# Patient Record
Sex: Female | Born: 1950
Health system: Southern US, Community
[De-identification: ages and names within clinical notes are randomized; demographics above are authoritative.]

## PROBLEM LIST (undated history)

## (undated) DIAGNOSIS — E559 Vitamin D deficiency, unspecified: Secondary | ICD-10-CM

## (undated) DIAGNOSIS — I10 Essential (primary) hypertension: Secondary | ICD-10-CM

## (undated) DIAGNOSIS — R7303 Prediabetes: Secondary | ICD-10-CM

## (undated) DIAGNOSIS — E669 Obesity, unspecified: Secondary | ICD-10-CM

## (undated) HISTORY — PX: TUBAL LIGATION: SHX77

## (undated) HISTORY — PX: CARPAL TUNNEL RELEASE: SHX101

## (undated) HISTORY — DX: Obesity, unspecified: E66.9

## (undated) HISTORY — DX: Prediabetes: R73.03

## (undated) HISTORY — PX: KNEE ARTHROSCOPY: SUR90

## (undated) HISTORY — DX: Vitamin D deficiency, unspecified: E55.9

## (undated) HISTORY — DX: Essential (primary) hypertension: I10

---

## 1997-03-25 ENCOUNTER — Ambulatory Visit (HOSPITAL_COMMUNITY): Admission: RE | Admit: 1997-03-25 | Discharge: 1997-03-25 | Payer: Self-pay | Admitting: Internal Medicine

## 1997-08-08 ENCOUNTER — Ambulatory Visit (HOSPITAL_COMMUNITY): Admission: RE | Admit: 1997-08-08 | Discharge: 1997-08-08 | Payer: Self-pay | Admitting: Internal Medicine

## 1997-09-20 ENCOUNTER — Emergency Department (HOSPITAL_COMMUNITY): Admission: EM | Admit: 1997-09-20 | Discharge: 1997-09-20 | Payer: Self-pay | Admitting: Emergency Medicine

## 1997-09-20 ENCOUNTER — Encounter: Payer: Self-pay | Admitting: Emergency Medicine

## 1998-02-20 ENCOUNTER — Emergency Department (HOSPITAL_COMMUNITY): Admission: EM | Admit: 1998-02-20 | Discharge: 1998-02-20 | Payer: Self-pay | Admitting: Emergency Medicine

## 1999-11-09 ENCOUNTER — Ambulatory Visit (HOSPITAL_COMMUNITY): Admission: RE | Admit: 1999-11-09 | Discharge: 1999-11-09 | Payer: Self-pay | Admitting: Internal Medicine

## 1999-11-09 ENCOUNTER — Encounter: Payer: Self-pay | Admitting: Internal Medicine

## 2000-04-15 ENCOUNTER — Emergency Department (HOSPITAL_COMMUNITY): Admission: EM | Admit: 2000-04-15 | Discharge: 2000-04-16 | Payer: Self-pay | Admitting: Emergency Medicine

## 2000-06-19 ENCOUNTER — Ambulatory Visit (HOSPITAL_BASED_OUTPATIENT_CLINIC_OR_DEPARTMENT_OTHER): Admission: RE | Admit: 2000-06-19 | Discharge: 2000-06-19 | Payer: Self-pay | Admitting: Orthopedic Surgery

## 2000-07-22 ENCOUNTER — Ambulatory Visit (HOSPITAL_COMMUNITY): Admission: RE | Admit: 2000-07-22 | Discharge: 2000-07-22 | Payer: Self-pay | Admitting: Gastroenterology

## 2000-10-01 ENCOUNTER — Encounter: Admission: RE | Admit: 2000-10-01 | Discharge: 2000-10-01 | Payer: Self-pay | Admitting: Family Medicine

## 2000-10-01 ENCOUNTER — Encounter: Payer: Self-pay | Admitting: Family Medicine

## 2000-10-07 ENCOUNTER — Other Ambulatory Visit: Admission: RE | Admit: 2000-10-07 | Discharge: 2000-10-07 | Payer: Self-pay | Admitting: Family Medicine

## 2000-11-11 ENCOUNTER — Encounter: Payer: Self-pay | Admitting: Family Medicine

## 2000-11-11 ENCOUNTER — Ambulatory Visit (HOSPITAL_COMMUNITY): Admission: RE | Admit: 2000-11-11 | Discharge: 2000-11-11 | Payer: Self-pay | Admitting: Family Medicine

## 2001-10-30 ENCOUNTER — Other Ambulatory Visit: Admission: RE | Admit: 2001-10-30 | Discharge: 2001-10-30 | Payer: Self-pay | Admitting: Family Medicine

## 2001-11-02 ENCOUNTER — Encounter: Payer: Self-pay | Admitting: Family Medicine

## 2001-11-02 ENCOUNTER — Encounter: Admission: RE | Admit: 2001-11-02 | Discharge: 2001-11-02 | Payer: Self-pay | Admitting: Family Medicine

## 2002-01-08 ENCOUNTER — Ambulatory Visit (HOSPITAL_COMMUNITY): Admission: RE | Admit: 2002-01-08 | Discharge: 2002-01-08 | Payer: Self-pay | Admitting: Family Medicine

## 2002-01-08 ENCOUNTER — Encounter: Payer: Self-pay | Admitting: Family Medicine

## 2002-09-04 ENCOUNTER — Emergency Department (HOSPITAL_COMMUNITY): Admission: EM | Admit: 2002-09-04 | Discharge: 2002-09-04 | Payer: Self-pay | Admitting: Emergency Medicine

## 2002-09-04 ENCOUNTER — Encounter: Payer: Self-pay | Admitting: Emergency Medicine

## 2003-02-22 ENCOUNTER — Ambulatory Visit (HOSPITAL_COMMUNITY): Admission: RE | Admit: 2003-02-22 | Discharge: 2003-02-22 | Payer: Self-pay | Admitting: Family Medicine

## 2003-07-13 ENCOUNTER — Emergency Department (HOSPITAL_COMMUNITY): Admission: EM | Admit: 2003-07-13 | Discharge: 2003-07-13 | Payer: Self-pay | Admitting: Emergency Medicine

## 2003-07-14 ENCOUNTER — Ambulatory Visit (HOSPITAL_COMMUNITY): Admission: RE | Admit: 2003-07-14 | Discharge: 2003-07-14 | Payer: Self-pay | Admitting: Family Medicine

## 2004-02-02 ENCOUNTER — Emergency Department (HOSPITAL_COMMUNITY): Admission: EM | Admit: 2004-02-02 | Discharge: 2004-02-02 | Payer: Self-pay | Admitting: Emergency Medicine

## 2004-02-23 ENCOUNTER — Other Ambulatory Visit: Admission: RE | Admit: 2004-02-23 | Discharge: 2004-02-23 | Payer: Self-pay | Admitting: Internal Medicine

## 2004-02-23 ENCOUNTER — Ambulatory Visit (HOSPITAL_COMMUNITY): Admission: RE | Admit: 2004-02-23 | Discharge: 2004-02-23 | Payer: Self-pay | Admitting: Internal Medicine

## 2005-03-06 ENCOUNTER — Ambulatory Visit (HOSPITAL_COMMUNITY): Admission: RE | Admit: 2005-03-06 | Discharge: 2005-03-06 | Payer: Self-pay | Admitting: Internal Medicine

## 2005-04-25 ENCOUNTER — Ambulatory Visit: Payer: Self-pay | Admitting: Internal Medicine

## 2005-05-13 ENCOUNTER — Ambulatory Visit: Payer: Self-pay

## 2006-03-19 ENCOUNTER — Ambulatory Visit (HOSPITAL_COMMUNITY): Admission: RE | Admit: 2006-03-19 | Discharge: 2006-03-19 | Payer: Self-pay | Admitting: Internal Medicine

## 2006-06-11 ENCOUNTER — Ambulatory Visit (HOSPITAL_COMMUNITY): Admission: RE | Admit: 2006-06-11 | Discharge: 2006-06-11 | Payer: Self-pay | Admitting: Internal Medicine

## 2006-09-19 ENCOUNTER — Encounter: Admission: RE | Admit: 2006-09-19 | Discharge: 2006-09-19 | Payer: Self-pay | Admitting: Sports Medicine

## 2006-10-03 ENCOUNTER — Encounter: Admission: RE | Admit: 2006-10-03 | Discharge: 2006-10-03 | Payer: Self-pay | Admitting: Sports Medicine

## 2007-03-20 ENCOUNTER — Ambulatory Visit (HOSPITAL_COMMUNITY): Admission: RE | Admit: 2007-03-20 | Discharge: 2007-03-20 | Payer: Self-pay | Admitting: Internal Medicine

## 2007-06-10 ENCOUNTER — Ambulatory Visit (HOSPITAL_COMMUNITY): Admission: RE | Admit: 2007-06-10 | Discharge: 2007-06-10 | Payer: Self-pay | Admitting: Internal Medicine

## 2008-01-26 ENCOUNTER — Ambulatory Visit (HOSPITAL_COMMUNITY): Admission: RE | Admit: 2008-01-26 | Discharge: 2008-01-26 | Payer: Self-pay | Admitting: Internal Medicine

## 2008-06-22 ENCOUNTER — Ambulatory Visit (HOSPITAL_COMMUNITY): Admission: RE | Admit: 2008-06-22 | Discharge: 2008-06-22 | Payer: Self-pay | Admitting: Internal Medicine

## 2009-05-26 ENCOUNTER — Ambulatory Visit (HOSPITAL_COMMUNITY): Admission: RE | Admit: 2009-05-26 | Discharge: 2009-05-26 | Payer: Self-pay | Admitting: Internal Medicine

## 2009-11-14 ENCOUNTER — Ambulatory Visit (HOSPITAL_COMMUNITY): Admission: RE | Admit: 2009-11-14 | Discharge: 2009-11-14 | Payer: Self-pay | Admitting: Internal Medicine

## 2010-01-28 ENCOUNTER — Encounter: Payer: Self-pay | Admitting: Sports Medicine

## 2010-01-28 ENCOUNTER — Encounter: Payer: Self-pay | Admitting: Family Medicine

## 2010-05-25 NOTE — Procedures (Signed)
[place]  Patient:    Brittney Gray, Brittney Gray                       MRN: [id number] Proc. Date: 07/22/00 Adm. Date:  [id number] Disc. Date: [id number] Attending:  Ronne Binning CC:         Geraldo Pitter, M.D.   Procedure Report  DATE OF BIRTH:  1950/05/07  PROCEDURE PERFORMED:  Colonoscopy  ENDOSCOPIST:  Anselmo Rod, M.D.  INSTRUMENT USED:  Olympus video colonoscope (pediatric)  INDICATION FOR PROCEDURE:  Family history of colon cancer (father) in a 59 year old African-American female rule out colonic polyps, masses, hemorrhoids, etc.  PREPROCEDURE PREPARATION:  Informed consent was procured from the patient. The patient was fasted for eight hours prior to the procedure and prepped with a bottle of magnesium citrate and gallon of NuLytely the night prior to the procedure.  PREPROCEDURE PHYSICAL:  VITAL SIGNS:  The patient had stable vital signs.  NECK:  Supple.  CHEST:  Clear to auscultation.  S1 and S2 regular.  ABDOMEN:  Soft with normal bowel sounds.  DESCRIPTION OF PROCEDURE:  The patient was placed in the left lateral decubitus position and sedated with 70 mg of Demerol and 5 mg of Versed intravenously.  Once the patient was adequately sedated and maintained on low flow oxygen and continuous cardiac monitoring, the Olympus video colonoscope was advanced from the rectum to the cecum without difficulty.  The patient had an excellent prep except for a few early left sided diverticula.  No other abnormalities were seen.  No masses, polyps, erosions or ulcerations were present.  IMPRESSION:  Healthy appearing colon except for a few early left sided diverticula.  RECOMMENDATIONS: 1. A high fiber diet has been recommended for the patient. 2. Repeat colorectal cancer screening is recommended in the next five years    unless the patient was to develop any abnormal symptoms in the interim. 3. Outpatient follow up is  advised on a p.r.n. basis. DD:  07/22/00 TD:  07/22/00 Job: [id number]

## 2010-05-25 NOTE — Op Note (Signed)
[place]  Patient:    Brittney Gray, Brittney Gray                       MRN: [id number] Proc. Date: 06/19/00 Adm. Date:  [id number] Disc. Date: [id number] Attending:  Carren Rang K                           Operative Report  PREOPERATIVE DIAGNOSIS:  Carpal tunnel syndrome, right hand.  POSTOPERATIVE DIAGNOSIS:  Carpal tunnel syndrome, right hand.  OPERATION:  Decompression right median nerve.  SURGEON:  Nicki Reaper, M.D.  ANESTHESIA:  Forearm-based IV regional.  ANESTHESIOLOGIST:  Janetta Hora. Gelene Mink, M.D.  INDICATIONS:  The patient is a 61 year old female with a history of carpal tunnel syndrome.  EMG nerve conduction is positive, which has not responded to conservative treatment.  DESCRIPTION OF PROCEDURE:  The patient was brought to the operating room where a forearm-based IV regional anesthetic was carried out without difficulty. She was prepared and draped using Betadine scrub and solution with the right arm free.  A longitudinal incision was made in the palm and carried down through the subcutaneous tissue. Bleeders were electrocauterized.  The palmar fascia was split and superficial palmar arch identified.  The flexor tendon to the ring little finger identified to the ulnar side of the median nerve.  The carpal retinaculum was incised with sharp dissection.  A right-angle and Sewall retractor were placed between the skin and forearm fascia.  The fascia was released for approximately 3 cm proximal to the wrist crease under direct vision.  The canal was explored and no further lesions were identified. Tenosynovial tissue was moderately thickened.  The area of the hyperemia of the nerve was identified.  The wound was irrigated.  The skin was closed with interrupted 5-0 nylon sutures.  A sterile compressive dressing and splint was applied.  The patient tolerated the procedure well and was taken to the recovery room for observation in  satisfactory condition.  She is discharged home to return to the [facility] [place] in one week on Vicodin and Keflex. DD:  06/19/00 TD:  06/19/00 Job: [id number]

## 2011-04-11 ENCOUNTER — Other Ambulatory Visit (HOSPITAL_COMMUNITY)
Admission: RE | Admit: 2011-04-11 | Discharge: 2011-04-11 | Disposition: A | Discharge status: Home / Self Care | Payer: BC Managed Care – PPO | Source: Ambulatory Visit | Attending: Internal Medicine | Admitting: Internal Medicine

## 2011-04-11 DIAGNOSIS — Z01419 Encounter for gynecological examination (general) (routine) without abnormal findings: Secondary | ICD-10-CM | POA: Insufficient documentation

## 2011-04-11 LAB — HM PAP SMEAR: HM Pap smear: NORMAL

## 2011-04-17 ENCOUNTER — Other Ambulatory Visit (HOSPITAL_COMMUNITY): Payer: Self-pay | Admitting: Internal Medicine

## 2011-04-17 DIAGNOSIS — Z1231 Encounter for screening mammogram for malignant neoplasm of breast: Secondary | ICD-10-CM

## 2011-04-17 DIAGNOSIS — Z13820 Encounter for screening for osteoporosis: Secondary | ICD-10-CM

## 2011-05-13 ENCOUNTER — Ambulatory Visit (HOSPITAL_COMMUNITY)
Admission: RE | Admit: 2011-05-13 | Discharge: 2011-05-13 | Disposition: A | Discharge status: Home / Self Care | Payer: BC Managed Care – PPO | Source: Ambulatory Visit | Attending: Internal Medicine | Admitting: Internal Medicine

## 2011-05-13 DIAGNOSIS — Z1231 Encounter for screening mammogram for malignant neoplasm of breast: Secondary | ICD-10-CM | POA: Insufficient documentation

## 2011-05-13 DIAGNOSIS — Z780 Asymptomatic menopausal state: Secondary | ICD-10-CM | POA: Insufficient documentation

## 2011-05-13 DIAGNOSIS — Z13820 Encounter for screening for osteoporosis: Secondary | ICD-10-CM | POA: Insufficient documentation

## 2011-05-13 LAB — HM DEXA SCAN: HM Dexa Scan: NORMAL

## 2011-05-13 LAB — HM MAMMOGRAPHY: HM Mammogram: NORMAL

## 2012-12-02 ENCOUNTER — Ambulatory Visit: Payer: Self-pay | Admitting: Internal Medicine

## 2012-12-02 ENCOUNTER — Encounter: Payer: Self-pay | Admitting: Internal Medicine

## 2012-12-02 VITALS — BP 158/98 | HR 104 | Temp 98.2°F | Resp 20 | Wt 207.2 lb

## 2012-12-02 DIAGNOSIS — J019 Acute sinusitis, unspecified: Secondary | ICD-10-CM

## 2012-12-02 DIAGNOSIS — J0410 Acute tracheitis without obstruction: Secondary | ICD-10-CM

## 2012-12-02 DIAGNOSIS — J029 Acute pharyngitis, unspecified: Secondary | ICD-10-CM

## 2012-12-02 MED ORDER — AZITHROMYCIN 250 MG PO TABS: ORAL_TABLET | ORAL | Status: AC

## 2012-12-02 MED ORDER — HYDROCODONE-ACETAMINOPHEN 7.5-325 MG PO TABS: ORAL_TABLET | ORAL | Status: DC

## 2012-12-02 MED ORDER — PREDNISONE 20 MG PO TABS: 20.0000 mg | ORAL_TABLET | ORAL | Status: DC

## 2012-12-02 NOTE — Patient Instructions (Signed)
Acute Bronchitis Bronchitis is inflammation of the airways that extend from the windpipe into the lungs (bronchi). The inflammation often causes mucus to develop. This leads to a cough, which is the most common symptom of bronchitis.  In acute bronchitis, the condition usually develops suddenly and goes away over time, usually in a couple weeks. Smoking, allergies, and asthma can make bronchitis worse. Repeated episodes of bronchitis may cause further lung problems.  CAUSES Acute bronchitis is most often caused by the same virus that causes a cold. The virus can spread from person to person (contagious).  SIGNS AND SYMPTOMS   Cough.   Fever.   Coughing up mucus.   Body aches.   Chest congestion.   Chills.   Shortness of breath.   Sore throat.  DIAGNOSIS  Acute bronchitis is usually diagnosed through a physical exam. Tests, such as chest X-rays, are sometimes done to rule out other conditions.  TREATMENT  Acute bronchitis usually goes away in a couple weeks. Often times, no medical treatment is necessary. Medicines are sometimes given for relief of fever or cough. Antibiotics are usually not needed but may be prescribed in certain situations. In some cases, an inhaler may be recommended to help reduce shortness of breath and control the cough. A cool mist vaporizer may also be used to help thin bronchial secretions and make it easier to clear the chest.  HOME CARE INSTRUCTIONS  Get plenty of rest.   Drink enough fluids to keep your urine clear or pale yellow (unless you have a medical condition that requires fluid restriction). Increasing fluids may help thin your secretions and will prevent dehydration.   Only take over-the-counter or prescription medicines as directed by your health care provider.   Avoid smoking and secondhand smoke. Exposure to cigarette smoke or irritating chemicals will make bronchitis worse. If you are a smoker, consider using nicotine gum or skin  patches to help control withdrawal symptoms. Quitting smoking will help your lungs heal faster.   Reduce the chances of another bout of acute bronchitis by washing your hands frequently, avoiding people with cold symptoms, and trying not to touch your hands to your mouth, nose, or eyes.   Follow up with your health care provider as directed.  SEEK MEDICAL CARE IF: Your symptoms do not improve after 1 week of treatment.  SEEK IMMEDIATE MEDICAL CARE IF:  You develop an increased fever or chills.   You have chest pain.   You have severe shortness of breath.  You have bloody sputum.   You develop dehydration.  You develop fainting.  You develop repeated vomiting.  You develop a severe headache. MAKE SURE YOU:   Understand these instructions.  Will watch your condition.  Will get help right away if you are not doing well or get worse. Document Released: 02/01/2004 Document Revised: 08/26/2012 Document Reviewed: 06/16/2012 Exit[employer] Patient Information 2014 [employer].  Sinusitis Sinusitis is redness, soreness, and swelling (inflammation) of the paranasal sinuses. Paranasal sinuses are air pockets within the bones of your face (beneath the eyes, the middle of the forehead, or above the eyes). In healthy paranasal sinuses, mucus is able to drain out, and air is able to circulate through them by way of your nose. However, when your paranasal sinuses are inflamed, mucus and air can become trapped. This can allow bacteria and other germs to grow and cause infection. Sinusitis can develop quickly and last only a short time (acute) or continue over a long period (chronic). Sinusitis that   lasts for more than 12 weeks is considered chronic.  CAUSES  Causes of sinusitis include:  Allergies.  Structural abnormalities, such as displacement of the cartilage that separates your nostrils (deviated septum), which can decrease the air flow through your nose and sinuses and affect sinus  drainage.  Functional abnormalities, such as when the small hairs (cilia) that line your sinuses and help remove mucus do not work properly or are not present. SYMPTOMS  Symptoms of acute and chronic sinusitis are the same. The primary symptoms are pain and pressure around the affected sinuses. Other symptoms include:  Upper toothache.  Earache.  Headache.  Bad breath.  Decreased sense of smell and taste.  A cough, which worsens when you are lying flat.  Fatigue.  Fever.  Thick drainage from your nose, which often is green and may contain pus (purulent).  Swelling and warmth over the affected sinuses. DIAGNOSIS  Your caregiver will perform a physical exam. During the exam, your caregiver may:  Look in your nose for signs of abnormal growths in your nostrils (nasal polyps).  Tap over the affected sinus to check for signs of infection.  View the inside of your sinuses (endoscopy) with a special imaging device with a light attached (endoscope), which is inserted into your sinuses. If your caregiver suspects that you have chronic sinusitis, one or more of the following tests may be recommended:  Allergy tests.  Nasal culture A sample of mucus is taken from your nose and sent to a lab and screened for bacteria.  Nasal cytology A sample of mucus is taken from your nose and examined by your caregiver to determine if your sinusitis is related to an allergy. TREATMENT  Most cases of acute sinusitis are related to a viral infection and will resolve on their own within 10 days. Sometimes medicines are prescribed to help relieve symptoms (pain medicine, decongestants, nasal steroid sprays, or saline sprays).  However, for sinusitis related to a bacterial infection, your caregiver will prescribe antibiotic medicines. These are medicines that will help kill the bacteria causing the infection.  Rarely, sinusitis is caused by a fungal infection. In theses cases, your caregiver will  prescribe antifungal medicine. For some cases of chronic sinusitis, surgery is needed. Generally, these are cases in which sinusitis recurs more than 3 times per year, despite other treatments. HOME CARE INSTRUCTIONS   Drink plenty of water. Water helps thin the mucus so your sinuses can drain more easily.  Use a humidifier.  Inhale steam 3 to 4 times a day (for example, sit in the bathroom with the shower running).  Apply a warm, moist washcloth to your face 3 to 4 times a day, or as directed by your caregiver.  Use saline nasal sprays to help moisten and clean your sinuses.  Take over-the-counter or prescription medicines for pain, discomfort, or fever only as directed by your caregiver. SEEK IMMEDIATE MEDICAL CARE IF:  You have increasing pain or severe headaches.  You have nausea, vomiting, or drowsiness.  You have swelling around your face.  You have vision problems.  You have a stiff neck.  You have difficulty breathing. MAKE SURE YOU:   Understand these instructions.  Will watch your condition.  Will get help right away if you are not doing well or get worse. Document Released: 12/24/2004 Document Revised: 03/18/2011 Document Reviewed: 01/08/2011 Exit[employer] Patient Information 2014 [employer].  

## 2012-12-02 NOTE — Progress Notes (Signed)
   Subjective:    Patient ID: Brittney Gray, female    DOB: Jun 06, 1950, 62 y.o.   MRN: [id number]  HPI Comments: Sick since last night with ST, sinus pressure, congestion, runny nose - clear, cough clear to yellow      Review of Systems  Constitutional: Negative for fever, chills and fatigue.  HENT: Positive for congestion, postnasal drip, rhinorrhea, sinus pressure, sneezing, sore throat and trouble swallowing. Negative for ear pain, hearing loss, mouth sores, tinnitus and voice change.   Eyes: Negative.   Respiratory: Positive for cough, chest tightness and wheezing. Negative for choking, shortness of breath and stridor.   Cardiovascular: Negative.   Gastrointestinal: Negative.   Endocrine: Negative.   Genitourinary: Negative.   Musculoskeletal: Positive for myalgias.  Skin: Negative.  Negative for pallor and rash.       Objective:   Physical Exam  Constitutional: She is oriented to person, place, and time. She appears well-developed and well-nourished.  HENT:  Head: Normocephalic.  Right Ear: External ear normal.  Left Ear: External ear normal.  Mouth/Throat: Oropharyngeal exudate present.  Eyes: Conjunctivae are normal. Pupils are equal, round, and reactive to light.  Neck: Normal range of motion. Neck supple. No thyromegaly present.  Cardiovascular: Normal rate and regular rhythm.   No murmur heard. Pulmonary/Chest: Effort normal. No respiratory distress. She has no wheezes. She has rales.  Abdominal: Soft.  Musculoskeletal: Normal range of motion.  Lymphadenopathy:    She has no cervical adenopathy.  Neurological: She is alert and oriented to person, place, and time.  Skin: Skin is warm and dry. No rash noted. She is not diaphoretic. No erythema.  Psychiatric: She has a normal mood and affect.          Assessment & Plan:  1. Acute sinusitis, unspecified  - azithromycin (ZITHROMAX) 250 MG tablet; Take 2 tablets (500 mg) on  Day 1,  followed by 1 tablet (250  mg) once daily on Days 2 through 5.  Dispense: 6 each; Refill: 1 - predniSONE (DELTASONE) 20 MG tablet; Take 1 tablet (20 mg total) by mouth See admin instructions. 1 tab 3 x day for 3 days, then 1 tab 2 x day for 3 days, then 1 tab 1 x day for 5 days  Dispense: 20 tablet; Refill: 0 - HYDROcodone-acetaminophen (NORCO) 7.5-325 MG per tablet; 1/2 to 1 tablet every 3 to 4 hours as needed for cough or pain  Dispense: 30 tablet; Refill: 0  2. Acute pharyngitis  3. Acute tracheitis without mention of obstruction  - HYDROcodone-acetaminophen (NORCO) 7.5-325 MG per tablet; 1/2 to 1 tablet every 3 to 4 hours as needed for cough or pain  Dispense: 30 tablet; Refill: 0

## 2013-01-03 DIAGNOSIS — I10 Essential (primary) hypertension: Secondary | ICD-10-CM | POA: Insufficient documentation

## 2013-01-03 DIAGNOSIS — E1169 Type 2 diabetes mellitus with other specified complication: Secondary | ICD-10-CM | POA: Insufficient documentation

## 2013-01-03 DIAGNOSIS — E559 Vitamin D deficiency, unspecified: Secondary | ICD-10-CM | POA: Insufficient documentation

## 2013-01-03 NOTE — Progress Notes (Signed)
Patient ID: Brittney Gray, female   DOB: [birth year], 62 y.o.   MRN: [id number]   This very nice 62 y.o.DBF presents for 3 month follow up with labile Hypertension, Hyperlipidemia, Pre-Diabetes and Vitamin D Deficiency.    HTN predate back to the early 2000's and compliance is somewhat suspect. BP has been controlled at home. Today's BP: 130/80 mmHg in the Left arm and is rechecked on the Rt arm at 142/96. Patient denies any cardiac type chest pain, palpitations, dyspnea/orthopnea/PND, dizziness, claudication, or dependent edema. Patient denies myalgias or other med SE's   Hyperlipidemia is controlled with diet. Last Cholesterol was 150, Triglycerides were 142, HDL 47 and LDL 75 in may - all at goal. Patient was treated for an E.coli UTI in July.   Also, the patient has history of PreDiabetes/insulin resistance with last A1c of 5.8% in May. She does have Morbid Obesity with a BMI of 40. Patient denies any symptoms of reactive hypoglycemia, diabetic polys, paresthesias or visual blurring.   Further, Patient has history of Vitamin D Deficiency with last vitamin D of 17 in July, but she never started her recommended Vit D supplements.     Medication List       This list is accurate as of: 01/04/13  9:15 PM.  Always use your most recent med list.               Amlodipine 5mg  --- d/c today      bisoprolol-hydrochlorothiazide 5-6.25 MG --- start today  Commonly known as:  ZIAC  1 tablet each morning for Blood Pressure        Vitamin D3 5000 UNITS Caps   ------------------  Not Taking  Take 2 gelcaps (= 10,000 units daily)          No Known Allergies  PMHx:   Past Medical History  Diagnosis Date  . Hypertension   . Pre-diabetes   . Vitamin D deficiency   . Obesity     FHx:    Reviewed / unchanged  SHx:    Reviewed / unchanged  Systems Review: Constitutional: Denies fever, chills, wt changes, headaches, insomnia, fatigue, night sweats, change in appetite. Eyes: Denies  redness, blurred vision, diplopia, discharge, itchy, watery eyes.  ENT: Denies discharge, congestion, post nasal drip, epistaxis, sore throat, earache, hearing loss, dental pain, tinnitus, vertigo, sinus pain, snoring.  CV: Denies chest pain, palpitations, irregular heartbeat, syncope, dyspnea, diaphoresis, orthopnea, PND, claudication, edema. Respiratory: denies cough, dyspnea, DOE, pleurisy, hoarseness, laryngitis, wheezing.  Gastrointestinal: Denies dysphagia, odynophagia, heartburn, reflux, water brash, abdominal pain or cramps, nausea, vomiting, bloating, diarrhea, constipation, hematemesis, melena, hematochezia,  or hemorrhoids. Genitourinary: Denies dysuria, frequency, urgency, nocturia, hesitancy, discharge, hematuria, flank pain. Musculoskeletal: Denies arthralgias, myalgias, stiffness, jt. swelling, pain, limp, strain/sprain.  Skin: Denies pruritus, rash, hives, warts, acne, eczema, change in skin lesion(s). Neuro: No weakness, tremor, incoordination, spasms, paresthesia, or pain. Psychiatric: Denies confusion, memory loss, or sensory loss. Endo: Denies change in weight, skin, hair change.  Heme/Lymph: No excessive bleeding, bruising, orenlarged lymph nodes.  BP: 130/80  Pulse: 92  Temp: 97.5 F (36.4 C)  Resp: 18      On Exam: Appears over  nourished and  in no distress. Eyes: PERRLA, EOMs, conjunctiva no swelling or erythema. Sinuses: No frontal/maxillary tenderness ENT/Mouth: EAC's clear, TM's nl w/o erythema, bulging. Nares clear w/o erythema, swelling, exudates. Oropharynx clear without erythema or exudates. Oral hygiene is good. Tongue normal, non obstructing. Hearing intact.  Neck: Supple. Thyroid nl. Set designer  2+/2+ without bruits, nodes or JVD. Chest: Respirations nl with BS clear & equal w/o rales, rhonchi, wheezing or stridor.  Cor: Heart sounds normal w/ regular rate and rhythm without sig. murmurs, gallops, clicks, or rubs. Peripheral pulses normal and equal  without  edema.  Abdomen: Soft & bowel sounds normal. Non-tender w/o guarding, rebound, hernias, masses, or organomegaly.  Lymphatics: Unremarkable.  Musculoskeletal: Full ROM all peripheral extremities, joint stability, 5/5 strength, and normal gait.  Skin: Warm, dry without exposed rashes, lesions, ecchymosis apparent.  Neuro: Cranial nerves intact, reflexes equal bilaterally. Sensory-motor testing grossly intact. Tendon reflexes grossly intact.  Pysch: Alert & oriented x 3. Affect is indifferent. Insight and judgement seem limited to the levity of her medical issues.. No ideations.  Assessment and Plan:  1. Hypertension - Continue monitor blood pressure at home. Continue diet/meds same.  2. Hyperlipidemia - Continue diet/meds, exercise,& lifestyle modifications. Continue monitor periodic cholesterol/liver & renal functions   3. Pre-diabetes - Continue diet, exercise, lifestyle modifications. Monitor appropriate labs.  4. Vitamin D Deficiency - Continue supplementation.  5. Morbid Obesity  6. UTI, recent - needs f/u  Recommended regular exercise, BP monitoring, weight control, and discussed med and SE's. Recommended labs to assess and monitor clinical status. Further disposition pending results of labs.

## 2013-01-03 NOTE — Patient Instructions (Addendum)
Continue diet & medications same as discussed.   Further disposition pending lab results.    STOP AMLODIPINE  START NEW BP MED + ZIAC (Bisoprolol/Hctz) 5/6.25  Take 1 tablet each morning   Hypertension As your heart beats, it forces blood through your arteries. This force is your blood pressure. If the pressure is too high, it is called hypertension (HTN) or high blood pressure. HTN is dangerous because you may have it and not know it. High blood pressure may mean that your heart has to work harder to pump blood. Your arteries may be narrow or stiff. The extra work puts you at risk for heart disease, stroke, and other problems.  Blood pressure consists of two numbers, a higher number over a lower, 110/72, for example. It is stated as "110 over 72." The ideal is below 120 for the top number (systolic) and under 80 for the bottom (diastolic). Write down your blood pressure today. You should pay close attention to your blood pressure if you have certain conditions such as:  Heart failure.  Prior heart attack.  Diabetes  Chronic kidney disease.  Prior stroke.  Multiple risk factors for heart disease. To see if you have HTN, your blood pressure should be measured while you are seated with your arm held at the level of the heart. It should be measured at least twice. A one-time elevated blood pressure reading (especially in the Emergency Department) does not mean that you need treatment. There may be conditions in which the blood pressure is different between your right and left arms. It is important to see your caregiver soon for a recheck. Most people have essential hypertension which means that there is not a specific cause. This type of high blood pressure may be lowered by changing lifestyle factors such as:  Stress.  Smoking.  Lack of exercise.  Excessive weight.  Drug/tobacco/alcohol use.  Eating less salt. Most people do not have symptoms from high blood pressure until it  has caused damage to the body. Effective treatment can often prevent, delay or reduce that damage. TREATMENT  When a cause has been identified, treatment for high blood pressure is directed at the cause. There are a large number of medications to treat HTN. These fall into several categories, and your caregiver will help you select the medicines that are best for you. Medications may have side effects. You should review side effects with your caregiver. If your blood pressure stays high after you have made lifestyle changes or started on medicines,   Your medication(s) may need to be changed.  Other problems may need to be addressed.  Be certain you understand your prescriptions, and know how and when to take your medicine.  Be sure to follow up with your caregiver within the time frame advised (usually within two weeks) to have your blood pressure rechecked and to review your medications.  If you are taking more than one medicine to lower your blood pressure, make sure you know how and at what times they should be taken. Taking two medicines at the same time can result in blood pressure that is too low. SEEK IMMEDIATE MEDICAL CARE IF:  You develop a severe headache, blurred or changing vision, or confusion.  You have unusual weakness or numbness, or a faint feeling.  You have severe chest or abdominal pain, vomiting, or breathing problems. MAKE SURE YOU:   Understand these instructions.  Will watch your condition.  Will get help right away if you are not  doing well or get worse. Document Released: 12/24/2004 Document Revised: 03/18/2011 Document Reviewed: 08/14/2007 [facility] Patient Information 2014 [place], [place].  Diabetes and Exercise Exercising regularly is important. It is not just about losing weight. It has many health benefits, such as:  Improving your overall fitness, flexibility, and endurance.  Increasing your bone density.  Helping with weight  control.  Decreasing your body fat.  Increasing your muscle strength.  Reducing stress and tension.  Improving your overall health. People with diabetes who exercise gain additional benefits because exercise:  Reduces appetite.  Improves the body's use of blood sugar (glucose).  Helps lower or control blood glucose.  Decreases blood pressure.  Helps control blood lipids (such as cholesterol and triglycerides).  Improves the body's use of the hormone insulin by:  Increasing the body's insulin sensitivity.  Reducing the body's insulin needs.  Decreases the risk for heart disease because exercising:  Lowers cholesterol and triglycerides levels.  Increases the levels of good cholesterol (such as high-density lipoproteins [HDL]) in the body.  Lowers blood glucose levels. YOUR ACTIVITY PLAN  Choose an activity that you enjoy and set realistic goals. Your health care provider or diabetes educator can help you make an activity plan that works for you. You can break activities into 2 or 3 sessions throughout the day. Doing so is as good as one long session. Exercise ideas include:  Taking the dog for a walk.  Taking the stairs instead of the elevator.  Dancing to your favorite song.  Doing your favorite exercise with a friend. RECOMMENDATIONS FOR EXERCISING WITH TYPE 1 OR TYPE 2 DIABETES   Check your blood glucose before exercising. If blood glucose levels are greater than 240 mg/dL, check for urine ketones. Do not exercise if ketones are present.  Avoid injecting insulin into areas of the body that are going to be exercised. For example, avoid injecting insulin into:  The arms when playing tennis.  The legs when jogging.  Keep a record of:  Food intake before and after you exercise.  Expected peak times of insulin action.  Blood glucose levels before and after you exercise.  The type and amount of exercise you have done.  Review your records with your health  care provider. Your health care provider will help you to develop guidelines for adjusting food intake and insulin amounts before and after exercising.  If you take insulin or oral hypoglycemic agents, watch for signs and symptoms of hypoglycemia. They include:  Dizziness.  Shaking.  Sweating.  Chills.  Confusion.  Drink plenty of water while you exercise to prevent dehydration or heat stroke. Body water is lost during exercise and must be replaced.  Talk to your health care provider before starting an exercise program to make sure it is safe for you. Remember, almost any type of activity is better than none. Document Released: 03/16/2003 Document Revised: 08/26/2012 Document Reviewed: 06/02/2012 [facility] Patient Information 2014 [place], [place].Vitamin D Deficiency Vitamin D is an important vitamin that your body needs. Having too little of it in your body is called a deficiency. A very bad deficiency can make your bones soft and can cause a condition called rickets.  Vitamin D is important to your body for different reasons, such as:   It helps your body absorb 2 minerals called calcium and phosphorus.  It helps make your bones healthy.  It may prevent some diseases, such as diabetes and multiple sclerosis.  It helps your muscles and heart. You can get vitamin D  in several ways. It is a natural part of some foods. The vitamin is also added to some dairy products and cereals. Some people take vitamin D supplements. Also, your body makes vitamin D when you are in the sun. It changes the sun's rays into a form of the vitamin that your body can use. CAUSES   Not eating enough foods that contain vitamin D.  Not getting enough sunlight.  Having certain digestive system diseases that make it hard to absorb vitamin D. These diseases include Crohn's disease, chronic pancreatitis, and cystic fibrosis.  Having a surgery in which part of the stomach or small intestine is removed.  Being  obese. Fat cells pull vitamin D out of your blood. That means that obese people may not have enough vitamin D left in their blood and in other body tissues.  Having chronic kidney or liver disease. RISK FACTORS Risk factors are things that make you more likely to develop a vitamin D deficiency. They include:  Being older.  Not being able to get outside very much.  Living in a nursing home.  Having had broken bones.  Having weak or thin bones (osteoporosis).  Having a disease or condition that changes how your body absorbs vitamin D.  Having dark skin.  Some medicines such as seizure medicines or steroids.  Being overweight or obese. SYMPTOMS Mild cases of vitamin D deficiency may not have any symptoms. If you have a very bad case, symptoms may include:  Bone pain.  Muscle pain.  Falling often.  Broken bones caused by a minor injury, due to osteoporosis. DIAGNOSIS A blood test is the best way to tell if you have a vitamin D deficiency. TREATMENT Vitamin D deficiency can be treated in different ways. Treatment for vitamin D deficiency depends on what is causing it. Options include:  Taking vitamin D supplements.  Taking a calcium supplement. Your caregiver will suggest what dose is best for you. HOME CARE INSTRUCTIONS  Take any supplements that your caregiver prescribes. Follow the directions carefully. Take only the suggested amount.  Have your blood tested 2 months after you start taking supplements.  Eat foods that contain vitamin D. Healthy choices include:  Fortified dairy products, cereals, or juices. Fortified means vitamin D has been added to the food. Check the label on the package to be sure.  Fatty fish like salmon or trout.  Eggs.  Oysters.  Do not use a tanning bed.  Keep your weight at a healthy level. Lose weight if you need to.  Keep all follow-up appointments. Your caregiver will need to perform blood tests to make sure your vitamin D  deficiency is going away. SEEK MEDICAL CARE IF:  You have any questions about your treatment.  You continue to have symptoms of vitamin D deficiency.  You have nausea or vomiting.  You are constipated.  You feel confused.  You have severe abdominal or back pain. MAKE SURE YOU:  Understand these instructions.  Will watch your condition.  Will get help right away if you are not doing well or get worse. Document Released: 03/18/2011 Document Revised: 04/20/2012 Document Reviewed: 03/18/2011 [facility] Patient Information 2014 [place], [place].  Urinary Tract Infection A urinary tract infection (UTI) can occur any place along the urinary tract. The tract includes the kidneys, ureters, bladder, and urethra. A type of germ called bacteria often causes a UTI. UTIs are often helped with antibiotic medicine.  HOME CARE   If given, take antibiotics as told by your  doctor. Finish them even if you start to feel better.  Drink enough fluids to keep your pee (urine) clear or pale yellow.  Avoid tea, drinks with caffeine, and bubbly (carbonated) drinks.  Pee often. Avoid holding your pee in for a long time.  Pee before and after having sex (intercourse).  Wipe from front to back after you poop (bowel movement) if you are a woman. Use each tissue only once. GET HELP RIGHT AWAY IF:   You have back pain.  You have lower belly (abdominal) pain.  You have chills.  You feel sick to your stomach (nauseous).  You throw up (vomit).  Your burning or discomfort with peeing does not go away.  You have a fever.  Your symptoms are not better in 3 days. MAKE SURE YOU:   Understand these instructions.  Will watch your condition.  Will get help right away if you are not doing well or get worse. Document Released: 06/12/2007 Document Revised: 09/18/2011 Document Reviewed: 07/25/2011 [facility] Patient Information 2014 [place], [place].

## 2013-01-04 ENCOUNTER — Encounter: Payer: Self-pay | Admitting: Internal Medicine

## 2013-01-04 ENCOUNTER — Ambulatory Visit (INDEPENDENT_AMBULATORY_CARE_PROVIDER_SITE_OTHER): Payer: BC Managed Care – PPO | Admitting: Internal Medicine

## 2013-01-04 VITALS — BP 130/80 | HR 92 | Temp 97.5°F | Resp 18 | Wt 205.6 lb

## 2013-01-04 DIAGNOSIS — E559 Vitamin D deficiency, unspecified: Secondary | ICD-10-CM

## 2013-01-04 DIAGNOSIS — R7309 Other abnormal glucose: Secondary | ICD-10-CM

## 2013-01-04 DIAGNOSIS — I10 Essential (primary) hypertension: Secondary | ICD-10-CM

## 2013-01-04 DIAGNOSIS — Z79899 Other long term (current) drug therapy: Secondary | ICD-10-CM

## 2013-01-04 DIAGNOSIS — N3000 Acute cystitis without hematuria: Secondary | ICD-10-CM

## 2013-01-04 DIAGNOSIS — I739 Peripheral vascular disease, unspecified: Secondary | ICD-10-CM

## 2013-01-04 LAB — CBC WITH DIFFERENTIAL/PLATELET
Basophils Absolute: 0 10*3/uL (ref 0.0–0.1)
Basophils Relative: 1 % (ref 0–1)
Eosinophils Absolute: 0.2 10*3/uL (ref 0.0–0.7)
Hemoglobin: 14.1 g/dL (ref 12.0–15.0)
MCH: 28.6 pg (ref 26.0–34.0)
MCHC: 33.3 g/dL (ref 30.0–36.0)
Monocytes Relative: 10 % (ref 3–12)
Neutrophils Relative %: 55 % (ref 43–77)
Platelets: 314 10*3/uL (ref 150–400)
RDW: 14.7 % (ref 11.5–15.5)

## 2013-01-04 LAB — BASIC METABOLIC PANEL WITH GFR
Calcium: 9.3 mg/dL (ref 8.4–10.5)
GFR, Est African American: >89 mL/min
GFR, Est Non African American: >89 mL/min
Glucose, Bld: 126 mg/dL — ABNORMAL HIGH (ref 70–99)
Sodium: 141 mEq/L (ref 135–145)

## 2013-01-04 LAB — HEPATIC FUNCTION PANEL
ALT: <8 U/L (ref 0–35)
Bilirubin, Direct: 0.1 mg/dL (ref 0.0–0.3)
Total Protein: 6.7 g/dL (ref 6.0–8.3)

## 2013-01-04 LAB — HEMOGLOBIN A1C
Hgb A1c MFr Bld: 6.4 % — ABNORMAL HIGH (ref <5.7)
Mean Plasma Glucose: 137 mg/dL — ABNORMAL HIGH (ref <117)

## 2013-01-04 LAB — MAGNESIUM: Magnesium: 1.7 mg/dL (ref 1.5–2.5)

## 2013-01-04 MED ORDER — VITAMIN D3 125 MCG (5000 UT) PO CAPS: ORAL_CAPSULE | ORAL | Status: DC

## 2013-01-04 MED ORDER — BISOPROLOL-HYDROCHLOROTHIAZIDE 5-6.25 MG PO TABS: ORAL_TABLET | ORAL | Status: DC

## 2013-01-05 LAB — INSULIN, FASTING: Insulin fasting, serum: 177 u[IU]/mL — ABNORMAL HIGH (ref 3–28)

## 2013-01-05 LAB — URINALYSIS, MICROSCOPIC ONLY

## 2013-01-07 ENCOUNTER — Other Ambulatory Visit: Payer: Self-pay | Admitting: Internal Medicine

## 2013-01-07 DIAGNOSIS — E119 Type 2 diabetes mellitus without complications: Secondary | ICD-10-CM

## 2013-01-07 DIAGNOSIS — N182 Chronic kidney disease, stage 2 (mild): Secondary | ICD-10-CM

## 2013-01-07 DIAGNOSIS — E1122 Type 2 diabetes mellitus with diabetic chronic kidney disease: Secondary | ICD-10-CM | POA: Insufficient documentation

## 2013-01-07 DIAGNOSIS — N3000 Acute cystitis without hematuria: Secondary | ICD-10-CM

## 2013-01-07 LAB — URINE CULTURE: Colony Count: >=100000

## 2013-01-07 MED ORDER — CIPROFLOXACIN HCL 250 MG PO TABS: 250.0000 mg | ORAL_TABLET | Freq: Two times a day (BID) | ORAL | Status: AC

## 2013-01-07 MED ORDER — METFORMIN HCL ER (MOD) 500 MG PO TB24: ORAL_TABLET | ORAL | Status: DC

## 2013-01-11 ENCOUNTER — Ambulatory Visit: Payer: Self-pay | Admitting: Physician Assistant

## 2013-01-20 ENCOUNTER — Telehealth: Payer: Self-pay | Admitting: *Deleted

## 2013-01-20 NOTE — Telephone Encounter (Signed)
Patient called and states she has been feeling warm since switched from [place] to [place] 5/6.25. Per Dr Melford Aase, med should not cause warm feeling and may have infection. Patient scheduled OV to evaluate and discuss diabetes med.

## 2013-01-21 ENCOUNTER — Other Ambulatory Visit: Payer: Self-pay | Admitting: Emergency Medicine

## 2013-01-21 DIAGNOSIS — Z1231 Encounter for screening mammogram for malignant neoplasm of breast: Secondary | ICD-10-CM

## 2013-01-22 ENCOUNTER — Ambulatory Visit (HOSPITAL_COMMUNITY)
Admission: RE | Admit: 2013-01-22 | Discharge: 2013-01-22 | Disposition: A | Discharge status: Home / Self Care | Payer: BC Managed Care – PPO | Source: Ambulatory Visit | Attending: Emergency Medicine | Admitting: Emergency Medicine

## 2013-01-22 ENCOUNTER — Encounter: Payer: Self-pay | Admitting: Emergency Medicine

## 2013-01-22 ENCOUNTER — Ambulatory Visit (INDEPENDENT_AMBULATORY_CARE_PROVIDER_SITE_OTHER): Payer: BC Managed Care – PPO | Admitting: Emergency Medicine

## 2013-01-22 VITALS — BP 118/80 | HR 68 | Temp 98.4°F | Resp 18 | Ht 60.0 in | Wt 200.0 lb

## 2013-01-22 DIAGNOSIS — I10 Essential (primary) hypertension: Secondary | ICD-10-CM

## 2013-01-22 DIAGNOSIS — Z1231 Encounter for screening mammogram for malignant neoplasm of breast: Secondary | ICD-10-CM | POA: Insufficient documentation

## 2013-01-22 MED ORDER — AMLODIPINE BESYLATE 10 MG PO TABS: 10.0000 mg | ORAL_TABLET | Freq: Every day | ORAL | Status: DC

## 2013-01-22 NOTE — Patient Instructions (Addendum)
Hypertension Hypertension is another name for high blood pressure. High blood pressure may mean that your heart needs to work harder to pump blood. Blood pressure consists of two numbers, which includes a higher number over a lower number (example: 110/72). HOME CARE   Make lifestyle changes as told by your doctor. This may include weight loss and exercise.  Take your blood pressure medicine every day.  Limit how much salt you use.  Stop smoking if you smoke.  Do not use drugs.  Talk to your doctor if you are using decongestants or birth control pills. These medicines might make blood pressure higher.  Females should not drink more than 1 alcoholic drink per day. Males should not drink more than 2 alcoholic drinks per day.  See your doctor as told. GET HELP RIGHT AWAY IF:   You have a blood pressure reading with a top number of 180 or higher.  You get a very bad headache.  You get blurred or changing vision.  You feel confused.  You feel weak, numb, or faint.  You get chest or belly (abdominal) pain.  You throw up (vomit).  You cannot breathe very well. MAKE SURE YOU:   Understand these instructions.  Will watch your condition.  Will get help right away if you are not doing well or get worse. Document Released: 06/12/2007 Document Revised: 03/18/2011 Document Reviewed: 06/12/2007 [facility] Patient Information 2014 [place], [place]. Diabetes Meal Planning Guide The diabetes meal planning guide is a tool to help you plan your meals and snacks. It is important for people with diabetes to manage their blood glucose (sugar) levels. Choosing the right foods and the right amounts throughout your day will help control your blood glucose. Eating right can even help you improve your blood pressure and reach or maintain a healthy weight. CARBOHYDRATE COUNTING MADE EASY When you eat carbohydrates, they turn to sugar. This raises your blood glucose level. Counting carbohydrates can  help you control this level so you feel better. When you plan your meals by counting carbohydrates, you can have more flexibility in what you eat and balance your medicine with your food intake. Carbohydrate counting simply means adding up the total amount of carbohydrate grams in your meals and snacks. Try to eat about the same amount at each meal. Foods with carbohydrates are listed below. Each portion below is 1 carbohydrate serving or 15 grams of carbohydrates. Ask your dietician how many grams of carbohydrates you should eat at each meal or snack. Grains and Starches  1 slice bread.   English muffin or hotdog/hamburger bun.   cup cold cereal (unsweetened).   cup cooked pasta or rice.   cup starchy vegetables (corn, potatoes, peas, beans, winter squash).  1 tortilla (6 inches).   bagel.  1 waffle or pancake (size of a CD).   cup cooked cereal.  4 to 6 small crackers. *Whole grain is recommended. Fruit  1 cup fresh unsweetened berries, melon, papaya, pineapple.  1 small fresh fruit.   banana or mango.   cup fruit juice (4 oz unsweetened).   cup canned fruit in natural juice or water.  2 tbs dried fruit.  12 to 15 grapes or cherries. Milk and Yogurt  1 cup fat-free or 1% milk.  1 cup soy milk.  6 oz light yogurt with sugar-free sweetener.  6 oz low-fat soy yogurt.  6 oz plain yogurt. Vegetables  1 cup raw or  cup cooked is counted as 0 carbohydrates or a "free"  food.  If you eat 3 or more servings at 1 meal, count them as 1 carbohydrate serving. Other Carbohydrates   oz chips or pretzels.   cup ice cream or frozen yogurt.   cup sherbet or sorbet.  2 inch square cake, no frosting.  1 tbs honey, sugar, jam, jelly, or syrup.  2 small cookies.  3 squares of graham crackers.  3 cups popcorn.  6 crackers.  1 cup broth-based soup.  Count 1 cup casserole or other mixed foods as 2 carbohydrate servings.  Foods with less than 20  calories in a serving may be counted as 0 carbohydrates or a "free" food. You may want to purchase a book or computer software that lists the carbohydrate gram counts of different foods. In addition, the nutrition facts panel on the labels of the foods you eat are a good source of this information. The label will tell you how big the serving size is and the total number of carbohydrate grams you will be eating per serving. Divide this number by 15 to obtain the number of carbohydrate servings in a portion. Remember, 1 carbohydrate serving equals 15 grams of carbohydrate. SERVING SIZES Measuring foods and serving sizes helps you make sure you are getting the right amount of food. The list below tells how big or small some common serving sizes are.  1 oz.........4 stacked dice.  3 oz........Marland KitchenDeck of cards.  1 tsp.......Marland KitchenTip of little finger.  1 tbs......Marland KitchenMarland KitchenThumb.  2 tbs.......Marland KitchenGolf ball.   cup......Marland KitchenHalf of a fist.  1 cup.......Marland KitchenA fist. SAMPLE DIABETES MEAL PLAN Below is a sample meal plan that includes foods from the grain and starches, dairy, vegetable, fruit, and meat groups. A dietician can individualize a meal plan to fit your calorie needs and tell you the number of servings needed from each food group. However, controlling the total amount of carbohydrates in your meal or snack is more important than making sure you include all of the food groups at every meal. You may interchange carbohydrate containing foods (dairy, starches, and fruits). The meal plan below is an example of a 2000 calorie diet using carbohydrate counting. This meal plan has 17 carbohydrate servings. Breakfast  1 cup oatmeal (2 carb servings).   cup light yogurt (1 carb serving).  1 cup blueberries (1 carb serving).   cup almonds. Snack  1 large apple (2 carb servings).  1 low-fat string cheese stick. Lunch  Chicken breast salad.  1 cup spinach.   cup chopped tomatoes.  2 oz chicken breast,  sliced.  2 tbs low-fat [place] dressing.  12 whole-wheat crackers (2 carb servings).  12 to 15 grapes (1 carb serving).  1 cup low-fat milk (1 carb serving). Snack  1 cup carrots.   cup hummus (1 carb serving). Dinner  3 oz broiled salmon.  1 cup brown rice (3 carb servings). Snack  1  cups steamed broccoli (1 carb serving) drizzled with 1 tsp olive oil and lemon juice.  1 cup light pudding (2 carb servings). DIABETES MEAL PLANNING WORKSHEET Your dietician can use this worksheet to help you decide how many servings of foods and what types of foods are right for you.  BREAKFAST Food Group and Servings / Carb Servings Grain/Starches __________________________________ Dairy __________________________________________ Vegetable ______________________________________ Fruit ___________________________________________ Meat __________________________________________ Fat ____________________________________________ LUNCH Food Group and Servings / Carb Servings Grain/Starches ___________________________________ Dairy ___________________________________________ Fruit ____________________________________________ Meat ___________________________________________ Fat _____________________________________________ Brittney Gray Food Group and Servings / Carb Servings Grain/Starches ___________________________________ Dairy ___________________________________________ Fruit ____________________________________________ Meat ___________________________________________  Fat _____________________________________________ SNACKS Food Group and Servings / Carb Servings Grain/Starches ___________________________________ Dairy ___________________________________________ Vegetable _______________________________________ Fruit ____________________________________________ Meat ___________________________________________ Fat _____________________________________________ DAILY TOTALS Starches  _________________________ Vegetable ________________________ Fruit ____________________________ Dairy ____________________________ Meat ____________________________ Fat ______________________________ Document Released: 09/20/2004 Document Revised: 03/18/2011 Document Reviewed: 08/01/2008 [employer] Patient Information 2014 [place], [place]. Diabetes and Exercise Exercising regularly is important. It is not just about losing weight. It has many health benefits, such as:  Improving your overall fitness, flexibility, and endurance.  Increasing your bone density.  Helping with weight control.  Decreasing your body fat.  Increasing your muscle strength.  Reducing stress and tension.  Improving your overall health. People with diabetes who exercise gain additional benefits because exercise:  Reduces appetite.  Improves the body's use of blood sugar (glucose).  Helps lower or control blood glucose.  Decreases blood pressure.  Helps control blood lipids (such as cholesterol and triglycerides).  Improves the body's use of the hormone insulin by:  Increasing the body's insulin sensitivity.  Reducing the body's insulin needs.  Decreases the risk for heart disease because exercising:  Lowers cholesterol and triglycerides levels.  Increases the levels of good cholesterol (such as high-density lipoproteins [HDL]) in the body.  Lowers blood glucose levels. YOUR ACTIVITY PLAN  Choose an activity that you enjoy and set realistic goals. Your health care provider or diabetes educator can help you make an activity plan that works for you. You can break activities into 2 or 3 sessions throughout the day. Doing so is as good as one long session. Exercise ideas include:  Taking the dog for a walk.  Taking the stairs instead of the elevator.  Dancing to your favorite song.  Doing your favorite exercise with a friend. RECOMMENDATIONS FOR EXERCISING WITH TYPE 1 OR TYPE 2 DIABETES    Check your blood glucose before exercising. If blood glucose levels are greater than 240 mg/dL, check for urine ketones. Do not exercise if ketones are present.  Avoid injecting insulin into areas of the body that are going to be exercised. For example, avoid injecting insulin into:  The arms when playing tennis.  The legs when jogging.  Keep a record of:  Food intake before and after you exercise.  Expected peak times of insulin action.  Blood glucose levels before and after you exercise.  The type and amount of exercise you have done.  Review your records with your health care provider. Your health care provider will help you to develop guidelines for adjusting food intake and insulin amounts before and after exercising.  If you take insulin or oral hypoglycemic agents, watch for signs and symptoms of hypoglycemia. They include:  Dizziness.  Shaking.  Sweating.  Chills.  Confusion.  Drink plenty of water while you exercise to prevent dehydration or heat stroke. Body water is lost during exercise and must be replaced.  Talk to your health care provider before starting an exercise program to make sure it is safe for you. Remember, almost any type of activity is better than none. Document Released: 03/16/2003 Document Revised: 08/26/2012 Document Reviewed: 06/02/2012 [facility] Patient Information 2014 [place].

## 2013-01-22 NOTE — Progress Notes (Signed)
   Subjective:    Patient ID: Brittney Gray, female    DOB: 1950-07-20, 63 y.o.   MRN: [id number]  HPI Comments: 63 yo female concerned about new RX starts. She notes she has been feeling flushed or not right since stopping Norvasc and starting ZIAC. She notes BP has been good. She did not start MF until yesterday and notes flushing started with Ziac.   She recently was on ABX for UTi and has had improvement with symptoms.      Review of Systems  Constitutional:       Flushing  All other systems reviewed and are negative.   BP 118/80  Pulse 68  Temp(Src) 98.4 F (36.9 C) (Temporal)  Resp 18  Ht 5' (1.524 m)  Wt 200 lb (90.719 kg)  BMI 39.06 kg/m2     Objective:   Physical Exam  Nursing note and vitals reviewed. Constitutional: She is oriented to person, place, and time. She appears well-developed and well-nourished. No distress.  HENT:  Head: Normocephalic and atraumatic.  Right Ear: External ear normal.  Left Ear: External ear normal.  Nose: Nose normal.  Mouth/Throat: Oropharynx is clear and moist.  Eyes: Conjunctivae and EOM are normal.  Neck: Normal range of motion. Neck supple. No JVD present. No thyromegaly present.  Cardiovascular: Normal rate, regular rhythm, normal heart sounds and intact distal pulses.   Pulmonary/Chest: Effort normal and breath sounds normal.  Abdominal: Soft. Bowel sounds are normal. She exhibits no distension and no mass. There is no tenderness. There is no rebound and no guarding.  Musculoskeletal: Normal range of motion. She exhibits no edema and no tenderness.  Lymphadenopathy:    She has no cervical adenopathy.  Neurological: She is alert and oriented to person, place, and time. No cranial nerve deficit.  Skin: Skin is warm and dry. No rash noted. No erythema. No pallor.  Psychiatric: She has a normal mood and affect. Her behavior is normal. Judgment and thought content normal.          Assessment & Plan:  1. HTN- Check BP call  if >130/80, increase cardio D/C Ziac and restart Norvasc with flushing sensation. w/c if SX increase or ER.  2. Will need NV 2.5 weeks to recheck BP and Ua C/S with recent UTI

## 2013-02-10 ENCOUNTER — Ambulatory Visit: Payer: Self-pay

## 2013-02-11 ENCOUNTER — Ambulatory Visit (INDEPENDENT_AMBULATORY_CARE_PROVIDER_SITE_OTHER): Payer: BC Managed Care – PPO

## 2013-02-11 ENCOUNTER — Other Ambulatory Visit: Payer: Self-pay | Admitting: Emergency Medicine

## 2013-02-11 ENCOUNTER — Telehealth: Payer: Self-pay

## 2013-02-11 DIAGNOSIS — N3000 Acute cystitis without hematuria: Secondary | ICD-10-CM

## 2013-02-11 NOTE — Telephone Encounter (Signed)
Pt called and wanted you to know she's on Vit D3 10,000 IU QD and Metsormin 500 mg QD.

## 2013-02-11 NOTE — Progress Notes (Signed)
Patient ID: Brittney Gray, female   DOB: 04/18/1950, 63 y.o.   MRN: [id number] Patient here today to recheck abnormal urine result from 01/04/2013.

## 2013-02-12 LAB — URINALYSIS, ROUTINE W REFLEX MICROSCOPIC
Bilirubin Urine: NEGATIVE
Glucose, UA: NEGATIVE mg/dL
Hgb urine dipstick: NEGATIVE
Ketones, ur: NEGATIVE mg/dL
Leukocytes, UA: NEGATIVE
NITRITE: NEGATIVE
Protein, ur: NEGATIVE mg/dL
SPECIFIC GRAVITY, URINE: 1.022 (ref 1.005–1.030)
Urobilinogen, UA: 0.2 mg/dL (ref 0.0–1.0)
pH: 5.5 (ref 5.0–8.0)

## 2013-02-12 LAB — URINE CULTURE
Colony Count: NO GROWTH
Organism ID, Bacteria: NO GROWTH

## 2013-03-08 ENCOUNTER — Other Ambulatory Visit: Payer: Self-pay | Admitting: Internal Medicine

## 2013-03-08 ENCOUNTER — Encounter: Payer: Self-pay | Admitting: Emergency Medicine

## 2013-03-08 ENCOUNTER — Ambulatory Visit (INDEPENDENT_AMBULATORY_CARE_PROVIDER_SITE_OTHER): Payer: BC Managed Care – PPO | Admitting: Emergency Medicine

## 2013-03-08 VITALS — BP 142/94 | HR 96 | Temp 98.2°F | Resp 16 | Ht 60.0 in | Wt 200.0 lb

## 2013-03-08 DIAGNOSIS — R7309 Other abnormal glucose: Secondary | ICD-10-CM

## 2013-03-08 DIAGNOSIS — I10 Essential (primary) hypertension: Secondary | ICD-10-CM

## 2013-03-08 DIAGNOSIS — E669 Obesity, unspecified: Secondary | ICD-10-CM

## 2013-03-08 DIAGNOSIS — E559 Vitamin D deficiency, unspecified: Secondary | ICD-10-CM

## 2013-03-08 LAB — CBC WITH DIFFERENTIAL/PLATELET
BASOS ABS: 0 10*3/uL (ref 0.0–0.1)
BASOS PCT: 0 % (ref 0–1)
EOS ABS: 0.2 10*3/uL (ref 0.0–0.7)
Eosinophils Relative: 2 % (ref 0–5)
HEMATOCRIT: 40.8 % (ref 36.0–46.0)
Hemoglobin: 14 g/dL (ref 12.0–15.0)
Lymphocytes Relative: 34 % (ref 12–46)
Lymphs Abs: 2.6 10*3/uL (ref 0.7–4.0)
MCH: 29.2 pg (ref 26.0–34.0)
MCHC: 34.3 g/dL (ref 30.0–36.0)
MCV: 85.2 fL (ref 78.0–100.0)
MONO ABS: 0.5 10*3/uL (ref 0.1–1.0)
Monocytes Relative: 7 % (ref 3–12)
NEUTROS ABS: 4.3 10*3/uL (ref 1.7–7.7)
Neutrophils Relative %: 57 % (ref 43–77)
Platelets: 241 10*3/uL (ref 150–400)
RBC: 4.79 MIL/uL (ref 3.87–5.11)
RDW: 14.5 % (ref 11.5–15.5)
WBC: 7.5 10*3/uL (ref 4.0–10.5)

## 2013-03-08 NOTE — Patient Instructions (Signed)
   Bad carbs also include fruit juice, alcohol, and sweet tea. These are empty calories that do not signal to your brain that you are full.   Please remember the good carbs are still carbs which convert into sugar. So please measure them out no more than 1/2-1 cup of rice, oatmeal, pasta, and beans.  Veggies are however free foods! Pile them on.   I like lean protein at every meal such as chicken, [place], pork chops, cottage cheese, etc. Just do not fry these meats and please center your meal around vegetable, the meats should be a side dish.   No all fruit is created equal. Please see the list below, the fruit at the bottom is higher in sugars than the fruit at the top   We want weight loss that will last so you should lose 1-2 pounds a week.  THAT IS IT! Please pick THREE things a month to change. Once it is a habit check off the item. Then pick another three items off the list to become habits.  If you are already doing a habit on the list GREAT!  Cross that item off! o Don't drink your calories. Ie, alcohol, soda, fruit juice, and sweet tea.  o Drink more water. Drink a glass when you feel hungry or before each meal.  o Eat breakfast - Complex carb and protein (likeDannon light and fit yogurt, oatmeal, fruit, eggs, [place] bacon). o Measure your cereal.  Eat no more than one cup a day. (ie [place]) o Eat an apple a day. o Add a vegetable a day. o Try a new vegetable a month. o Use Pam! Stop using oil or butter to cook. o Don't finish your plate or use smaller plates. o Share your dessert. o Eat sugar free Jello for dessert or frozen grapes. o Don't eat 2-3 hours before bed. o Switch to whole wheat bread, pasta, and brown rice. o Make healthier choices when you eat out. No fries! o Pick baked chicken, NOT fried. o Don't forget to SLOW DOWN when you eat. It is not going anywhere.  o Take the stairs. o Park far away in the parking lot o [employer] (or weights) for 10 minutes while  watching TV. o Walk at work for 10 minutes during break. o Walk outside 1 time a week with your friend, kids, dog, or significant other. o Start a walking group at [place] the mall as much as you can tolerate.  o Keep a food diary. o Weigh yourself daily. o Walk for 15 minutes 3 days per week. o Cook at home more often and eat out less.  If life happens and you go back to old habits, it is okay.  Just start over. You can do it!   If you experience chest pain, get short of breath, or tired during the exercise, please stop immediately and inform your doctor.

## 2013-03-09 LAB — HEPATIC FUNCTION PANEL
ALBUMIN: 4 g/dL (ref 3.5–5.2)
AST: 12 U/L (ref 0–37)
Alkaline Phosphatase: 67 U/L (ref 39–117)
BILIRUBIN INDIRECT: 0.2 mg/dL (ref 0.2–1.2)
Bilirubin, Direct: 0.1 mg/dL (ref 0.0–0.3)
TOTAL PROTEIN: 6.4 g/dL (ref 6.0–8.3)
Total Bilirubin: 0.3 mg/dL (ref 0.2–1.2)

## 2013-03-09 LAB — BASIC METABOLIC PANEL WITH GFR
BUN: 13 mg/dL (ref 6–23)
CALCIUM: 9.2 mg/dL (ref 8.4–10.5)
CO2: 26 mEq/L (ref 19–32)
CREATININE: 0.78 mg/dL (ref 0.50–1.10)
Chloride: 106 mEq/L (ref 96–112)
GFR, EST NON AFRICAN AMERICAN: 81 mL/min
Glucose, Bld: 92 mg/dL (ref 70–99)
Potassium: 3.9 mEq/L (ref 3.5–5.3)
Sodium: 144 mEq/L (ref 135–145)

## 2013-03-09 LAB — INSULIN, RANDOM: INSULIN: 16 u[IU]/mL (ref 3–28)

## 2013-03-09 NOTE — Progress Notes (Signed)
   Subjective:    Patient ID: Quanda Pavlicek, female    DOB: 1950/09/14, 63 y.o.   MRN: [id number]  HPI Comments: 63 yo obese pleasant female with close f/u with BP/ BS/ VIT D with in ability to continue metformin after last OV due to stomach issues. A1c 6.4 VIT D 11 She has added vitamin D without any SE. She does not check her BS at home because she needs a meter and diabetic education. She is trying to improve diet and weight loss. She has increased activity level but has not added cardio. She has not been checking her BP at home but denies any CV complaints.  Hypertension    Current Outpatient Prescriptions on File Prior to Visit  Medication Sig Dispense Refill  . amLODipine (NORVASC) 10 MG tablet Take 1 tablet (10 mg total) by mouth daily.  30 tablet  3  . Cholecalciferol (VITAMIN D3) 5000 UNITS CAPS Take 2 gelcaps (= 10,000 units daily)  30 capsule     No current facility-administered medications on file prior to visit.   Allergies  Allergen Reactions  . Ziac [Bisoprolol-Hydrochlorothiazide]     Flushing    Past Medical History  Diagnosis Date  . Hypertension   . Pre-diabetes   . Vitamin D deficiency   . Obesity      Review of Systems  All other systems reviewed and are negative.   BP 142/94  Pulse 96  Temp(Src) 98.2 F (36.8 C) (Temporal)  Resp 16  Ht 5' (1.524 m)  Wt 200 lb (90.719 kg)  BMI 39.06 kg/m2 REcheck 138/88    Objective:   Physical Exam  Nursing note and vitals reviewed. Constitutional: She is oriented to person, place, and time. She appears well-developed and well-nourished.  obese  HENT:  Head: Normocephalic and atraumatic.  Eyes: Conjunctivae are normal.  Neck: Normal range of motion.  Cardiovascular: Normal rate, regular rhythm, normal heart sounds and intact distal pulses.   Pulmonary/Chest: Effort normal and breath sounds normal.  Abdominal: Soft. Bowel sounds are normal. She exhibits no distension and no mass. There is no tenderness.  There is no rebound and no guarding.  Musculoskeletal: Normal range of motion.  Neurological: She is alert and oriented to person, place, and time.  Skin: Skin is warm and dry.  Psychiatric: She has a normal mood and affect. Judgment normal.          Assessment & Plan:  1. HTN- Check BP call if >130/80, increase cardio 2. Elevated BS w/o DM with Obesity- needs weight loss, improved diet and exercise. DM education given and Freestyle lite meter given along with illustration in office of use with BS reading of 99. 3. Vit D low added 5000 IU- recheck labs, may help with energy/ immune system improvement.   EPIC WAS DONE AT TIME OF VISIT LABS MANUALLY ENTERED CMET/ CBC/ Insulin

## 2013-04-26 ENCOUNTER — Encounter: Payer: Self-pay | Admitting: Emergency Medicine

## 2013-04-26 ENCOUNTER — Ambulatory Visit (INDEPENDENT_AMBULATORY_CARE_PROVIDER_SITE_OTHER): Payer: BC Managed Care – PPO | Admitting: *Deleted

## 2013-04-26 VITALS — BP 124/82 | HR 100 | Temp 98.2°F | Resp 18 | Ht 60.0 in | Wt 198.0 lb

## 2013-04-26 DIAGNOSIS — Z111 Encounter for screening for respiratory tuberculosis: Secondary | ICD-10-CM

## 2013-04-26 NOTE — Progress Notes (Signed)
Patient ID: Brittney Gray, female   DOB: 04/05/1950, 63 y.o.   MRN: [id number] PPD was given with instructions. No exam performed

## 2013-04-29 ENCOUNTER — Ambulatory Visit: Payer: Self-pay

## 2013-04-30 ENCOUNTER — Ambulatory Visit: Payer: BC Managed Care – PPO | Admitting: *Deleted

## 2013-04-30 DIAGNOSIS — Z111 Encounter for screening for respiratory tuberculosis: Secondary | ICD-10-CM

## 2013-04-30 LAB — TB SKIN TEST
INDURATION: 0 mm
TB SKIN TEST: NEGATIVE

## 2013-04-30 NOTE — Progress Notes (Signed)
Patient ID: Brittney Gray, female   DOB: 09-18-1950, 63 y.o.   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

## 2013-07-08 ENCOUNTER — Encounter: Payer: Self-pay | Admitting: Physician Assistant

## 2013-07-12 ENCOUNTER — Encounter: Payer: Self-pay | Admitting: Emergency Medicine

## 2013-07-19 ENCOUNTER — Encounter: Payer: Self-pay | Admitting: Physician Assistant

## 2013-09-16 ENCOUNTER — Encounter: Payer: Self-pay | Admitting: Physician Assistant

## 2013-09-16 ENCOUNTER — Ambulatory Visit (INDEPENDENT_AMBULATORY_CARE_PROVIDER_SITE_OTHER): Payer: BC Managed Care – PPO | Admitting: Physician Assistant

## 2013-09-16 VITALS — BP 130/70 | HR 68 | Temp 97.7°F | Resp 16 | Ht 60.0 in | Wt 203.0 lb

## 2013-09-16 DIAGNOSIS — Z1159 Encounter for screening for other viral diseases: Secondary | ICD-10-CM

## 2013-09-16 DIAGNOSIS — Z79899 Other long term (current) drug therapy: Secondary | ICD-10-CM

## 2013-09-16 DIAGNOSIS — Z0000 Encounter for general adult medical examination without abnormal findings: Secondary | ICD-10-CM

## 2013-09-16 DIAGNOSIS — E119 Type 2 diabetes mellitus without complications: Secondary | ICD-10-CM

## 2013-09-16 DIAGNOSIS — E559 Vitamin D deficiency, unspecified: Secondary | ICD-10-CM

## 2013-09-16 DIAGNOSIS — I10 Essential (primary) hypertension: Secondary | ICD-10-CM

## 2013-09-16 DIAGNOSIS — D509 Iron deficiency anemia, unspecified: Secondary | ICD-10-CM

## 2013-09-16 DIAGNOSIS — E782 Mixed hyperlipidemia: Secondary | ICD-10-CM

## 2013-09-16 DIAGNOSIS — E669 Obesity, unspecified: Secondary | ICD-10-CM

## 2013-09-16 LAB — CBC WITH DIFFERENTIAL/PLATELET
BASOS ABS: 0 10*3/uL (ref 0.0–0.1)
BASOS PCT: 0 % (ref 0–1)
EOS ABS: 0.3 10*3/uL (ref 0.0–0.7)
Eosinophils Relative: 4 % (ref 0–5)
HCT: 39.4 % (ref 36.0–46.0)
Hemoglobin: 13.4 g/dL (ref 12.0–15.0)
Lymphocytes Relative: 34 % (ref 12–46)
Lymphs Abs: 2.6 10*3/uL (ref 0.7–4.0)
MCH: 27.9 pg (ref 26.0–34.0)
MCHC: 34 g/dL (ref 30.0–36.0)
MCV: 81.9 fL (ref 78.0–100.0)
MONOS PCT: 8 % (ref 3–12)
Monocytes Absolute: 0.6 10*3/uL (ref 0.1–1.0)
NEUTROS PCT: 54 % (ref 43–77)
Neutro Abs: 4.1 10*3/uL (ref 1.7–7.7)
PLATELETS: 269 10*3/uL (ref 150–400)
RBC: 4.81 MIL/uL (ref 3.87–5.11)
RDW: 14.4 % (ref 11.5–15.5)
WBC: 7.5 10*3/uL (ref 4.0–10.5)

## 2013-09-16 LAB — HEMOGLOBIN A1C
Hgb A1c MFr Bld: 6.6 % — ABNORMAL HIGH (ref <5.7)
MEAN PLASMA GLUCOSE: 143 mg/dL — AB (ref <117)

## 2013-09-16 NOTE — Patient Instructions (Signed)

## 2013-09-16 NOTE — Progress Notes (Signed)
Complete Physical  Assessment and Plan: 1. Unspecified essential hypertension Controlled on norvasc - EKG 12-Lead - [place], RETROPERITNL ABD,  LTD  2. Mixed hyperlipidemia  check lipids, decrease fatty foods, increase activity.   3. Unspecified vitamin D deficiency Continue meds, check level  4. Type II or unspecified type diabetes mellitus without mention of complication, not stated as uncontrolled Discussed general issues about diabetes pathophysiology and management., Educational material distributed., Suggested low cholesterol diet., Encouraged aerobic exercise., Discussed foot care., Reminded to get yearly retinal exam. - HM DIABETES FOOT EXAM  5. Routine general medical examination at a health care facility - CBC with Differential - BASIC METABOLIC PANEL WITH GFR - Hepatic function panel - TSH - Lipid panel - Hemoglobin A1c - Insulin, fasting - Vit D  25 hydroxy (rtn osteoporosis monitoring) - Urinalysis, Routine w reflex microscopic - Microalbumin / creatinine urine ratio - Vitamin B12 - Magnesium - Iron and TIBC - Ferritin  6. Screening for viral disease - Hepatitis A antibody, total - Hepatitis B core antibody, total - Hepatitis B e antibody - Hepatitis B surface antibody - Hepatitis C antibody  7. Obesity, unspecified Obesity with co morbidities- long discussion about weight loss, diet, and exercise  8. Anemia, iron deficiency No PPI, no metformin, + postmenopausal, unknown last colonoscopy/EGD, + GERD symptoms- getting colonoscopy/EGD with Dr. Collene Mares  9. Dysphagia-  Likely GERD/stricture- seeing Dr. Collene Mares   Discussed med's effects and SE's. Screening labs and tests as requested with regular follow-up as recommended.  HPI 63 y.o. female  presents for a complete physical. She has had elevated blood pressure for 5-6 years. Her blood pressure has been controlled at home, today their BP is BP: 130/70 mmHg She does workout, she walks but it is sporadic She denies  chest pain, shortness of breath, dizziness.  She is not on cholesterol medication and denies myalgias. Her cholesterol is at goal. The cholesterol last visit was: LDL 97   She has been working on diet and exercise for prediabetes, and denies polydipsia and polyuria. Last A1C in the office was:  Lab Results  Component Value Date   HGBA1C 6.4* 01/04/2013   Patient is on Vitamin D supplement.   Lab Results  Component Value Date   VD25OH 11* 01/04/2013     She is retired from the school system, she is divorced, her son is 29 and she has a 54 year old grand daughter this month.  She has had a low iron last time she was here, iron 61, she is over due for colonoscopy and never had EGD, suppose to get with Dr. Collene Mares.  BMI is Body mass index is 39.65 kg/(m^2)., She is struggling with weight loss due to stress, limited income.  Wt Readings from Last 3 Encounters:  09/16/13 203 lb (92.08 kg)  04/26/13 198 lb (89.812 kg)  03/08/13 200 lb (90.719 kg)     Current Medications:  Current Outpatient Prescriptions on File Prior to Visit  Medication Sig Dispense Refill  . amLODipine (NORVASC) 10 MG tablet Take 1 tablet (10 mg total) by mouth daily.  30 tablet  3  . Cholecalciferol (VITAMIN D3) 5000 UNITS CAPS Take 2 gelcaps (= 10,000 units daily)  30 capsule     No current facility-administered medications on file prior to visit.   Health Maintenance:   Immunization History  Administered Date(s) Administered  . PPD Test 04/26/2013   Tetanus: 2009 due 2019 Pneumovax: Flu vaccine: declines Zostavax: Pap: 04/2011 due next year MGM: 01/2013  DEXA: 05/2011 normal will get at age 89 Colonoscopy: unknown last time/over due Dr. Collene Mares EGD: and will get this too  Allergies:  Allergies  Allergen Reactions  . Ziac [Bisoprolol-Hydrochlorothiazide]     Flushing    Medical History:  Past Medical History  Diagnosis Date  . Hypertension   . Pre-diabetes   . Vitamin D deficiency   . Obesity     Surgical History:  Past Surgical History  Procedure Laterality Date  . Cesarean section     Family History:  Family History  Problem Relation Age of Onset  . Stroke Mother   . Hypertension Sister   . Heart disease Brother    Social History:  History  Substance Use Topics  . Smoking status: Never Smoker   . Smokeless tobacco: Not on file  . Alcohol Use: No    Review of Systems: [X]  = complains of  [ ]  = denies  General: Fatigue [ ]  Fever [ ]  Chills [ ]  Weakness [ ]   Insomnia [x ]Weight change [ ]  Night sweats [ ]   Change in appetite [ ]  Eyes: Redness [ ]  Blurred vision [ ]  Diplopia [ ]  Discharge [ ]   ENT: Congestion [ ]  Sinus Pain [ ]  Post Nasal Drip [ ]  Sore Throat [ ]  Earache [ ]  hearing loss [ ]  Tinnitus [ ]  Snoring [ ]   Cardiac: Chest pain/pressure [ ]  SOB [ ]  Orthopnea [ ]   Palpitations [ ]   Paroxysmal nocturnal dyspnea[ ]  Claudication [ ]  Edema [ ]   Pulmonary: Cough [ ]  Wheezing[ ]   SOB [ ]   Pleurisy [ ]   GI: Nausea [ ]  Vomiting[ ]  Dysphagia[x ] Heartburn[x ] Abdominal pain [ ]  Constipation [ ] ; Diarrhea [ ]  BRBPR [ ]  Melena[ ]  Bloating [ ]  Hemorrhoids [ ]   GU: Hematuria[ ]  Dysuria [ ]  Nocturia[ ]  Urgency [ ]   Hesitancy [ ]  Discharge [ ]  Frequency [ ]   Breast:  Breast lumps [ ]   nipple discharge [ ]    Neuro: Headaches[ ]  Vertigo[ ]  Paresthesias[ ]  Spasm [ ]  Speech changes [ ]  Incoordination [ ]   Ortho: Arthritis [ ]  Joint pain [ ]  Muscle pain [ ]  Joint swelling [ ]  Back Pain [ ]  Skin:  Rash [ ]   Pruritis [ ]  Change in skin lesion [ ]   Psych: Depression[ ]  Anxiety[ ]  Confusion [ ]  Memory loss [ ]   Heme/Lypmh: Bleeding [ ]  Bruising [ ]  Enlarged lymph nodes [ ]   Endocrine: Visual blurring [ ]  Paresthesia [ ]  Polyuria [ ]  Polydypsea [ ]    Heat/cold intolerance [ ]  Hypoglycemia [ ]   Physical Exam: Estimated body mass index is 39.65 kg/(m^2) as calculated from the following:   Height as of this encounter: 5' (1.524 m).   Weight as of this encounter: 203 lb (92.08 kg). BP  130/70  Pulse 68  Temp(Src) 97.7 F (36.5 C)  Resp 16  Ht 5' (1.524 m)  Wt 203 lb (92.08 kg)  BMI 39.65 kg/m2 Wt Readings from Last 3 Encounters:  09/16/13 203 lb (92.08 kg)  04/26/13 198 lb (89.812 kg)  03/08/13 200 lb (90.719 kg)   General Appearance: Well nourished, in no apparent distress. Eyes: PERRLA, EOMs, conjunctiva no swelling or erythema, normal fundi and vessels. Sinuses: No Frontal/maxillary tenderness ENT/Mouth: Ext aud canals clear, normal light reflex with TMs without erythema, bulging.  Good dentition. No erythema, swelling, or exudate on post pharynx. Tonsils not swollen or erythematous. Hearing normal.  Neck: Supple, thyroid normal.  No bruits Respiratory: Respiratory effort normal, BS equal bilaterally without rales, rhonchi, wheezing or stridor. Cardio: RRR without murmurs, rubs or gallops. Brisk peripheral pulses with 1+ edema.  Chest: symmetric, with normal excursions and percussion. Breasts: declines Abdomen: Soft, +BS, obeseNon tender, no guarding, rebound, hernias, masses, or organomegaly. .  Lymphatics: Non tender without lymphadenopathy.  Genitourinary: defer Musculoskeletal: Full ROM all peripheral extremities,5/5 strength, and normal gait. Skin: Warm, dry without rashes, lesions, ecchymosis.  Neuro: Cranial nerves intact, reflexes equal bilaterally. Normal muscle tone, no cerebellar symptoms. Sensation intact.  Psych: Awake and oriented X 3, normal affect, Insight and Judgment appropriate.   EKG: WNL no changes. AORTA SCAN: WNL    [name] 2:06 PM

## 2013-09-17 LAB — MICROALBUMIN / CREATININE URINE RATIO
Creatinine, Urine: 192.2 mg/dL
Microalb Creat Ratio: 3.2 mg/g (ref 0.0–30.0)
Microalb, Ur: 0.61 mg/dL (ref 0.00–1.89)

## 2013-09-17 LAB — HEPATIC FUNCTION PANEL
AST: 12 U/L (ref 0–37)
Albumin: 4.1 g/dL (ref 3.5–5.2)
Alkaline Phosphatase: 78 U/L (ref 39–117)
BILIRUBIN DIRECT: 0.1 mg/dL (ref 0.0–0.3)
Indirect Bilirubin: 0.3 mg/dL (ref 0.2–1.2)
Total Bilirubin: 0.4 mg/dL (ref 0.2–1.2)
Total Protein: 6.8 g/dL (ref 6.0–8.3)

## 2013-09-17 LAB — HEPATITIS B SURFACE ANTIBODY,QUALITATIVE: Hep B S Ab: NEGATIVE

## 2013-09-17 LAB — HEPATITIS B CORE ANTIBODY, TOTAL: Hep B Core Total Ab: NONREACTIVE

## 2013-09-17 LAB — URINALYSIS, ROUTINE W REFLEX MICROSCOPIC
Bilirubin Urine: NEGATIVE
Glucose, UA: NEGATIVE mg/dL
Hgb urine dipstick: NEGATIVE
Ketones, ur: NEGATIVE mg/dL
Leukocytes, UA: NEGATIVE
Nitrite: NEGATIVE
PROTEIN: NEGATIVE mg/dL
Specific Gravity, Urine: 1.021 (ref 1.005–1.030)
UROBILINOGEN UA: 1 mg/dL (ref 0.0–1.0)
pH: 5.5 (ref 5.0–8.0)

## 2013-09-17 LAB — IRON AND TIBC
%SAT: 17 % — ABNORMAL LOW (ref 20–55)
Iron: 59 ug/dL (ref 42–145)
TIBC: 346 ug/dL (ref 250–470)
UIBC: 287 ug/dL (ref 125–400)

## 2013-09-17 LAB — LIPID PANEL
CHOLESTEROL: 146 mg/dL (ref 0–200)
HDL: 48 mg/dL (ref >39)
LDL Cholesterol: 79 mg/dL (ref 0–99)
Total CHOL/HDL Ratio: 3 Ratio
Triglycerides: 94 mg/dL (ref <150)
VLDL: 19 mg/dL (ref 0–40)

## 2013-09-17 LAB — BASIC METABOLIC PANEL WITH GFR
BUN: 10 mg/dL (ref 6–23)
CALCIUM: 9.7 mg/dL (ref 8.4–10.5)
CO2: 31 mEq/L (ref 19–32)
Chloride: 108 mEq/L (ref 96–112)
Creat: 0.73 mg/dL (ref 0.50–1.10)
GFR, EST NON AFRICAN AMERICAN: 88 mL/min
Glucose, Bld: 89 mg/dL (ref 70–99)
Potassium: 3.8 mEq/L (ref 3.5–5.3)
SODIUM: 142 meq/L (ref 135–145)

## 2013-09-17 LAB — VITAMIN B12: VITAMIN B 12: 679 pg/mL (ref 211–911)

## 2013-09-17 LAB — TSH: TSH: 1.521 u[IU]/mL (ref 0.350–4.500)

## 2013-09-17 LAB — VITAMIN D 25 HYDROXY (VIT D DEFICIENCY, FRACTURES): Vit D, 25-Hydroxy: 38 ng/mL (ref 30–89)

## 2013-09-17 LAB — INSULIN, FASTING: Insulin fasting, serum: 26.5 u[IU]/mL — ABNORMAL HIGH (ref 2.0–19.6)

## 2013-09-17 LAB — HEPATITIS A ANTIBODY, TOTAL: Hep A Total Ab: NONREACTIVE

## 2013-09-17 LAB — FERRITIN: Ferritin: 54 ng/mL (ref 10–291)

## 2013-09-17 LAB — HEPATITIS C ANTIBODY: HCV AB: NEGATIVE

## 2013-09-17 LAB — MAGNESIUM: MAGNESIUM: 1.8 mg/dL (ref 1.5–2.5)

## 2013-09-19 LAB — HEPATITIS B E ANTIBODY: HEPATITIS BE ANTIBODY: NONREACTIVE

## 2013-10-05 ENCOUNTER — Other Ambulatory Visit: Payer: Self-pay | Admitting: Emergency Medicine

## 2013-12-07 ENCOUNTER — Ambulatory Visit (INDEPENDENT_AMBULATORY_CARE_PROVIDER_SITE_OTHER): Payer: BC Managed Care – PPO | Admitting: Physician Assistant

## 2013-12-07 ENCOUNTER — Encounter: Payer: Self-pay | Admitting: Physician Assistant

## 2013-12-07 VITALS — BP 142/86 | HR 92 | Temp 98.0°F | Resp 16 | Ht 60.0 in | Wt 204.0 lb

## 2013-12-07 DIAGNOSIS — M720 Palmar fascial fibromatosis [Dupuytren]: Secondary | ICD-10-CM

## 2013-12-07 DIAGNOSIS — E65 Localized adiposity: Secondary | ICD-10-CM

## 2013-12-07 NOTE — Patient Instructions (Signed)
Mendel Ryder will call you with Ortho appt date and time.  I will also put in referral to plastic surgeon for the beginning of new year.  Continue stretches for right middle finger and take Ibuprofen or Aleve OTC.    Dupuytren's Contracture Dupuytren's contracture affects the fingers and the palm of the hand. This condition usually develops slowly. It may take many years to develop. The pinky finger and the ring finger are most often affected. These fingers start to curve inward, like a claw. At some point, the fingers cannot go straight anymore. This can make it hard to do things like:  Put on gloves.  Shake hands.  Grab something off a shelf. The condition usually does not cause pain and is not dangerous. The condition gets its name from the doctor who came up with an operation to fix the problem. His name was Lanney Gins Dupuytren. Contracture means pulling inward. CAUSES  Dupuytren's contracture does not start with the fingers. It starts in the palm of the hand, under the skin. The tissue under the skin is called fascia. The fascia covers the cords (tendons) that control how the fingers move. In Dupuytren's contracture the fascia tissue becomes thick and then pulls on the cords. That causes the fingers to curl. The condition can affect both hands and any fingers, but it usually strikes one hand worse than the other. The fingers farthest from the thumb are most often the ones that curl. The cause is not clear. Some experts believe it results from an autoimmune reaction. That means the body's immune system (which fights off disease) attacks itself by mistake. What experts do know is that certain conditions and behaviors (called risk factors) make the chance of having this condition more likely. They include:  Age. Most people who have the condition are older than 50.  Sex. It affects men more often than women.  Family history. The condition tends to run in families from countries in [place] and [place].  Certain behaviors. People who smoke and drink alcohol are more apt to develop the problem.  Some other medical conditions. Having diabetes makes Dupuytren's contracture more likely. So does having a condition that involves a seizure (when the brain's function is interrupted). SYMPTOMS  Signs of this condition take time to develop. Sometimes this takes weeks or months. More often, it takes several years.   Early symptoms:  Skin on the palm of the hand becomes thick. This is usually the first sign.  The skin may look dimpled or puckered.  Lumps (nodules) show up on the palm. There may be one or more lumps. They are not painful.  Later symptoms:  Thick cords of tissue form in the palm of the hand.  The pinky and ring fingers start to curl up into the palm.  The fingers cannot be straightened into their normal position. DIAGNOSIS  A physical examination is the main way that a healthcare provider can tell if you have Dupuytren's contracture. Other tests usually are not needed. The caregiver will probably:  Look at your hands. Feel your hands. This is to check for thickening and nodules.  Measure finger motion. This tells how much your fingers have contracted (pulled in).  Do a tabletop test. You will be asked to try to put your hand flat on a table, palm down. TREATMENT  There is no cure for Dupuytren's contracture. But there are ways to treat the symptoms. Options include:  Watching and waiting. The condition develops slowly. Often  it does not create problems for a long time. Sometimes the skin gets thick and nodules form, but the fingers never curl. So, in some cases it is best to just watch the condition carefully and wait to see what happens.  Shots (injections). Different substances may be injected, including:  Steroids. These drugs block swelling. These shots should make the condition less uncomfortable. Steroids may also slow down the condition. Shots  are given into the nodules. The effect only lasts awhile. More shots may have to be given.  Enzymes. These are proteins. They weaken the thick tissue. After an injection, the caregiver usually stretches the fingers.  Needling. A needle is pushed through the skin and into the thick tissue. This is done in several spots. The goal is to break up the thickened tissue. Or to weaken it.  Surgery. This may be suggested if you cannot grasp objects. Or, if you can no longer put your hand in your pocket.  A cut (incision) is made in the palm of the hand. The thick tissue is removed.  Sometimes the thick tissue is attached to the skin. Then, the skin must be removed, too. It is replaced with a piece of skin from another place on your body. That is called a skin graft.  Occupational or hand therapy is almost always needed after surgery. This involves special exercises to get back the use of your hand and fingers. After a skin graft, several months of therapy may be needed.  Sometimes the condition comes back, even after surgery.  Other methods. You can do some things on your own. They include:  Stretching the fingers backwards. Do this often.  Warming the hand and massaging it. Again, do this often.  Using tools with padded grips. This should make things easier.  Wearing heavy gloves while working. This protects the hands. PROGNOSIS  Dupuytren's contracture usually develops slowly. There is no cure. But, the symptoms can be treated. Sometimes they come back after treatment, but not always. It is important to remember that this is a functional problem and not a life-threatening condition. Document Released: 10/21/2008 Document Revised: 03/18/2011 Document Reviewed: 10/21/2008 [facility] Patient Information 2015 [place], [place]. This information is not intended to replace advice given to you by your health care provider. Make sure you discuss any questions you have with your health care provider.

## 2013-12-07 NOTE — Progress Notes (Signed)
Subjective:    Patient ID: Brittney Gray, female    DOB: 06/09/1950, 63 y.o.   MRN: [id number]  Hand Pain  There was no injury mechanism (Patient first noticed about 2-3 weeks ago). Pain location: Right middle finger  Radiates to: From right middle finger to hand. The pain is at a severity of 2/10. The pain is mild. Pertinent negatives include no numbness or tingling. Associated symptoms comments: Right middle finger is bent in the morning and it hurts when she straightens it out.  States she slowly opens it in morning a little bit at time.. Treatments tried: Aleve OTC. The treatment provided no relief.  GFR = 89 on 09/16/13 Review of Systems  Respiratory: Negative.   Cardiovascular: Negative.   Gastrointestinal: Negative.   Musculoskeletal: Negative.        Pain in right middle finger and she does notice a small knot near MP joint of that same finger.  Skin: Negative.  Negative for rash.       Patient is also concerned about extra axillary fat under both armpits.  She states she cannot fit them into her bra and are causing her pain.  She states the right one is larger than the left.  Neurological: Negative.  Negative for tingling and numbness.  Psychiatric/Behavioral: Negative.    Past Medical History  Diagnosis Date  . Hypertension   . Pre-diabetes   . Vitamin D deficiency   . Obesity    Current Outpatient Prescriptions on File Prior to Visit  Medication Sig Dispense Refill  . amLODipine (NORVASC) 10 MG tablet TAKE 1 TABLET (10 MG TOTAL) BY MOUTH DAILY. (TAKE A HALF TO ONE TABLET DEPENDING ON BP READING) 30 tablet 3  . Cholecalciferol (VITAMIN D3) 5000 UNITS CAPS Take 2 gelcaps (= 10,000 units daily) 30 capsule    No current facility-administered medications on file prior to visit.   Allergies  Allergen Reactions  . Ziac [Bisoprolol-Hydrochlorothiazide]     Flushing      BP 142/86 mmHg  Pulse 92  Temp(Src) 98 F (36.7 C) (Temporal)  Resp 16  Ht 5' (1.524 m)  Wt 204 lb  (92.534 kg)  BMI 39.84 kg/m2  SpO2 98% Wt Readings from Last 3 Encounters:  12/07/13 204 lb (92.534 kg)  09/16/13 203 lb (92.08 kg)  04/26/13 198 lb (89.812 kg)   Objective:   Physical Exam  Constitutional: She is oriented to person, place, and time. She appears well-developed and well-nourished. She does not have a sickly appearance. No distress.  HENT:  Head: Normocephalic.  Eyes: Conjunctivae and lids are normal. Right eye exhibits no discharge. Left eye exhibits no discharge. No scleral icterus.  Cardiovascular: Normal rate, regular rhythm, S1 normal, S2 normal, normal heart sounds, intact distal pulses and normal pulses.  Exam reveals no gallop, no distant heart sounds and no friction rub.   No murmur heard. Pulmonary/Chest: Effort normal and breath sounds normal. No respiratory distress. She has no decreased breath sounds. She has no wheezes. She has no rhonchi. She has no rales. She exhibits no tenderness.  Abdominal: Soft. Bowel sounds are normal.  Musculoskeletal: Normal range of motion.       Right wrist: Normal.       Left wrist: Normal.  Normal radial pulse, normal capillary refilling and circulation, tenderness upon palpation of right 3rd digit MP joint, Dupuytren's contracture on right 3rd digit (middle finger).  Normal sensation intact bilaterally.  Neurological: She is alert and oriented to person, place, and time.  No sensory deficit.  Strength is 4/5 in right hand and 5/5 in left hand.  Skin: Skin is warm, dry and intact. No rash noted. She is not diaphoretic. No erythema.     Psychiatric: She has a normal mood and affect. Her speech is normal and behavior is normal. Judgment and thought content normal. Cognition and memory are normal.  Vitals reviewed.     Assessment & Plan:  1. Dupuytren's contracture of right hand- right middle finger Continue to take Aleve OTC or try Ibuprofen OTC- follow directions on bottle for pain Will refer to ortho for evaluation and  possible surgery- Ambulatory referral to Orthopedic Surgery  2. Axillary fat pad Will refer to Dr. Cristine Polio who does take insurance.  Patient is looking to possibly getting evaluated after the beginning of 2016.  - Ambulatory referral to Plastic Surgery  Discussed medication effects and SE's.  Pt agreed to treatment plan. Please keep your follow up appt on 01/13/14.  Virgen Belland, Stephani Police, PA-C 8:55 AM [facility]

## 2014-01-13 ENCOUNTER — Ambulatory Visit: Payer: Self-pay | Admitting: Physician Assistant

## 2014-01-20 ENCOUNTER — Ambulatory Visit (INDEPENDENT_AMBULATORY_CARE_PROVIDER_SITE_OTHER): Payer: BC Managed Care – PPO | Admitting: Physician Assistant

## 2014-01-20 ENCOUNTER — Encounter: Payer: Self-pay | Admitting: Physician Assistant

## 2014-01-20 VITALS — BP 134/86 | HR 94 | Temp 98.0°F | Resp 18 | Ht 60.0 in | Wt 200.0 lb

## 2014-01-20 DIAGNOSIS — J209 Acute bronchitis, unspecified: Secondary | ICD-10-CM

## 2014-01-20 MED ORDER — AZITHROMYCIN 250 MG PO TABS: ORAL_TABLET | ORAL | Status: AC

## 2014-01-20 MED ORDER — PROMETHAZINE-CODEINE 6.25-10 MG/5ML PO SYRP: 5.0000 mL | ORAL_SOLUTION | Freq: Four times a day (QID) | ORAL | Status: DC | PRN

## 2014-01-20 MED ORDER — PREDNISONE 5 MG PO TABS: ORAL_TABLET | ORAL | Status: AC

## 2014-01-20 NOTE — Progress Notes (Signed)
HPI  An African American 64 y.o.female presents to the office today due to non-productive cough for 1 week.  The sxs have been gradually worsening.  Her son had same sxs at home.  She has tried Delsym and [employer], advertising OTC with no relief.  She states her sxs are worse when lying down.  She reports congestion and raspy voice for 1 day.  She denies fever, chills, sweats, fatigue, ear pain/congestion, sinus pressure, post-nasal drip, sore throat, trouble swallowing, chest tightness, SOB, CP, abdominal pain, nausea, vomiting, diarrhea, constipation, headaches, dizziness and lightheadedness.      Never Smoked. GFR= >89 on 09/16/13  Review of Systems  All other systems reviewed and are negative.  Past Medical History-  Past Medical History  Diagnosis Date  . Hypertension   . Pre-diabetes   . Vitamin D deficiency   . Obesity    Medications-  Current Outpatient Prescriptions on File Prior to Visit  Medication Sig Dispense Refill  . amLODipine (NORVASC) 10 MG tablet TAKE 1 TABLET (10 MG TOTAL) BY MOUTH DAILY. (TAKE A HALF TO ONE TABLET DEPENDING ON BP READING) 30 tablet 3  . Cholecalciferol (VITAMIN D3) 5000 UNITS CAPS Take 2 gelcaps (= 10,000 units daily) 30 capsule    No current facility-administered medications on file prior to visit.   Allergies-  Allergies  Allergen Reactions  . Ziac [Bisoprolol-Hydrochlorothiazide]     Flushing    Physical Exam BP 134/86 mmHg  Pulse 94  Temp(Src) 98 F (36.7 C) (Temporal)  Resp 18  Ht 5' (1.524 m)  Wt 200 lb (90.719 kg)  BMI 39.06 kg/m2  SpO2 98% Wt Readings from Last 3 Encounters:  01/20/14 200 lb (90.719 kg)  12/07/13 204 lb (92.534 kg)  09/16/13 203 lb (92.08 kg)  Vitals Reviewed.   General Appearance: Well nourished, in no apparent distress and had pleasant demeanor. Obese. Eyes:  PERRLA. EOMI. Conjunctiva is pink without edema, erythema or yellowing.  No scleral icterus. Sinuses: No Frontal/maxillary tenderness Ears: No erythema,  edema or tenderness on both external ear cartilages and ear canals.  TMs are intact bilaterally with normal light reflexes and without erythema, edema or bulging. Nose: Nose is symmetrical and turbinates are erythematous and non-edematous bilaterally.  No polyps or paleness.  Rhinorrhea present. Throat: Oral pharynx is pink and moist.  Erythematous and non-edematous posterior pharynx.  Mucosa is intact and without lesions. Tonsils are at +1 station bilaterally and do not have exudate.    Uvula is midline and not swollen. Neck: Supple, raspy voice,  LAD, trachea is midline. Full range of motion in neck intact Respiratory: Wheezing in all lung fields.  No rales, rhonchi or stridor. No increased effort of breathing. Cardio: RRR.   m/r/g.  S1S2nl.  Abdomen: Symmetrical, soft, nontender, and rounded/pendulous.  +BS nl x4.  Extremities:  C/C/E in upper and lower extremities. Pulses B/L +2  Skin: Warm, dry, intact without rashes, lesions, ecchymosis, yellowing, cyanosis.  Neuro: Alert and oriented X3, cooperative.  Mood and affect appropriate to situation. CN II-XII grossly intact.    Psych: Insight and Judgment appropriate.   Assessment and Plan 1. Acute bronchitis, unspecified organism - azithromycin (ZITHROMAX) 250 MG tablet; Take 2 tablets PO on day 1, then 1 tablet PO Q24H x 4 days  Dispense: 6 tablet; Refill: 1 - predniSONE (DELTASONE) 5 MG tablet; Take 3 tablets PO for 2 days, then take 2 tablets PO for 2 days, then take 1 tablet PO for 3 days.  Dispense: 13  tablet; Refill: 0 - For cough and cough pain- promethazine-codeine (PHENERGAN WITH CODEINE) 6.25-10 MG/5ML syrup; Take 5 mLs by mouth every 6 (six) hours as needed for cough. Max: 48mL per day  Dispense: 240 mL; Refill: 0  Discussed medication effects and SE's.  Pt agreed to treatment plan. If you are not feeling better in 10-14 days, then please call the office. Please keep your follow up appt on 04/04/14.  Ashutosh Dieguez, Stephani Police,  PA-C 3:05 PM [facility]

## 2014-01-20 NOTE — Patient Instructions (Signed)
-  Take Z-Pak as prescribed. -Take Prednisone as prescribed.  Please watch what you eat. -Take Promethazine-codeine as prescribed for cough.  Can make you sleepy.  Please take first dose at home.  Please drink water to stay hydrated.  If you are not feeling better in 10-14 days, then please call the office.

## 2014-03-10 ENCOUNTER — Ambulatory Visit (INDEPENDENT_AMBULATORY_CARE_PROVIDER_SITE_OTHER): Payer: BC Managed Care – PPO | Admitting: Family Medicine

## 2014-03-10 VITALS — BP 120/80 | HR 89 | Temp 97.9°F | Resp 18 | Ht 61.0 in | Wt 205.0 lb

## 2014-03-10 DIAGNOSIS — R0789 Other chest pain: Secondary | ICD-10-CM | POA: Diagnosis not present

## 2014-03-10 DIAGNOSIS — R079 Chest pain, unspecified: Secondary | ICD-10-CM

## 2014-03-10 LAB — GLUCOSE, POCT (MANUAL RESULT ENTRY): POC GLUCOSE: 98 mg/dL (ref 70–99)

## 2014-03-10 MED ORDER — CYCLOBENZAPRINE HCL 5 MG PO TABS: 5.0000 mg | ORAL_TABLET | Freq: Three times a day (TID) | ORAL | Status: DC | PRN

## 2014-03-10 MED ORDER — DICLOFENAC SODIUM 75 MG PO TBEC: 75.0000 mg | DELAYED_RELEASE_TABLET | Freq: Two times a day (BID) | ORAL | Status: DC

## 2014-03-10 NOTE — Progress Notes (Signed)
EKG read and patient discussed with Windell Hummingbird, PA-C. Agree with assessment and plan of care per her note.

## 2014-03-10 NOTE — Patient Instructions (Signed)
Heat to the area

## 2014-03-10 NOTE — Progress Notes (Signed)
Subjective:    Patient ID: Brittney Gray, female    DOB: 15-Apr-1950, 64 y.o.   MRN: [id number]  HPI Pt presents to clinic with left sided chest wall pain that started about 4 days ago and it does not seem to be getting better.  It is better in the am and then worsens when she is laying down.  It is worse with movement of her arms and chest wall and really bad when she is laying down and trying to roll over.  She is having no SOB but she is having pain with deep breaths and she is not having N/V.  She is able to walk without increased pain.    Used heating pad - no help Aleve - helped only slightly  Review of Systems  Patient Active Problem List   Diagnosis Date Noted  . Obesity, unspecified 09/16/2013  . Anemia, iron deficiency 09/16/2013  . Type II or unspecified type diabetes mellitus without mention of complication, not stated as uncontrolled 01/07/2013  . Unspecified essential hypertension 01/03/2013  . Mixed hyperlipidemia 01/03/2013  . Unspecified vitamin D deficiency 01/03/2013   Prior to Admission medications   Medication Sig Start Date End Date Taking? Authorizing Provider  amLODipine (NORVASC) 10 MG tablet TAKE 1 TABLET (10 MG TOTAL) BY MOUTH DAILY. (TAKE A HALF TO ONE TABLET DEPENDING ON BP READING) 10/05/13  Yes Unk Pinto, MD  Cholecalciferol (VITAMIN D3) 5000 UNITS CAPS Take 2 gelcaps (= 10,000 units daily) 01/04/13  Yes Unk Pinto, MD  promethazine-codeine (PHENERGAN WITH CODEINE) 6.25-10 MG/5ML syrup Take 5 mLs by mouth every 6 (six) hours as needed for cough. Max: 73mL per day Patient not taking: Reported on 03/10/2014 01/20/14   [name] Malta L Couillard, PA-C   Allergies  Allergen Reactions  . Ziac [Bisoprolol-Hydrochlorothiazide]     Flushing     Medications, allergies, past medical history, surgical history, family history, social history and problem list reviewed and updated.      Objective:   Physical Exam  Constitutional: She is oriented to person,  place, and time. She appears well-developed and well-nourished.  BP 120/80 mmHg  Pulse 89  Temp(Src) 97.9 F (36.6 C) (Oral)  Resp 18  Ht 5\' 1"  (1.549 m)  Wt 205 lb (92.987 kg)  BMI 38.75 kg/m2  SpO2 98%   HENT:  Head: Normocephalic and atraumatic.  Right Ear: External ear normal.  Left Ear: External ear normal.  Eyes: Conjunctivae are normal.  Neck: Normal range of motion.  Cardiovascular: Normal rate, regular rhythm and normal heart sounds.   No murmur heard. Pulmonary/Chest: Effort normal and breath sounds normal. She has no wheezes. She exhibits tenderness.    Musculoskeletal: Normal range of motion.       Right shoulder: Normal.       Left shoulder: She exhibits spasm (trapezius muscle spasm with TTP). She exhibits normal range of motion. Tenderness: mild anterior shoulder TTP.  Neurological: She is alert and oriented to person, place, and time.  Skin: Skin is warm and dry.  Redundant skin under axilla worse on the right than the left  Psychiatric: She has a normal mood and affect. Her behavior is normal. Judgment and thought content normal.   Xray read - NSR - no acute changes - D/w Dr Carlota Raspberry  Results for orders placed or performed in visit on 03/10/14  POCT glucose (manual entry)  Result Value Ref Range   POC Glucose 98 70 - 99 mg/dl  Assessment & Plan:  Chest pain, unspecified chest pain type - Plan: EKG 12-Lead, POCT glucose (manual entry)  Chest wall pain - Plan: diclofenac (VOLTAREN) 75 MG EC tablet, cyclobenzaprine (FLEXERIL) 5 MG tablet   Review her chart with her - it looks like she had a slightly elevated A1C and was recommended to watch her diet and we talked about that today - decrease simple sugars and white bread,Rice and pasta and that should help and losing some weight will also help with her decreased her glucose readings.  We are going to treat for MSK chest wall pain with NSAID and muscle relaxers for the muscle spasm - she will contact me  if she has further problems and seek medical evaluation if her pain changes.  She will contact her PCP for referral to surgeon to help with extra axilla skin/tissue that is current starting to bother her more.  Windell Hummingbird PA-C  [facility] 03/10/2014 3:33 PM

## 2014-04-04 ENCOUNTER — Ambulatory Visit (INDEPENDENT_AMBULATORY_CARE_PROVIDER_SITE_OTHER): Payer: BC Managed Care – PPO | Admitting: Physician Assistant

## 2014-04-04 ENCOUNTER — Encounter: Payer: Self-pay | Admitting: Physician Assistant

## 2014-04-04 VITALS — BP 132/72 | HR 72 | Temp 97.7°F | Resp 16 | Ht 60.0 in | Wt 204.0 lb

## 2014-04-04 DIAGNOSIS — D509 Iron deficiency anemia, unspecified: Secondary | ICD-10-CM

## 2014-04-04 DIAGNOSIS — Z79899 Other long term (current) drug therapy: Secondary | ICD-10-CM

## 2014-04-04 DIAGNOSIS — I10 Essential (primary) hypertension: Secondary | ICD-10-CM

## 2014-04-04 DIAGNOSIS — E782 Mixed hyperlipidemia: Secondary | ICD-10-CM

## 2014-04-04 DIAGNOSIS — E669 Obesity, unspecified: Secondary | ICD-10-CM

## 2014-04-04 DIAGNOSIS — E559 Vitamin D deficiency, unspecified: Secondary | ICD-10-CM

## 2014-04-04 DIAGNOSIS — E119 Type 2 diabetes mellitus without complications: Secondary | ICD-10-CM

## 2014-04-04 LAB — CBC WITH DIFFERENTIAL/PLATELET
BASOS ABS: 0 10*3/uL (ref 0.0–0.1)
Basophils Relative: 0 % (ref 0–1)
Eosinophils Absolute: 0.3 10*3/uL (ref 0.0–0.7)
Eosinophils Relative: 4 % (ref 0–5)
HCT: 42.2 % (ref 36.0–46.0)
Hemoglobin: 13.8 g/dL (ref 12.0–15.0)
LYMPHS PCT: 35 % (ref 12–46)
Lymphs Abs: 2.6 10*3/uL (ref 0.7–4.0)
MCH: 28 pg (ref 26.0–34.0)
MCHC: 32.7 g/dL (ref 30.0–36.0)
MCV: 85.8 fL (ref 78.0–100.0)
MPV: 10.1 fL (ref 8.6–12.4)
Monocytes Absolute: 0.6 10*3/uL (ref 0.1–1.0)
Monocytes Relative: 8 % (ref 3–12)
NEUTROS ABS: 3.9 10*3/uL (ref 1.7–7.7)
Neutrophils Relative %: 53 % (ref 43–77)
Platelets: 256 10*3/uL (ref 150–400)
RBC: 4.92 MIL/uL (ref 3.87–5.11)
RDW: 14.4 % (ref 11.5–15.5)
WBC: 7.4 10*3/uL (ref 4.0–10.5)

## 2014-04-04 LAB — HEMOGLOBIN A1C
Hgb A1c MFr Bld: 6.4 % — ABNORMAL HIGH (ref <5.7)
Mean Plasma Glucose: 137 mg/dL — ABNORMAL HIGH (ref <117)

## 2014-04-04 NOTE — Progress Notes (Signed)
Assessment and Plan:  1. Hypertension -Continue medication, monitor blood pressure at home. Continue DASH diet.  Reminder to go to the ER if any CP, SOB, nausea, dizziness, severe HA, changes vision/speech, left arm numbness and tingling and jaw pain.  2. Cholesterol -Continue diet and exercise. Check cholesterol.   3. Diabetes without complications -Continue diet and exercise. Check A1C  4. Vitamin D Def - check level and continue medications.   5. Obesity with co morbidities - long discussion about weight loss, diet, and exercise   Continue diet and meds as discussed. Further disposition pending results of labs. Discussed med's effects and SE's.   Over 30 minutes of exam, counseling, chart review, and critical decision making was performed   HPI 64 y.o. female  presents for 3 month follow up on hypertension, cholesterol, diabetes and vitamin D deficiency.   Her blood pressure has been controlled at home, today her BP is BP: 132/72 mmHg.  She does workout, walks sporadically. She denies chest pain, shortness of breath, dizziness.  She is not on cholesterol medication and denies myalgias. Her cholesterol is at goal. The cholesterol was:   Lab Results  Component Value Date   CHOL 146 09/16/2013   HDL 48 09/16/2013   LDLCALC 79 09/16/2013   TRIG 94 09/16/2013   CHOLHDL 3.0 09/16/2013   She has been working on diet and exercise for diabetes without complications, she is not on bASA, she is not on ACE/ARB, and denies  paresthesia of the feet, polydipsia, polyuria and visual disturbances. Last A1C was:  Lab Results  Component Value Date   HGBA1C 6.6* 09/16/2013  Patient is on Vitamin D supplement, 2000 IU daily Lab Results  Component Value Date   VD25OH 38 09/16/2013  BMI is Body mass index is 39.84 kg/(m^2)., she is working on diet and exercise. Wt Readings from Last 3 Encounters:  04/04/14 204 lb (92.534 kg)  03/10/14 205 lb (92.987 kg)  01/20/14 200 lb (90.719 kg)      Current Medications:  Current Outpatient Prescriptions on File Prior to Visit  Medication Sig Dispense Refill  . amLODipine (NORVASC) 10 MG tablet TAKE 1 TABLET (10 MG TOTAL) BY MOUTH DAILY. (TAKE A HALF TO ONE TABLET DEPENDING ON BP READING) 30 tablet 3  . Cholecalciferol (VITAMIN D3) 5000 UNITS CAPS Take 2 gelcaps (= 10,000 units daily) 30 capsule    No current facility-administered medications on file prior to visit.   Medical History:  Past Medical History  Diagnosis Date  . Hypertension   . Pre-diabetes   . Vitamin D deficiency   . Obesity    Allergies:  Allergies  Allergen Reactions  . Ziac [Bisoprolol-Hydrochlorothiazide]     Flushing     Review of Systems:  Review of Systems  Constitutional: Negative.   HENT: Negative.   Eyes: Negative.   Respiratory: Negative.   Cardiovascular: Negative.   Gastrointestinal: Negative.   Genitourinary: Negative.   Musculoskeletal: Negative.   Skin: Negative.   Neurological: Negative.   Endo/Heme/Allergies: Negative.   Psychiatric/Behavioral: Negative.     Family history- Review and unchanged Social history- Review and unchanged Physical Exam: BP 132/72 mmHg  Pulse 72  Temp(Src) 97.7 F (36.5 C)  Resp 16  Ht 5' (1.524 m)  Wt 204 lb (92.534 kg)  BMI 39.84 kg/m2 Wt Readings from Last 3 Encounters:  04/04/14 204 lb (92.534 kg)  03/10/14 205 lb (92.987 kg)  01/20/14 200 lb (90.719 kg)   General Appearance: Well nourished, in no  apparent distress. Eyes: PERRLA, EOMs, conjunctiva no swelling or erythema Sinuses: No Frontal/maxillary tenderness ENT/Mouth: Ext aud canals clear, TMs without erythema, bulging. No erythema, swelling, or exudate on post pharynx.  Tonsils not swollen or erythematous. Hearing normal.  Neck: Supple, thyroid normal.  Respiratory: Respiratory effort normal, BS equal bilaterally without rales, rhonchi, wheezing or stridor.  Cardio: RRR with no MRGs. Brisk peripheral pulses without edema.   Abdomen: Soft, + BS.  Non tender, no guarding, rebound, hernias, masses. Lymphatics: Non tender without lymphadenopathy.  Musculoskeletal: Full ROM, 5/5 strength, Normal gait Skin: Warm, dry without rashes, lesions, ecchymosis.  Neuro: Cranial nerves intact. No cerebellar symptoms.  Psych: Awake and oriented X 3, normal affect, Insight and Judgment appropriate.    [name], PA-C 11:07 AM [facility]

## 2014-04-04 NOTE — Patient Instructions (Addendum)
Add ENTERIC COATED low dose 81 mg Aspirin daily OR can do every other day if you have easy bruising to protect your heart and head.   ACE inhibitors are blood pressure medications that protect your heart and kidneys from sugars, if your sugars don't get better than we will stop your current medication and put you on this. It can cause two symptoms: The most common symptom is a dry cough/tickle in your throat that can happen the first day you take it or 5 years after you have been taking it. Please call us if you have this and we can switch it to a different medications. The least common side effect is called angioedema which is swelling of your lips and tongue and can cause problems with your breathing. This is a very very rare side effect but very serious. If this happens please stop the medication and go to the ER.    Your A1C is in the diabetic range, 09/16/2013: Hemoglobin-A1c 6.6*. Your A1C is a measure of your sugar over the past 3 months and is not affected by what you have eaten over the past few days. Diabetes increases your chances of stroke and heart attack over 300 % and is the leading cause of blindness and kidney failure in the [place]. Please make sure you decrease bad carbs like white bread, white rice, potatoes, corn, soft drinks, pasta, cereals, refined sugars, sweet tea, dried fruits, and fruit juice. Good carbs are okay to eat in moderation like sweet potatoes, brown rice, whole grain pasta/bread, most fruit (except dried fruit) and you can eat as many veggies as you want.   Greater than 6.5 is considered diabetic. Between 6.4 and 5.7 is prediabetic If your A1C is less than 5.7 you are NOT diabetic.  Targets for Glucose Readings: Time of Check Target for patients WITHOUT Diabetes Target for DIABETICS  Before Meals Less than 100  less than 150  Two hours after meals Less than 200  Less than 250   Diabetes is a very complicated disease...lets simplify it.  An easy way to  look at it to understand the complications is if you think of the extra sugar floating in your blood stream as glass shards floating through your blood stream.    Diabetes affects your small vessels first: 1) The glass shards (sugar) scraps down the tiny blood vessels in your eyes and lead to diabetic retinopathy, the leading cause of blindness in the [place]. Diabetes is the leading cause of newly diagnosed adult (32 to 65 years of age) blindness in the [place].  2) The glass shards scratches down the tiny vessels of your legs leading to nerve damage called neuropathy and can lead to amputations of your feet. More than 60% of all non-traumatic amputations of lower limbs occur in people with diabetes.  3) Over time the small vessels in your brain are shredded and closed off, individually this does not cause any problems but over a long period of time many of the small vessels being blocked can lead to Vascular Dementia.   4) Your kidney's are a filter system and have a "net" that keeps certain things in the body and lets bad things out. Sugar shreds this net and leads to kidney damage and eventually failure. Decreasing the sugar that is destroying the net and certain blood pressure medications can help stop or decrease progression of kidney disease. Diabetes was the primary cause of kidney failure in 44 percent of all new cases  in 2011.  5) Diabetes also destroys the small vessels in your penis that lead to erectile dysfunction. Eventually the vessels are so damaged that you may not be responsive to cialis or viagra.   Diabetes and your large vessels: Your larger vessels consist of your coronary arteries in your heart and the carotid vessels to your brain. Diabetes or even increased sugars put you at 300% increased risk of heart attack and stroke and this is why.. The sugar scrapes down your large blood vessels and your body sees this as an internal injury and tries to repair itself. Just like you  get a scab on your skin, your platelets will stick to the blood vessel wall trying to heal it. This is why we have diabetics on low dose aspirin daily, this prevents the platelets from sticking and can prevent plaque formation. In addition, your body takes cholesterol and tries to shove it into the open wound. This is why we want your LDL, or bad cholesterol, below 70.   The combination of platelets and cholesterol over 5-10 years forms plaque that can break off and cause a heart attack or stroke.   PLEASE REMEMBER:  Diabetes is preventable! Up to 46 percent of complications and morbidities among individuals with type 2 diabetes can be prevented, delayed, or effectively treated and minimized with regular visits to a health professional, appropriate monitoring and medication, and a healthy diet and lifestyle.     Bad carbs also include fruit juice, alcohol, and sweet tea. These are empty calories that do not signal to your brain that you are full.   Please remember the good carbs are still carbs which convert into sugar. So please measure them out no more than 1/2-1 cup of rice, oatmeal, pasta, and beans  Veggies are however free foods! Pile them on.   Not all fruit is created equal. Please see the list below, the fruit at the bottom is higher in sugars than the fruit at the top. Please avoid all dried fruits.    We want weight loss that will last so you should lose 1-2 pounds a week.  THAT IS IT! Please pick THREE things a month to change. Once it is a habit check off the item. Then pick another three items off the list to become habits.  If you are already doing a habit on the list GREAT!  Cross that item off! o Don't drink your calories. Ie, alcohol, soda, fruit juice, and sweet tea.  o Drink more water. Drink a glass when you feel hungry or before each meal.  o Eat breakfast - Complex carb and protein (likeDannon light and fit yogurt, oatmeal, fruit, eggs, [place] bacon). o Measure your  cereal.  Eat no more than one cup a day. (ie [place]) o Eat an apple a day. o Add a vegetable a day. o Try a new vegetable a month. o Use Pam! Stop using oil or butter to cook. o Don't finish your plate or use smaller plates. o Share your dessert. o Eat sugar free Jello for dessert or frozen grapes. o Don't eat 2-3 hours before bed. o Switch to whole wheat bread, pasta, and brown rice. o Make healthier choices when you eat out. No fries! o Pick baked chicken, NOT fried. o Don't forget to SLOW DOWN when you eat. It is not going anywhere.  o Take the stairs. o Park far away in the parking lot o [employer] (or weights) for 10 minutes while watching  TV. o Walk at work for 10 minutes during break. o Walk outside 1 time a week with your friend, kids, dog, or significant other. o Start a walking group at [place] as much as you can tolerate.  o Keep a food diary. o Weigh yourself daily. o Walk for 15 minutes 3 days per week. o Cook at home more often and eat out less.  If life happens and you go back to old habits, it is okay.  Just start over. You can do it!   If you experience chest pain, get short of breath, or tired during the exercise, please stop immediately and inform your doctor.

## 2014-04-05 LAB — LIPID PANEL
Cholesterol: 142 mg/dL (ref 0–200)
HDL: 47 mg/dL (ref >=46)
LDL CALC: 76 mg/dL (ref 0–99)
Total CHOL/HDL Ratio: 3 Ratio
Triglycerides: 96 mg/dL (ref <150)
VLDL: 19 mg/dL (ref 0–40)

## 2014-04-05 LAB — BASIC METABOLIC PANEL WITH GFR
BUN: 14 mg/dL (ref 6–23)
CO2: 28 mEq/L (ref 19–32)
Calcium: 9.6 mg/dL (ref 8.4–10.5)
Chloride: 106 mEq/L (ref 96–112)
Creat: 0.97 mg/dL (ref 0.50–1.10)
GFR, EST AFRICAN AMERICAN: 71 mL/min
GFR, EST NON AFRICAN AMERICAN: 62 mL/min
Glucose, Bld: 78 mg/dL (ref 70–99)
POTASSIUM: 4.3 meq/L (ref 3.5–5.3)
Sodium: 143 mEq/L (ref 135–145)

## 2014-04-05 LAB — HEPATIC FUNCTION PANEL
ALBUMIN: 3.9 g/dL (ref 3.5–5.2)
AST: 12 U/L (ref 0–37)
Alkaline Phosphatase: 69 U/L (ref 39–117)
BILIRUBIN INDIRECT: 0.5 mg/dL (ref 0.2–1.2)
Bilirubin, Direct: 0.1 mg/dL (ref 0.0–0.3)
TOTAL PROTEIN: 6.7 g/dL (ref 6.0–8.3)
Total Bilirubin: 0.6 mg/dL (ref 0.2–1.2)

## 2014-04-05 LAB — MAGNESIUM: Magnesium: 1.7 mg/dL (ref 1.5–2.5)

## 2014-04-05 LAB — TSH: TSH: 1.263 u[IU]/mL (ref 0.350–4.500)

## 2014-04-05 LAB — INSULIN, FASTING: Insulin fasting, serum: 22.6 u[IU]/mL — ABNORMAL HIGH (ref 2.0–19.6)

## 2014-04-05 LAB — VITAMIN D 25 HYDROXY (VIT D DEFICIENCY, FRACTURES): VIT D 25 HYDROXY: 25 ng/mL — AB (ref 30–100)

## 2014-05-10 ENCOUNTER — Other Ambulatory Visit: Payer: BC Managed Care – PPO

## 2014-05-10 DIAGNOSIS — N289 Disorder of kidney and ureter, unspecified: Secondary | ICD-10-CM

## 2014-05-11 LAB — BASIC METABOLIC PANEL WITH GFR
BUN: 12 mg/dL (ref 6–23)
CO2: 28 mEq/L (ref 19–32)
Calcium: 9.1 mg/dL (ref 8.4–10.5)
Chloride: 103 mEq/L (ref 96–112)
Creat: 0.76 mg/dL (ref 0.50–1.10)
GFR, EST NON AFRICAN AMERICAN: 83 mL/min
Glucose, Bld: 97 mg/dL (ref 70–99)
Potassium: 4 mEq/L (ref 3.5–5.3)
Sodium: 139 mEq/L (ref 135–145)

## 2014-05-12 ENCOUNTER — Other Ambulatory Visit: Payer: Self-pay | Admitting: Internal Medicine

## 2014-05-12 DIAGNOSIS — Z1231 Encounter for screening mammogram for malignant neoplasm of breast: Secondary | ICD-10-CM

## 2014-05-13 ENCOUNTER — Ambulatory Visit (HOSPITAL_COMMUNITY)
Admission: RE | Admit: 2014-05-13 | Discharge: 2014-05-13 | Disposition: A | Discharge status: Home / Self Care | Payer: BC Managed Care – PPO | Source: Ambulatory Visit | Attending: Internal Medicine | Admitting: Internal Medicine

## 2014-05-13 DIAGNOSIS — Z1231 Encounter for screening mammogram for malignant neoplasm of breast: Secondary | ICD-10-CM

## 2014-06-18 ENCOUNTER — Other Ambulatory Visit: Payer: Self-pay | Admitting: Internal Medicine

## 2014-07-20 ENCOUNTER — Encounter: Payer: Self-pay | Admitting: Physician Assistant

## 2014-09-15 ENCOUNTER — Encounter: Payer: Self-pay | Admitting: Internal Medicine

## 2014-09-21 ENCOUNTER — Other Ambulatory Visit: Payer: Self-pay | Admitting: Physician Assistant

## 2014-09-21 ENCOUNTER — Encounter: Payer: Self-pay | Admitting: Physician Assistant

## 2014-09-21 ENCOUNTER — Other Ambulatory Visit (HOSPITAL_COMMUNITY)
Admission: RE | Admit: 2014-09-21 | Discharge: 2014-09-21 | Disposition: A | Discharge status: Home / Self Care | Payer: BC Managed Care – PPO | Source: Ambulatory Visit | Attending: Physician Assistant | Admitting: Physician Assistant

## 2014-09-21 ENCOUNTER — Ambulatory Visit (INDEPENDENT_AMBULATORY_CARE_PROVIDER_SITE_OTHER): Payer: Medicare PPO | Admitting: Physician Assistant

## 2014-09-21 VITALS — BP 130/90 | HR 84 | Temp 97.5°F | Resp 16 | Ht 60.0 in | Wt 199.6 lb

## 2014-09-21 DIAGNOSIS — I10 Essential (primary) hypertension: Secondary | ICD-10-CM | POA: Diagnosis not present

## 2014-09-21 DIAGNOSIS — Z79899 Other long term (current) drug therapy: Secondary | ICD-10-CM

## 2014-09-21 DIAGNOSIS — Z1151 Encounter for screening for human papillomavirus (HPV): Secondary | ICD-10-CM | POA: Insufficient documentation

## 2014-09-21 DIAGNOSIS — N3000 Acute cystitis without hematuria: Secondary | ICD-10-CM

## 2014-09-21 DIAGNOSIS — E119 Type 2 diabetes mellitus without complications: Secondary | ICD-10-CM | POA: Diagnosis not present

## 2014-09-21 DIAGNOSIS — Z0001 Encounter for general adult medical examination with abnormal findings: Secondary | ICD-10-CM

## 2014-09-21 DIAGNOSIS — Z124 Encounter for screening for malignant neoplasm of cervix: Secondary | ICD-10-CM

## 2014-09-21 DIAGNOSIS — E669 Obesity, unspecified: Secondary | ICD-10-CM

## 2014-09-21 DIAGNOSIS — E559 Vitamin D deficiency, unspecified: Secondary | ICD-10-CM

## 2014-09-21 DIAGNOSIS — Z01419 Encounter for gynecological examination (general) (routine) without abnormal findings: Secondary | ICD-10-CM | POA: Insufficient documentation

## 2014-09-21 DIAGNOSIS — Z6838 Body mass index (BMI) 38.0-38.9, adult: Secondary | ICD-10-CM | POA: Diagnosis not present

## 2014-09-21 DIAGNOSIS — Z789 Other specified health status: Secondary | ICD-10-CM | POA: Diagnosis not present

## 2014-09-21 DIAGNOSIS — Z0000 Encounter for general adult medical examination without abnormal findings: Secondary | ICD-10-CM

## 2014-09-21 DIAGNOSIS — R6889 Other general symptoms and signs: Secondary | ICD-10-CM | POA: Diagnosis not present

## 2014-09-21 DIAGNOSIS — E782 Mixed hyperlipidemia: Secondary | ICD-10-CM

## 2014-09-21 DIAGNOSIS — Z9181 History of falling: Secondary | ICD-10-CM

## 2014-09-21 DIAGNOSIS — D509 Iron deficiency anemia, unspecified: Secondary | ICD-10-CM

## 2014-09-21 LAB — CBC WITH DIFFERENTIAL/PLATELET
BASOS ABS: 0 10*3/uL (ref 0.0–0.1)
Basophils Relative: 0 % (ref 0–1)
Eosinophils Absolute: 0.1 10*3/uL (ref 0.0–0.7)
Eosinophils Relative: 2 % (ref 0–5)
HEMATOCRIT: 40.8 % (ref 36.0–46.0)
HEMOGLOBIN: 13.4 g/dL (ref 12.0–15.0)
LYMPHS ABS: 2.1 10*3/uL (ref 0.7–4.0)
LYMPHS PCT: 31 % (ref 12–46)
MCH: 27.9 pg (ref 26.0–34.0)
MCHC: 32.8 g/dL (ref 30.0–36.0)
MCV: 85 fL (ref 78.0–100.0)
MONO ABS: 0.5 10*3/uL (ref 0.1–1.0)
MPV: 10.1 fL (ref 8.6–12.4)
Monocytes Relative: 8 % (ref 3–12)
NEUTROS ABS: 4 10*3/uL (ref 1.7–7.7)
Neutrophils Relative %: 59 % (ref 43–77)
Platelets: 262 10*3/uL (ref 150–400)
RBC: 4.8 MIL/uL (ref 3.87–5.11)
RDW: 14.2 % (ref 11.5–15.5)
WBC: 6.8 10*3/uL (ref 4.0–10.5)

## 2014-09-21 NOTE — Progress Notes (Signed)
WELCOME TO MEDICARE VISIT  Assessment:   1. Essential hypertension - continue medications, DASH diet, exercise and monitor at home. Call if greater than 130/80.  - CBC with Differential/Platelet - BASIC METABOLIC PANEL WITH GFR - Hepatic function panel - TSH - Urinalysis, Routine w reflex microscopic (not at [facility]) - Microalbumin / creatinine urine ratio - EKG 12-Lead - [place], RETROPERITNL ABD,  LTD  2. Diabetes mellitus without complication Discussed general issues about diabetes pathophysiology and management., Educational material distributed., Suggested low cholesterol diet., Encouraged aerobic exercise., Discussed foot care., Reminded to get yearly retinal exam. - Hemoglobin A1c - Insulin, fasting - HM DIABETES FOOT EXAM  3. Mixed hyperlipidemia -continue medications, check lipids, decrease fatty foods, increase activity.  - Lipid panel  4. Vitamin D deficiency - Vit D  25 hydroxy (rtn osteoporosis monitoring)  5. Anemia, iron deficiency - monitor, continue iron supp with Vitamin C and increase green leafy veggies - Iron and TIBC - Ferritin - Vitamin B12  6. Obesity Obesity with co morbidities- long discussion about weight loss, diet, and exercise  7. Medication management - Magnesium  8. Encounter for Medicare annual wellness exam  9. At low risk for fall  10. Screening for cervical cancer - Cytology - PAP  Over 40 minutes of exam, counseling, chart review and critical decision making was performed  Plan:   During the course of the visit the patient was educated and counseled about appropriate screening and preventive services including:    Pneumococcal vaccine   Prevnar 13  Influenza vaccine  Td vaccine  Screening electrocardiogram  Bone densitometry screening  Colorectal cancer screening  Diabetes screening  Glaucoma screening  Nutrition counseling   Advanced directives: requested  Conditions/risks identified: BMI: Discussed weight  loss, diet, and increase physical activity.  Increase physical activity: AHA recommends 150 minutes of physical activity a week.  Medications reviewed Diabetes is at goal, ACE/ARB therapy: No, Reason not on Ace Inhibitor/ARB therapy:  preDM Urinary Incontinence is not an issue: discussed non pharmacology and pharmacology options.  Fall risk: low- discussed PT, home fall assessment, medications.    Subjective:  Brittney Gray is a 64 y.o. AA female who presents for WELCOME to Medicare Annual Wellness Visit and complete physical.    She has had elevated blood pressure for 5-6 years. Her blood pressure has been controlled at home, today their BP is BP: 130/90 mmHg She does workout, she is walking when weather permits, and she just got silver sneakers. She denies chest pain, shortness of breath, dizziness.  She is not on cholesterol medication and denies myalgias. Her cholesterol is at goal. The cholesterol last visit was:   Lab Results  Component Value Date   CHOL 142 04/04/2014   HDL 47 04/04/2014   LDLCALC 76 04/04/2014   TRIG 96 04/04/2014   CHOLHDL 3.0 04/04/2014    She has been working on diet and exercise for prediabetes, and denies polydipsia and polyuria. Last A1C in the office was:  Lab Results  Component Value Date   HGBA1C 6.4* 04/04/2014   Patient is on Vitamin D supplement, only on 1000 IU daily.   Lab Results  Component Value Date   VD25OH 25* 04/04/2014     She is retired from school system, is divorced, her son is 76 and she has 73 year old granddaughter Has had anemia in the past, had recent EGD and colonoscopy on Monday, had polyps removed Lab Results  Component Value Date   IRON 59 09/16/2013  TIBC 346 09/16/2013   FERRITIN 54 09/16/2013   BMI is Body mass index is 38.98 kg/(m^2)., she is working on diet and exercise. Wt Readings from Last 3 Encounters:  09/21/14 199 lb 9.6 oz (90.538 kg)  04/04/14 204 lb (92.534 kg)  03/10/14 205 lb (92.987 kg)      Medication Review: Current Outpatient Prescriptions on File Prior to Visit  Medication Sig Dispense Refill  . amLODipine (NORVASC) 10 MG tablet TAKE 1 TABLET (10 MG TOTAL) BY MOUTH DAILY. (TAKE A HALF TO ONE TABLET DEPENDING ON BP READING) 90 tablet 1  . Cholecalciferol (VITAMIN D3) 5000 UNITS CAPS Take 2 gelcaps (= 10,000 units daily) 30 capsule    No current facility-administered medications on file prior to visit.    Current Problems (verified) Patient Active Problem List   Diagnosis Date Noted  . Encounter for Medicare annual wellness exam 09/21/2014  . Obesity 09/16/2013  . Anemia, iron deficiency 09/16/2013  . Diabetes mellitus without complication 88/82/8003  . Essential hypertension 01/03/2013  . Mixed hyperlipidemia 01/03/2013  . Vitamin D deficiency 01/03/2013    Screening Tests Immunization History  Administered Date(s) Administered  . PPD Test 04/26/2013    Preventative care: Last colonoscopy: 09/2014 EGD: 09/2014 Dr. Collene Mares Last mammogram: 2016 Last pap smear/pelvic exam: 2013  Due this year DEXA: 2013 will get at age 64 Results for orders placed during the hospital encounter of 05/13/14  MM DIGITAL SCREENING BILATERAL   Narrative CLINICAL DATA:  Screening.  EXAM: DIGITAL SCREENING BILATERAL MAMMOGRAM WITH CAD  COMPARISON:  Previous exam(s).  ACR Breast Density Category b: There are scattered areas of fibroglandular density.  FINDINGS: There are no findings suspicious for malignancy. Images were processed with CAD.  IMPRESSION: No mammographic evidence of malignancy. A result letter of this screening mammogram will be mailed directly to the patient.  RECOMMENDATION: Screening mammogram in one year. (Code:SM-B-01Y)  BI-RADS CATEGORY  1: Negative.   Electronically Signed   By: Abelardo Diesel M.D.   On: 05/16/2014 08:14     Prior vaccinations: TD or Tdap: 2009  Influenza: declines Pneumococcal: declines Prevnar13: at age  64 Shingles/Zostavax: declines  Names of Other Physician/Practitioners you currently use: 1. [facility] here for primary care 2. [place], eye doctor, last visit 1 year, she is due, wears reading glasses 3. Dr. Evette Doffing, dentist, last visit 3 months ago Patient Care Team: Unk Pinto, MD as PCP - General (Internal Medicine) Juanita Craver, MD as Consulting Physician (Gastroenterology)   SURGICAL HISTORY Past Surgical History  Procedure Laterality Date  . Cesarean section  1971  . Knee arthroscopy Right   . Carpal tunnel release Right   . Tubal ligation     FAMILY HISTORY Family History  Problem Relation Age of Onset  . Stroke Mother   . Hypertension Sister   . Thyroid disease Sister   . Aneurysm Sister   . Heart disease Brother   . Diabetes Brother   . Heart disease Brother   . Diabetes Brother    SOCIAL HISTORY Social History  Substance Use Topics  . Smoking status: Never Smoker   . Smokeless tobacco: Never Used  . Alcohol Use: No    MEDICARE WELLNESS OBJECTIVES: Tobacco use: She does not smoke.  Patient is not a former smoker. If yes, counseling given Alcohol Current alcohol use: none Osteoporosis: postmenopausal estrogen deficiency and dietary calcium and/or vitamin D deficiency, History of fracture in the past year: no Fall risk: Low Risk  Hearing: normal Visual acuity: impaired,  does perform annual eye exam Diet: in general, a "healthy" diet   Physical activity: Current Exercise Habits:: The patient does not participate in regular exercise at present Cardiac risk factors: Cardiac Risk Factors include: dyslipidemia;hypertension;obesity (BMI >30kg/m2);sedentary lifestyle Depression/mood screen:   Depression screen [facility] 2/9 09/21/2014  Decreased Interest 0  Down, Depressed, Hopeless 0  PHQ - 2 Score 0    ADLs:  In your present state of health, do you have any difficulty performing the following activities: 09/21/2014   Hearing? N  Vision? Y  Difficulty concentrating or making decisions? N  Walking or climbing stairs? N  Dressing or bathing? N  Doing errands, shopping? N  Preparing Food and eating ? N  Using the Toilet? N  In the past six months, have you accidently leaked urine? N  Do you have problems with loss of bowel control? N  Managing your Medications? N  Managing your Finances? N  Housekeeping or managing your Housekeeping? N     Cognitive Testing  Alert? Yes  Normal Appearance?Yes  Oriented to person? Yes  Place? Yes   Time? Yes  Recall of three objects?  Yes  Can perform simple calculations? Yes  Displays appropriate judgment?Yes  Can read the correct time from a watch face?Yes  EOL planning: Does patient have an advance directive?: No Would patient like information on creating an advanced directive?: Yes - Educational materials given  Review of Systems  Constitutional: Negative.   HENT: Negative.   Eyes: Negative.   Respiratory: Positive for cough (non productive, better since last week. ). Negative for hemoptysis, sputum production, shortness of breath and wheezing.   Cardiovascular: Negative.   Gastrointestinal: Positive for heartburn (on prilosec from Dr. Collene Mares). Negative for nausea, vomiting, abdominal pain, diarrhea, constipation, blood in stool and melena.  Genitourinary: Negative.   Musculoskeletal: Positive for back pain. Negative for myalgias, joint pain, falls and neck pain.  Skin: Negative.   Neurological: Negative.   Endo/Heme/Allergies: Negative.   Psychiatric/Behavioral: Negative.      Objective:     Today's Vitals   09/21/14 0856  BP: 130/90  Pulse: 84  Temp: 97.5 F (36.4 C)  Resp: 16  Height: 5' (1.524 m)  Weight: 199 lb 9.6 oz (90.538 kg)   Body mass index is 38.98 kg/(m^2).  General appearance: alert, no distress, WD/WN, female HEENT: normocephalic, sclerae anicteric, TMs pearly, nares patent, no discharge or erythema, pharynx normal Oral  cavity: MMM, no lesions Neck: supple, no lymphadenopathy, no thyromegaly, no masses Heart: RRR, normal S1, S2, no murmurs Lungs: CTA bilaterally, no wheezes, rhonchi, or rales Abdomen: +bs, soft, obese, non tender, non distended, no masses, no hepatomegaly, no splenomegaly Musculoskeletal: nontender, no swelling, no obvious deformity Extremities: no edema, no cyanosis, no clubbing Pulses: 2+ symmetric, upper and lower extremities, normal cap refill Neurological: alert, oriented x 3, CN2-12 intact, strength normal upper extremities and lower extremities, sensation normal throughout, DTRs 2+ throughout, no cerebellar signs, gait normal Psychiatric: normal affect, behavior normal, pleasant  normal external genitalia, vulva, vagina, cervix, uterus and adnexa, PAP: Pap smear done today. Breasts: breasts appear normal, no suspicious masses, no skin or nipple changes or axillary nodes.   Medicare Attestation I have personally reviewed: The patient's medical and social history Their use of alcohol, tobacco or illicit drugs Their current medications and supplements The patient's functional ability including ADLs,fall risks, home safety risks, cognitive, and hearing and visual impairment Diet and physical activities Evidence for depression or  mood disorders  The patient's weight, height, BMI, and visual acuity have been recorded in the chart.  I have made referrals, counseling, and provided education to the patient based on review of the above and I have provided the patient with a written personalized care plan for preventive services.     [name], PA-C   09/21/2014

## 2014-09-21 NOTE — Patient Instructions (Signed)
Preventive Care for Adults A healthy lifestyle and preventive care can promote health and wellness. Preventive health guidelines for women include the following key practices.  A routine yearly physical is a good way to check with your health care provider about your health and preventive screening. It is a chance to share any concerns and updates on your health and to receive a thorough exam.  Visit your dentist for a routine exam and preventive care every 6 months. Brush your teeth twice a day and floss once a day. Good oral hygiene prevents tooth decay and gum disease.  The frequency of eye exams is based on your age, health, family medical history, use of contact lenses, and other factors. Follow your health care provider's recommendations for frequency of eye exams.  Eat a healthy diet. Foods like vegetables, fruits, whole grains, low-fat dairy products, and lean protein foods contain the nutrients you need without too many calories. Decrease your intake of foods high in solid fats, added sugars, and salt. Eat the right amount of calories for you.Get information about a proper diet from your health care provider, if necessary.  Regular physical exercise is one of the most important things you can do for your health. Most adults should get at least 150 minutes of moderate-intensity exercise (any activity that increases your heart rate and causes you to sweat) each week. In addition, most adults need muscle-strengthening exercises on 2 or more days a week.  Maintain a healthy weight. The body mass index (BMI) is a screening tool to identify possible weight problems. It provides an estimate of body fat based on height and weight. Your health care provider can find your BMI and can help you achieve or maintain a healthy weight.For adults 20 years and older:  A BMI below 18.5 is considered underweight.  A BMI of 18.5 to 24.9 is normal.  A BMI of 25 to 29.9 is considered overweight.  A BMI of  30 and above is considered obese.  Maintain normal blood lipids and cholesterol levels by exercising and minimizing your intake of saturated fat. Eat a balanced diet with plenty of fruit and vegetables. If your lipid or cholesterol levels are high, you are over 50, or you are at high risk for heart disease, you may need your cholesterol levels checked more frequently.Ongoing high lipid and cholesterol levels should be treated with medicines if diet and exercise are not working.  If you smoke, find out from your health care provider how to quit. If you do not use tobacco, do not start.  Lung cancer screening is recommended for adults aged 86-80 years who are at high risk for developing lung cancer because of a history of smoking. A yearly low-dose CT scan of the lungs is recommended for people who have at least a 30-pack-year history of smoking and are a current smoker or have quit within the past 15 years. A pack year of smoking is smoking an average of 1 pack of cigarettes a day for 1 year (for example: 1 pack a day for 30 years or 2 packs a day for 15 years). Yearly screening should continue until the smoker has stopped smoking for at least 15 years. Yearly screening should be stopped for people who develop a health problem that would prevent them from having lung cancer treatment.  Avoid use of street drugs. Do not share needles with anyone. Ask for help if you need support or instructions about stopping the use of drugs.  High blood  pressure causes heart disease and increases the risk of stroke.  Ongoing high blood pressure should be treated with medicines if weight loss and exercise do not work.  If you are 1-31 years old, ask your health care provider if you should take aspirin to prevent strokes.  Diabetes screening involves taking a blood sample to check your fasting blood sugar level. This should be done once every 3 years, after age 25, if you are within normal weight and without risk  factors for diabetes. Testing should be considered at a younger age or be carried out more frequently if you are overweight and have at least 1 risk factor for diabetes.  Breast cancer screening is essential preventive care for women. You should practice "breast self-awareness." This means understanding the normal appearance and feel of your breasts and may include breast self-examination. Any changes detected, no matter how small, should be reported to a health care provider. Women in their 64s and 30s should have a clinical breast exam (CBE) by a health care provider as part of a regular health exam every 1 to 3 years. After age 9, women should have a CBE every year. Starting at age 48, women should consider having a mammogram (breast X-ray test) every year. Women who have a family history of breast cancer should talk to their health care provider about genetic screening. Women at a high risk of breast cancer should talk to their health care providers about having an MRI and a mammogram every year.  Breast cancer gene (BRCA)-related cancer risk assessment is recommended for women who have family members with BRCA-related cancers. BRCA-related cancers include breast, ovarian, tubal, and peritoneal cancers. Having family members with these cancers may be associated with an increased risk for harmful changes (mutations) in the breast cancer genes BRCA1 and BRCA2. Results of the assessment will determine the need for genetic counseling and BRCA1 and BRCA2 testing.  Routine pelvic exams to screen for cancer are no longer recommended for nonpregnant women who are considered low risk for cancer of the pelvic organs (ovaries, uterus, and vagina) and who do not have symptoms. Ask your health care provider if a screening pelvic exam is right for you.  If you have had past treatment for cervical cancer or a condition that could lead to cancer, you need Pap tests and screening for cancer for at least 20 years after  your treatment. If Pap tests have been discontinued, your risk factors (such as having a new sexual partner) need to be reassessed to determine if screening should be resumed. Some women have medical problems that increase the chance of getting cervical cancer. In these cases, your health care provider may recommend more frequent screening and Pap tests.    Colorectal cancer can be detected and often prevented. Most routine colorectal cancer screening begins at the age of 28 years and continues through age 19 years. However, your health care provider may recommend screening at an earlier age if you have risk factors for colon cancer. On a yearly basis, your health care provider may provide home test kits to check for hidden blood in the stool. Use of a small camera at the end of a tube, to directly examine the colon (sigmoidoscopy or colonoscopy), can detect the earliest forms of colorectal cancer. Talk to your health care provider about this at age 46, when routine screening begins. Direct exam of the colon should be repeated every 5-10 years through age 43 years, unless early forms of pre-cancerous polyps  or small growths are found.  Osteoporosis is a disease in which the bones lose minerals and strength with aging. This can result in serious bone fractures or breaks. The risk of osteoporosis can be identified using a bone density scan. Women ages 62 years and over and women at risk for fractures or osteoporosis should discuss screening with their health care providers. Ask your health care provider whether you should take a calcium supplement or vitamin D to reduce the rate of osteoporosis.  Menopause can be associated with physical symptoms and risks. Hormone replacement therapy is available to decrease symptoms and risks. You should talk to your health care provider about whether hormone replacement therapy is right for you.  Use sunscreen. Apply sunscreen liberally and repeatedly throughout the day.  You should seek shade when your shadow is shorter than you. Protect yourself by wearing long sleeves, pants, a wide-brimmed hat, and sunglasses year round, whenever you are outdoors.  Once a month, do a whole body skin exam, using a mirror to look at the skin on your back. Tell your health care provider of new moles, moles that have irregular borders, moles that are larger than a pencil eraser, or moles that have changed in shape or color.  Stay current with required vaccines (immunizations).  Influenza vaccine. All adults should be immunized every year.  Tetanus, diphtheria, and acellular pertussis (Td, Tdap) vaccine. Pregnant women should receive 1 dose of Tdap vaccine during each pregnancy. The dose should be obtained regardless of the length of time since the last dose. Immunization is preferred during the 27th-36th week of gestation. An adult who has not previously received Tdap or who does not know her vaccine status should receive 1 dose of Tdap. This initial dose should be followed by tetanus and diphtheria toxoids (Td) booster doses every 10 years. Adults with an unknown or incomplete history of completing a 3-dose immunization series with Td-containing vaccines should begin or complete a primary immunization series including a Tdap dose. Adults should receive a Td booster every 10 years.    Zoster vaccine. One dose is recommended for adults aged 4 years or older unless certain conditions are present.    Pneumococcal 13-valent conjugate (PCV13) vaccine. When indicated, a person who is uncertain of her immunization history and has no record of immunization should receive the PCV13 vaccine. An adult aged 35 years or older who has certain medical conditions and has not been previously immunized should receive 1 dose of PCV13 vaccine. This PCV13 should be followed with a dose of pneumococcal polysaccharide (PPSV23) vaccine. The PPSV23 vaccine dose should be obtained at least 8 weeks after the  dose of PCV13 vaccine. An adult aged 85 years or older who has certain medical conditions and previously received 1 or more doses of PPSV23 vaccine should receive 1 dose of PCV13. The PCV13 vaccine dose should be obtained 1 or more years after the last PPSV23 vaccine dose.    Pneumococcal polysaccharide (PPSV23) vaccine. When PCV13 is also indicated, PCV13 should be obtained first. All adults aged 15 years and older should be immunized. An adult younger than age 38 years who has certain medical conditions should be immunized. Any person who resides in a nursing home or long-term care facility should be immunized. An adult smoker should be immunized. People with an immunocompromised condition and certain other conditions should receive both PCV13 and PPSV23 vaccines. People with human immunodeficiency virus (HIV) infection should be immunized as soon as possible after diagnosis. Immunization during  chemotherapy or radiation therapy should be avoided. Routine use of PPSV23 vaccine is not recommended for American Indians, [place], or people younger than 65 years unless there are medical conditions that require PPSV23 vaccine. When indicated, people who have unknown immunization and have no record of immunization should receive PPSV23 vaccine. One-time revaccination 5 years after the first dose of PPSV23 is recommended for people aged 19-64 years who have chronic kidney failure, nephrotic syndrome, asplenia, or immunocompromised conditions. People who received 1-2 doses of PPSV23 before age 68 years should receive another dose of PPSV23 vaccine at age 34 years or later if at least 5 years have passed since the previous dose. Doses of PPSV23 are not needed for people immunized with PPSV23 at or after age 17 years.   Preventive Services / Frequency  Ages 75 years and over  Blood pressure check.  Lipid and cholesterol check.  Lung cancer screening. / Every year if you are aged 7-80 years and have a  30-pack-year history of smoking and currently smoke or have quit within the past 15 years. Yearly screening is stopped once you have quit smoking for at least 15 years or develop a health problem that would prevent you from having lung cancer treatment.  Clinical breast exam.** / Every year after age 58 years.  BRCA-related cancer risk assessment.** / For women who have family members with a BRCA-related cancer (breast, ovarian, tubal, or peritoneal cancers).  Mammogram.** / Every year beginning at age 71 years and continuing for as long as you are in good health. Consult with your health care provider.  Pap test.** / Every 3 years starting at age 30 years through age 54 or 60 years with 3 consecutive normal Pap tests. Testing can be stopped between 65 and 70 years with 3 consecutive normal Pap tests and no abnormal Pap or HPV tests in the past 10 years.  Fecal occult blood test (FOBT) of stool. / Every year beginning at age 75 years and continuing until age 13 years. You may not need to do this test if you get a colonoscopy every 10 years.  Flexible sigmoidoscopy or colonoscopy.** / Every 5 years for a flexible sigmoidoscopy or every 10 years for a colonoscopy beginning at age 51 years and continuing until age 34 years.  Hepatitis C blood test.** / For all people born from [birth year] through 1966 and any individual with known risks for hepatitis C.  Osteoporosis screening.** / A one-time screening for women ages 68 years and over and women at risk for fractures or osteoporosis.  Skin self-exam. / Monthly.  Influenza vaccine. / Every year.  Tetanus, diphtheria, and acellular pertussis (Tdap/Td) vaccine.** / 1 dose of Td every 10 years.  Zoster vaccine.** / 1 dose for adults aged 70 years or older.  Pneumococcal 13-valent conjugate (PCV13) vaccine.** / Consult your health care provider.  Pneumococcal polysaccharide (PPSV23) vaccine.** / 1 dose for all adults aged 58 years and older. Screening  for abdominal aortic aneurysm (AAA)  by ultrasound is recommended for people who have history of high blood pressure or who are current or former smokers.

## 2014-09-22 LAB — LIPID PANEL
Cholesterol: 136 mg/dL (ref 125–200)
HDL: 47 mg/dL (ref >=46)
LDL CALC: 71 mg/dL (ref <130)
TRIGLYCERIDES: 90 mg/dL (ref <150)
Total CHOL/HDL Ratio: 2.9 Ratio (ref <=5.0)
VLDL: 18 mg/dL (ref <30)

## 2014-09-22 LAB — URINALYSIS, MICROSCOPIC ONLY
CASTS: NONE SEEN [LPF]
CRYSTALS: NONE SEEN [HPF]
Yeast: NONE SEEN [HPF]

## 2014-09-22 LAB — URINALYSIS, ROUTINE W REFLEX MICROSCOPIC
BILIRUBIN URINE: NEGATIVE
Glucose, UA: NEGATIVE
KETONES UR: NEGATIVE
NITRITE: POSITIVE — AB
PH: 6 (ref 5.0–8.0)
Protein, ur: NEGATIVE
Specific Gravity, Urine: 1.02 (ref 1.001–1.035)

## 2014-09-22 LAB — BASIC METABOLIC PANEL WITH GFR
BUN: 9 mg/dL (ref 7–25)
CO2: 27 mmol/L (ref 20–31)
Calcium: 9.1 mg/dL (ref 8.6–10.4)
Chloride: 104 mmol/L (ref 98–110)
Creat: 0.87 mg/dL (ref 0.50–0.99)
GFR, EST NON AFRICAN AMERICAN: 71 mL/min (ref >=60)
GFR, Est African American: 81 mL/min (ref >=60)
GLUCOSE: 109 mg/dL — AB (ref 65–99)
POTASSIUM: 3.6 mmol/L (ref 3.5–5.3)
Sodium: 140 mmol/L (ref 135–146)

## 2014-09-22 LAB — HEPATIC FUNCTION PANEL
ALK PHOS: 67 U/L (ref 33–130)
AST: 12 U/L (ref 10–35)
Albumin: 4 g/dL (ref 3.6–5.1)
BILIRUBIN INDIRECT: 0.4 mg/dL (ref 0.2–1.2)
Bilirubin, Direct: 0.1 mg/dL (ref <=0.2)
TOTAL PROTEIN: 6.6 g/dL (ref 6.1–8.1)
Total Bilirubin: 0.5 mg/dL (ref 0.2–1.2)

## 2014-09-22 LAB — VITAMIN B12: Vitamin B-12: 593 pg/mL (ref 211–911)

## 2014-09-22 LAB — MICROALBUMIN / CREATININE URINE RATIO
CREATININE, URINE: 216.2 mg/dL
Microalb Creat Ratio: 5.1 mg/g (ref 0.0–30.0)
Microalb, Ur: 1.1 mg/dL (ref <2.0)

## 2014-09-22 LAB — FERRITIN: Ferritin: 63 ng/mL (ref 10–291)

## 2014-09-22 LAB — IRON AND TIBC
%SAT: 20 % (ref 11–50)
Iron: 62 ug/dL (ref 45–160)
TIBC: 315 ug/dL (ref 250–450)
UIBC: 253 ug/dL (ref 125–400)

## 2014-09-22 LAB — VITAMIN D 25 HYDROXY (VIT D DEFICIENCY, FRACTURES): Vit D, 25-Hydroxy: 32 ng/mL (ref 30–100)

## 2014-09-22 LAB — INSULIN, FASTING: INSULIN FASTING, SERUM: 75.1 u[IU]/mL — AB (ref 2.0–19.6)

## 2014-09-22 LAB — TSH: TSH: 1.106 u[IU]/mL (ref 0.350–4.500)

## 2014-09-22 LAB — MAGNESIUM: Magnesium: 1.7 mg/dL (ref 1.5–2.5)

## 2014-09-22 LAB — CYTOLOGY - PAP

## 2014-09-22 LAB — HEMOGLOBIN A1C
HEMOGLOBIN A1C: 6.4 % — AB (ref <5.7)
MEAN PLASMA GLUCOSE: 137 mg/dL — AB (ref <117)

## 2014-09-24 LAB — URINE CULTURE

## 2014-09-26 MED ORDER — NITROFURANTOIN MONOHYD MACRO 100 MG PO CAPS: 100.0000 mg | ORAL_CAPSULE | Freq: Two times a day (BID) | ORAL | Status: AC

## 2014-09-26 NOTE — Addendum Note (Signed)
Addended by: [name] R on: 09/26/2014 11:53 AM   Modules accepted: Orders

## 2014-11-27 ENCOUNTER — Encounter: Payer: Self-pay | Admitting: *Deleted

## 2015-03-29 ENCOUNTER — Ambulatory Visit: Payer: Self-pay | Admitting: Internal Medicine

## 2015-04-12 ENCOUNTER — Ambulatory Visit (INDEPENDENT_AMBULATORY_CARE_PROVIDER_SITE_OTHER): Payer: Medicare Other | Admitting: Internal Medicine

## 2015-04-12 ENCOUNTER — Encounter: Payer: Self-pay | Admitting: Internal Medicine

## 2015-04-12 VITALS — BP 130/88 | HR 84 | Temp 97.3°F | Resp 16 | Ht 60.0 in | Wt 205.0 lb

## 2015-04-12 DIAGNOSIS — E119 Type 2 diabetes mellitus without complications: Secondary | ICD-10-CM

## 2015-04-12 DIAGNOSIS — I10 Essential (primary) hypertension: Secondary | ICD-10-CM

## 2015-04-12 DIAGNOSIS — E669 Obesity, unspecified: Secondary | ICD-10-CM

## 2015-04-12 DIAGNOSIS — E559 Vitamin D deficiency, unspecified: Secondary | ICD-10-CM

## 2015-04-12 DIAGNOSIS — Z79899 Other long term (current) drug therapy: Secondary | ICD-10-CM | POA: Diagnosis not present

## 2015-04-12 DIAGNOSIS — E782 Mixed hyperlipidemia: Secondary | ICD-10-CM | POA: Diagnosis not present

## 2015-04-12 LAB — CBC WITH DIFFERENTIAL/PLATELET
BASOS ABS: 67 {cells}/uL (ref 0–200)
Basophils Relative: 1 %
EOS ABS: 134 {cells}/uL (ref 15–500)
EOS PCT: 2 %
HEMATOCRIT: 42.8 % (ref 35.0–45.0)
HEMOGLOBIN: 13.9 g/dL (ref 11.7–15.5)
LYMPHS ABS: 2345 {cells}/uL (ref 850–3900)
Lymphocytes Relative: 35 %
MCH: 27.4 pg (ref 27.0–33.0)
MCHC: 32.5 g/dL (ref 32.0–36.0)
MCV: 84.4 fL (ref 80.0–100.0)
MPV: 10.5 fL (ref 7.5–12.5)
Monocytes Absolute: 402 cells/uL (ref 200–950)
Monocytes Relative: 6 %
NEUTROS PCT: 56 %
Neutro Abs: 3752 cells/uL (ref 1500–7800)
Platelets: 273 10*3/uL (ref 140–400)
RBC: 5.07 MIL/uL (ref 3.80–5.10)
RDW: 14.7 % (ref 11.0–15.0)
WBC: 6.7 10*3/uL (ref 3.8–10.8)

## 2015-04-12 LAB — BASIC METABOLIC PANEL WITH GFR
BUN: 14 mg/dL (ref 7–25)
CO2: 26 mmol/L (ref 20–31)
CREATININE: 0.82 mg/dL (ref 0.50–0.99)
Calcium: 9.6 mg/dL (ref 8.6–10.4)
Chloride: 105 mmol/L (ref 98–110)
GFR, EST AFRICAN AMERICAN: 87 mL/min (ref >=60)
GFR, Est Non African American: 75 mL/min (ref >=60)
Glucose, Bld: 97 mg/dL (ref 65–99)
POTASSIUM: 4.1 mmol/L (ref 3.5–5.3)
SODIUM: 140 mmol/L (ref 135–146)

## 2015-04-12 LAB — TSH: TSH: 1.96 mIU/L

## 2015-04-12 LAB — HEMOGLOBIN A1C
Hgb A1c MFr Bld: 6.5 % — ABNORMAL HIGH (ref <5.7)
Mean Plasma Glucose: 140 mg/dL

## 2015-04-12 LAB — LIPID PANEL
CHOL/HDL RATIO: 3 ratio (ref <=5.0)
Cholesterol: 159 mg/dL (ref 125–200)
HDL: 53 mg/dL (ref >=46)
LDL Cholesterol: 88 mg/dL (ref <130)
TRIGLYCERIDES: 88 mg/dL (ref <150)
VLDL: 18 mg/dL (ref <30)

## 2015-04-12 LAB — HEPATIC FUNCTION PANEL
ALBUMIN: 4 g/dL (ref 3.6–5.1)
ALT: 3 U/L — ABNORMAL LOW (ref 6–29)
AST: 14 U/L (ref 10–35)
Alkaline Phosphatase: 73 U/L (ref 33–130)
BILIRUBIN DIRECT: 0.1 mg/dL (ref <=0.2)
BILIRUBIN TOTAL: 0.5 mg/dL (ref 0.2–1.2)
Indirect Bilirubin: 0.4 mg/dL (ref 0.2–1.2)
Total Protein: 7 g/dL (ref 6.1–8.1)

## 2015-04-12 LAB — MAGNESIUM: MAGNESIUM: 1.7 mg/dL (ref 1.5–2.5)

## 2015-04-12 MED ORDER — VITAMIN D3 25 MCG (1000 UT) PO CAPS: ORAL_CAPSULE | ORAL | Status: DC

## 2015-04-12 NOTE — Progress Notes (Signed)
Patient ID: Brittney Gray, female   DOB: 06-Sep-1950, 65 y.o.   MRN: [id number]   This very nice 65 y.o. DBF presents for 3 month follow up with Hypertension, Hyperlipidemia, Pre-Diabetes and Vitamin D Deficiency.    Patient is treated for HTN circa 2002 & BP has been controlled at home. Today's BP: 130/88 mmHg. Patient has had no complaints of any cardiac type chest pain, palpitations, dyspnea/orthopnea/PND, dizziness, claudication, or dependent edema.   Hyperlipidemia is controlled with diet & meds. Patient denies myalgias or other med SE's. Last Lipids were  Cholesterol 136; HDL 47; LDL 71; Triglycerides 90 on 09/21/2014.   Also, the patient has history of Morbid Obesity (BMI 40+) and consequent T2_NIDDM marginally controlled with diet and has had no symptoms of reactive hypoglycemia, diabetic polys, paresthesias or visual blurring.  Last A1c was 6.4% on 09/21/2014.    Further, the patient also has history of Vitamin D Deficiency and supplements vitamin D without any suspected side-effects. Last vitamin D was 32 on 09/21/2014.  Medication Sig  . aspirin 81 MG tablet Take 81 mg by mouth daily.  Marland Kitchen VITAMIN D 1000 UNITS CAPS Take 1 cap daily  . amLODipine  10 MG tablet TAKE 1 TAB DAILY. (TAKE 1/2 - 1 tab )   Allergies  Allergen Reactions  . Ziac [Bisoprolol-Hydrochlorothiazide]     Flushing    PMHx:   Past Medical History  Diagnosis Date  . Hypertension   . Pre-diabetes   . Vitamin D deficiency   . Obesity    Immunization History  Administered Date(s) Administered  . PPD Test 04/26/2013   Past Surgical History  Procedure Laterality Date  . Cesarean section  1971  . Knee arthroscopy Right   . Carpal tunnel release Right   . Tubal ligation     FHx:    Reviewed / unchanged  SHx:    Reviewed / unchanged  Systems Review:  Constitutional: Denies fever, chills, wt changes, headaches, insomnia, fatigue, night sweats, change in appetite. Eyes: Denies redness, blurred vision, diplopia,  discharge, itchy, watery eyes.  ENT: Denies discharge, congestion, post nasal drip, epistaxis, sore throat, earache, hearing loss, dental pain, tinnitus, vertigo, sinus pain, snoring.  CV: Denies chest pain, palpitations, irregular heartbeat, syncope, dyspnea, diaphoresis, orthopnea, PND, claudication or edema. Respiratory: denies cough, dyspnea, DOE, pleurisy, hoarseness, laryngitis, wheezing.  Gastrointestinal: Denies dysphagia, odynophagia, heartburn, reflux, water brash, abdominal pain or cramps, nausea, vomiting, bloating, diarrhea, constipation, hematemesis, melena, hematochezia  or hemorrhoids. Genitourinary: Denies dysuria, frequency, urgency, nocturia, hesitancy, discharge, hematuria or flank pain. Musculoskeletal: Denies arthralgias, myalgias, stiffness, jt. swelling, pain, limping or strain/sprain.  Skin: Denies pruritus, rash, hives, warts, acne, eczema or change in skin lesion(s). Neuro: No weakness, tremor, incoordination, spasms, paresthesia or pain. Psychiatric: Denies confusion, memory loss or sensory loss. Endo: Denies change in weight, skin or hair change.  Heme/Lymph: No excessive bleeding, bruising or enlarged lymph nodes.  Physical Exam  BP 130/88 mmHg  Pulse 84  Temp(Src) 97.3 F (36.3 C)  Resp 16  Ht 5' (1.524 m)  Wt 205 lb (92.987 kg)  BMI 40.04 kg/m2  Appears well nourished and in no distress. Eyes: PERRLA, EOMs, conjunctiva no swelling or erythema. Sinuses: No frontal/maxillary tenderness ENT/Mouth: EAC's clear, TM's nl w/o erythema, bulging. Nares clear w/o erythema, swelling, exudates. Oropharynx clear without erythema or exudates. Oral hygiene is good. Tongue normal, non obstructing. Hearing intact.  Neck: Supple. Thyroid nl. Car 2+/2+ without bruits, nodes or JVD. Chest: Respirations nl with BS clear &  equal w/o rales, rhonchi, wheezing or stridor.  Cor: Heart sounds normal w/ regular rate and rhythm without sig. murmurs, gallops, clicks, or rubs.  Peripheral pulses normal and equal  without edema.  Abdomen: Soft & bowel sounds normal. Non-tender w/o guarding, rebound, hernias, masses, or organomegaly.  Lymphatics: Unremarkable.  Musculoskeletal: Full ROM all peripheral extremities, joint stability, 5/5 strength, and normal gait.  Skin: Warm, dry without exposed rashes, lesions or ecchymosis apparent.  Neuro: Cranial nerves intact, reflexes equal bilaterally. Sensory-motor testing grossly intact. Tendon reflexes grossly intact.  Pysch: Alert & oriented x 3.  Insight and judgement nl & appropriate. No ideations.  Assessment and Plan:  1. Essential hypertension  - TSH  2. Mixed hyperlipidemia  - Lipid panel - TSH  3. Diabetes mellitus without complication (HCC)  - Hemoglobin A1c - Insulin, random  4. Vitamin D deficiency  - VITAMIN D 25 Hydroxy  - encouraged to take 2 caps vit D 5,000 = 10,000 units/daily  5. Obesity   6. Medication management  - CBC with Differential/Platelet - BASIC METABOLIC PANEL WITH GFR - Hepatic function panel - Magnesium   Recommended regular exercise, BP monitoring, weight control, and discussed med and SE's. Recommended labs to assess and monitor clinical status. Further disposition pending results of labs. Over 30 minutes of exam, counseling, chart review was performed

## 2015-04-12 NOTE — Patient Instructions (Signed)

## 2015-04-13 LAB — INSULIN, RANDOM: INSULIN: 15.6 u[IU]/mL (ref 2.0–19.6)

## 2015-04-13 LAB — VITAMIN D 25 HYDROXY (VIT D DEFICIENCY, FRACTURES): VIT D 25 HYDROXY: 29 ng/mL — AB (ref 30–100)

## 2015-05-06 ENCOUNTER — Other Ambulatory Visit: Payer: Self-pay | Admitting: Internal Medicine

## 2015-06-12 ENCOUNTER — Ambulatory Visit (INDEPENDENT_AMBULATORY_CARE_PROVIDER_SITE_OTHER): Payer: Medicare Other | Admitting: Physician Assistant

## 2015-06-12 ENCOUNTER — Encounter: Payer: Self-pay | Admitting: Physician Assistant

## 2015-06-12 VITALS — BP 126/90 | HR 95 | Temp 97.5°F | Resp 16 | Ht 60.0 in | Wt 198.2 lb

## 2015-06-12 DIAGNOSIS — L309 Dermatitis, unspecified: Secondary | ICD-10-CM

## 2015-06-12 MED ORDER — PREDNISONE 20 MG PO TABS: ORAL_TABLET | ORAL | Status: DC

## 2015-06-12 MED ORDER — SULFAMETHOXAZOLE-TRIMETHOPRIM 800-160 MG PO TABS: 1.0000 | ORAL_TABLET | Freq: Two times a day (BID) | ORAL | Status: DC

## 2015-06-12 NOTE — Patient Instructions (Addendum)
Take the magnesium at night try 1 pill rather than 2, but can try to increase to 2 pills Sometimes you need to find different kind of magnesium to try as well.   Call if your leg has any swelling, warmth, tenderness, or redness too it other wise rest, stretch and can ice it.  Get compession stockings  Call if you get a yeast infection  Please take the prednisone to help decrease inflammation and therefore decrease symptoms. Take it it with food to avoid GI upset. It can cause increased energy but on the other hand it can make it hard to sleep at night so please take it AT [place], it takes 8-12 hours to start working so it will NOT affect your sleeping if you take it at night with your food!!  If you are diabetic it will increase your sugars so decrease carbs and monitor your sugars closely.

## 2015-06-12 NOTE — Progress Notes (Signed)
Subjective:    Patient ID: Brittney Gray, female    DOB: February 19, 1950, 65 y.o.   MRN: [id number]  HPI 65 y.o. AAF with history of obesity, HTN, DMII  presents for bites on right arm and pain right leg. She was at her church fish fry Friday, she states she was running in and out of church helping with plates, did not feel any bites however she went home that night and had 2 little itchy bumps on her right arm, woke up that morning and had it worse down her forearm. No rash anywhere else. She has put alcohol on it, benadryl, and calamine lotion on it without help. No fever, chills, etc.   She has had right lateral calf tightness at night x 3 weeks, no pain in back/thigh. Have been walking at the gym for 2-3 weeks. No warmth, swelling, tenderness, or redness. Can just feel it when she points her toes/stretches.   Blood pressure 126/90, pulse 95, temperature 97.5 F (36.4 C), temperature source Temporal, resp. rate 16, height 5' (1.524 m), weight 198 lb 3.2 oz (89.903 kg), SpO2 98 %.  Past Medical History  Diagnosis Date  . Hypertension   . Pre-diabetes   . Vitamin D deficiency   . Obesity    Current Outpatient Prescriptions on File Prior to Visit  Medication Sig Dispense Refill  . amLODipine (NORVASC) 10 MG tablet TAKE 1 TABLET BY MOUTH EVERY DAY (TAKE 1/2 TO 1 TABLET DEPENDING ON BLOOD PRESSURE READING) 90 tablet 1  . aspirin 81 MG tablet Take 81 mg by mouth daily.    . Cholecalciferol (VITAMIN D3) 1000 units CAPS Take 1 cap daily 60 capsule    No current facility-administered medications on file prior to visit.    Review of Systems  Constitutional: Negative.  Negative for fever and chills.  HENT: Negative.   Respiratory: Negative.   Cardiovascular: Negative.   Gastrointestinal: Negative.  Negative for diarrhea.  Genitourinary: Negative.   Musculoskeletal: Positive for myalgias (right leg). Negative for back pain, arthralgias and gait problem.  Skin: Positive for rash. Negative for  color change and wound.  Neurological: Negative.  Negative for dizziness, tremors, weakness and numbness.       Objective:   Physical Exam  Constitutional: She is oriented to person, place, and time. She appears well-developed and well-nourished.  obese  HENT:  Head: Normocephalic and atraumatic.  Right Ear: External ear normal.  Left Ear: External ear normal.  Mouth/Throat: Oropharynx is clear and moist.  Eyes: Conjunctivae and EOM are normal. Pupils are equal, round, and reactive to light.  Neck: Normal range of motion. Neck supple. No thyromegaly present.  Cardiovascular: Normal rate, regular rhythm and normal heart sounds.  Exam reveals no gallop and no friction rub.   No murmur heard. Pulmonary/Chest: Effort normal and breath sounds normal. No respiratory distress. She has no wheezes.  Abdominal: Soft. Bowel sounds are normal. She exhibits no distension and no mass. There is no tenderness. There is no rebound and no guarding.  Musculoskeletal: Normal range of motion.  Nontender right leg with no palpable hard cords, no distal edema, no warmth, tenderness, or erythema.   Lymphadenopathy:    She has no cervical adenopathy.  Neurological: She is alert and oriented to person, place, and time. She displays normal reflexes. No cranial nerve deficit. Coordination normal.  Skin: Skin is warm and dry.  Right ulcar side of arm from elbow to wrist with linear erythematous nodules that are pruritic with some  mild warmth/swelling, no discharge, good distal neurovascular.    Psychiatric: She has a normal mood and affect.       Assessment & Plan:  Allergic dermatitis versus cellulitis right arm Bactrim, prednisone, benadryl  Right leg sprain RICE, stretch before and after exercise if any worsening symptoms come here or go to [facility], low risk DVT and no symptoms at this time.   Future Appointments Date Time Provider [place]  07/18/2015 8:45 AM [name], PA-C [id number] None   10/24/2015 9:00 AM [name], PA-C [id number] None

## 2015-07-18 ENCOUNTER — Ambulatory Visit: Payer: Self-pay | Admitting: Physician Assistant

## 2015-08-10 ENCOUNTER — Ambulatory Visit (INDEPENDENT_AMBULATORY_CARE_PROVIDER_SITE_OTHER): Payer: Medicare Other | Admitting: Physician Assistant

## 2015-08-10 ENCOUNTER — Encounter: Payer: Self-pay | Admitting: Physician Assistant

## 2015-08-10 VITALS — BP 130/80 | HR 91 | Temp 97.5°F | Resp 16 | Ht 60.0 in | Wt 207.2 lb

## 2015-08-10 DIAGNOSIS — E559 Vitamin D deficiency, unspecified: Secondary | ICD-10-CM | POA: Diagnosis not present

## 2015-08-10 DIAGNOSIS — E119 Type 2 diabetes mellitus without complications: Secondary | ICD-10-CM

## 2015-08-10 DIAGNOSIS — I10 Essential (primary) hypertension: Secondary | ICD-10-CM | POA: Diagnosis not present

## 2015-08-10 DIAGNOSIS — R1310 Dysphagia, unspecified: Secondary | ICD-10-CM

## 2015-08-10 DIAGNOSIS — Z0000 Encounter for general adult medical examination without abnormal findings: Secondary | ICD-10-CM

## 2015-08-10 DIAGNOSIS — E782 Mixed hyperlipidemia: Secondary | ICD-10-CM

## 2015-08-10 DIAGNOSIS — Z79899 Other long term (current) drug therapy: Secondary | ICD-10-CM | POA: Diagnosis not present

## 2015-08-10 DIAGNOSIS — D509 Iron deficiency anemia, unspecified: Secondary | ICD-10-CM | POA: Diagnosis not present

## 2015-08-10 LAB — CBC WITH DIFFERENTIAL/PLATELET
BASOS PCT: 0 %
Basophils Absolute: 0 cells/uL (ref 0–200)
EOS PCT: 3 %
Eosinophils Absolute: 216 cells/uL (ref 15–500)
HEMATOCRIT: 42.9 % (ref 35.0–45.0)
HEMOGLOBIN: 14.1 g/dL (ref 11.7–15.5)
LYMPHS ABS: 2448 {cells}/uL (ref 850–3900)
Lymphocytes Relative: 34 %
MCH: 28.5 pg (ref 27.0–33.0)
MCHC: 32.9 g/dL (ref 32.0–36.0)
MCV: 86.8 fL (ref 80.0–100.0)
MONO ABS: 504 {cells}/uL (ref 200–950)
MPV: 10.1 fL (ref 7.5–12.5)
Monocytes Relative: 7 %
NEUTROS ABS: 4032 {cells}/uL (ref 1500–7800)
NEUTROS PCT: 56 %
Platelets: 247 10*3/uL (ref 140–400)
RBC: 4.94 MIL/uL (ref 3.80–5.10)
RDW: 14.3 % (ref 11.0–15.0)
WBC: 7.2 10*3/uL (ref 3.8–10.8)

## 2015-08-10 LAB — HEPATIC FUNCTION PANEL
ALK PHOS: 74 U/L (ref 33–130)
AST: 13 U/L (ref 10–35)
Albumin: 4 g/dL (ref 3.6–5.1)
BILIRUBIN DIRECT: 0.1 mg/dL (ref <=0.2)
BILIRUBIN INDIRECT: 0.3 mg/dL (ref 0.2–1.2)
TOTAL PROTEIN: 6.8 g/dL (ref 6.1–8.1)
Total Bilirubin: 0.4 mg/dL (ref 0.2–1.2)

## 2015-08-10 LAB — BASIC METABOLIC PANEL WITH GFR
BUN: 8 mg/dL (ref 7–25)
CHLORIDE: 102 mmol/L (ref 98–110)
CO2: 27 mmol/L (ref 20–31)
Calcium: 9.4 mg/dL (ref 8.6–10.4)
Creat: 0.78 mg/dL (ref 0.50–0.99)
GFR, EST NON AFRICAN AMERICAN: 80 mL/min (ref >=60)
GFR, Est African American: >89 mL/min (ref >=60)
GLUCOSE: 98 mg/dL (ref 65–99)
POTASSIUM: 4.2 mmol/L (ref 3.5–5.3)
SODIUM: 139 mmol/L (ref 135–146)

## 2015-08-10 LAB — LIPID PANEL
CHOL/HDL RATIO: 2.3 ratio (ref <=5.0)
Cholesterol: 143 mg/dL (ref 125–200)
HDL: 62 mg/dL (ref >=46)
LDL CALC: 72 mg/dL (ref <130)
Triglycerides: 47 mg/dL (ref <150)
VLDL: 9 mg/dL (ref <30)

## 2015-08-10 LAB — MAGNESIUM: Magnesium: 1.8 mg/dL (ref 1.5–2.5)

## 2015-08-10 LAB — TSH: TSH: 2.43 mIU/L

## 2015-08-10 NOTE — Progress Notes (Signed)
MEDICARE WELLNESS VISIT AND 3 MONTH OV  Assessment:   1. Essential hypertension - continue medications, DASH diet, exercise and monitor at home. Call if greater than 130/80.  - CBC with Differential/Platelet - BASIC METABOLIC PANEL WITH GFR - Hepatic function panel - TSH  2. Diabetes mellitus without complication Discussed general issues about diabetes pathophysiology and management., Educational material distributed., Suggested low cholesterol diet., Encouraged aerobic exercise., Discussed foot care., Reminded to get yearly retinal exam. - Hemoglobin A1c - Insulin, fasting - HM DIABETES FOOT EXAM  3. Mixed hyperlipidemia -continue medications, check lipids, decrease fatty foods, increase activity.  - Lipid panel  4. Vitamin D deficiency - Vit D  25 hydroxy (rtn osteoporosis monitoring)  5. Anemia, iron deficiency - monitor, continue iron supp with Vitamin C and increase green leafy veggies - check CBC  6. Morbid Obesity Obesity with co morbidities- long discussion about weight loss, diet, and exercise  7. Medication management - Magnesium  8. Encounter for Medicare annual wellness exam Declines vaccines  9. Dysphagia Likely GERD, will get on zantac x 2-4 weeks, if not better will refer to GI/ENT Weight loss advised, nonsmoker  Over 40 minutes of exam, counseling, chart review and critical decision making was performed  Plan:   During the course of the visit the patient was educated and counseled about appropriate screening and preventive services including:    Pneumococcal vaccine   Prevnar 13  Influenza vaccine  Td vaccine  Screening electrocardiogram  Bone densitometry screening  Colorectal cancer screening  Diabetes screening  Glaucoma screening  Nutrition counseling   Advanced directives: requested   Subjective:  Brittney Gray is a 65 y.o. AA female who presents for wellness visit and 3 month follow up for HTN, chol, DM   She has had  elevated blood pressure for 5-6 years. Her blood pressure has been controlled at home, today their BP is BP: 130/80 She does workout, and she just got silver sneakers, gym 3 x a week most weeks. She denies chest pain, shortness of breath, dizziness.  She is not on cholesterol medication and denies myalgias. Her cholesterol is at goal. The cholesterol last visit was:   Lab Results  Component Value Date   CHOL 159 04/12/2015   HDL 53 04/12/2015   LDLCALC 88 04/12/2015   TRIG 88 04/12/2015   CHOLHDL 3.0 04/12/2015    She has been working on diet and exercise for prediabetes, last visit she was in the DM range, and denies polydipsia and polyuria. Last A1C in the office was:  Lab Results  Component Value Date   HGBA1C 6.5 (H) 04/12/2015   Patient is on Vitamin D supplement, only on 1000 IU daily.   Lab Results  Component Value Date   VD25OH 16 (L) 04/12/2015     She is retired from school system, is divorced, her son is 71 and she has 26 year old granddaughter Has had anemia in the past, had recent EGD and colonoscopy, had polyps removed Lab Results  Component Value Date   IRON 62 09/21/2014   TIBC 315 09/21/2014   FERRITIN 63 09/21/2014   BMI is Body mass index is 40.47 kg/m., she is working on diet and exercise. Wt Readings from Last 3 Encounters:  08/10/15 207 lb 3.2 oz (94 kg)  06/12/15 198 lb 3.2 oz (89.9 kg)  04/12/15 205 lb (93 kg)     Medication Review: Current Outpatient Prescriptions on File Prior to Visit  Medication Sig Dispense Refill  .  amLODipine (NORVASC) 10 MG tablet TAKE 1 TABLET BY MOUTH EVERY DAY (TAKE 1/2 TO 1 TABLET DEPENDING ON BLOOD PRESSURE READING) 90 tablet 1  . aspirin 81 MG tablet Take 81 mg by mouth daily.    . Cholecalciferol (VITAMIN D3) 1000 units CAPS Take 1 cap daily 60 capsule    No current facility-administered medications on file prior to visit.     Current Problems (verified) Patient Active Problem List   Diagnosis Date Noted  .  Medication management 04/12/2015  . Encounter for Medicare annual wellness exam 09/21/2014  . Obesity 09/16/2013  . Anemia, iron deficiency 09/16/2013  . Diabetes mellitus without complication ([facility]) [id number]  . Essential hypertension 01/03/2013  . Mixed hyperlipidemia 01/03/2013  . Vitamin D deficiency 01/03/2013    Screening Tests Immunization History  Administered Date(s) Administered  . PPD Test 04/26/2013    Preventative care: Last colonoscopy: 09/2014 EGD: 09/2014 Dr. Collene Mares Last mammogram: 2016 Last pap smear/pelvic exam: 2016 DEXA: 2013, declines  Prior vaccinations: TD or Tdap: 2009  Influenza: declines Pneumococcal: declines Prevnar13: at age 21 Shingles/Zostavax: declines  Names of Other Physician/Practitioners you currently use: 1. [facility] here for primary care 2. Dr. Truman Hayward, eye doctor, last visit Feb 2016 she is due, wears reading glasses 3. Dr. Evette Doffing, dentist, last visit 2017 Patient Care Team: Unk Pinto, MD as PCP - General (Internal Medicine) Juanita Craver, MD as Consulting Physician (Gastroenterology)  Allergies Allergies  Allergen Reactions  . Ziac [Bisoprolol-Hydrochlorothiazide]     Flushing     SURGICAL HISTORY She  has a past surgical history that includes Cesarean section (1971); Knee arthroscopy (Right); Carpal tunnel release (Right); and Tubal ligation. FAMILY HISTORY Her family history includes Aneurysm in her sister; Diabetes in her brother and brother; Heart disease in her brother and brother; Hypertension in her sister; Stroke in her mother; Thyroid disease in her sister. SOCIAL HISTORY She  reports that she has never smoked. She has never used smokeless tobacco. She reports that she does not drink alcohol or use drugs.  MEDICARE WELLNESS OBJECTIVES: Tobacco use: She does not smoke.  Patient is not a former smoker. If yes, counseling given Alcohol Current alcohol use: none Diet: in  general, a "healthy" diet   Physical activity:   Cardiac risk factors:   Depression/mood screen:   Depression screen [facility] 2/9 04/12/2015  Decreased Interest 0  Down, Depressed, Hopeless 0  PHQ - 2 Score 0    ADLs:  In your present state of health, do you have any difficulty performing the following activities: 04/12/2015 09/21/2014  Hearing? N N  Vision? N Y  Difficulty concentrating or making decisions? N N  Walking or climbing stairs? N N  Dressing or bathing? N N  Doing errands, shopping? - Scientist, forensic and eating ? - N  Using the Toilet? - N  In the past six months, have you accidently leaked urine? - N  Do you have problems with loss of bowel control? - N  Managing your Medications? - N  Managing your Finances? - N  Housekeeping or managing your Housekeeping? - N  Some recent data might be hidden     Cognitive Testing  Alert? Yes  Normal Appearance?Yes  Oriented to person? Yes  Place? Yes   Time? Yes  Recall of three objects?  Yes  Can perform simple calculations? Yes  Displays appropriate judgment?Yes  Can read the correct time from a watch face?Yes  EOL planning: Does patient  have an advance directive?: No Would patient like information on creating an advanced directive?: Yes - Educational materials given  Review of Systems  Constitutional: Negative.   HENT: Negative.   Eyes: Negative.   Respiratory: Negative for cough, hemoptysis, sputum production, shortness of breath and wheezing.   Cardiovascular: Negative.   Gastrointestinal: Positive for heartburn (on prilosec from Dr. Collene Mares). Negative for abdominal pain, blood in stool, constipation, diarrhea, melena, nausea and vomiting.  Genitourinary: Negative.   Musculoskeletal: Positive for back pain. Negative for falls, joint pain, myalgias and neck pain.  Skin: Negative.   Neurological: Negative.   Endo/Heme/Allergies: Negative.   Psychiatric/Behavioral: Negative.      Objective:     Today's Vitals    08/10/15 0853  BP: 130/80  Pulse: 91  Resp: 16  Temp: 97.5 F (36.4 C)  TempSrc: Temporal  SpO2: 95%  Weight: 207 lb 3.2 oz (94 kg)  Height: 5' (1.524 m)  PainSc: 0-No pain   Body mass index is 40.47 kg/m.  General appearance: alert, no distress, WD/WN, female HEENT: normocephalic, sclerae anicteric, TMs pearly, nares patent, no discharge or erythema, pharynx normal Oral cavity: MMM, no lesions Neck: supple, no lymphadenopathy, no thyromegaly, no masses Heart: RRR, normal S1, S2, no murmurs Lungs: CTA bilaterally, no wheezes, rhonchi, or rales Abdomen: +bs, soft, obese, non tender, non distended, no masses, no hepatomegaly, no splenomegaly Musculoskeletal: nontender, no swelling, no obvious deformity Extremities: no edema, no cyanosis, no clubbing Pulses: 2+ symmetric, upper and lower extremities, normal cap refill Neurological: alert, oriented x 3, CN2-12 intact, strength normal upper extremities and lower extremities, sensation normal throughout, DTRs 2+ throughout, no cerebellar signs, gait normal Psychiatric: normal affect, behavior normal, pleasant    Medicare Attestation I have personally reviewed: The patient's medical and social history Their use of alcohol, tobacco or illicit drugs Their current medications and supplements The patient's functional ability including ADLs,fall risks, home safety risks, cognitive, and hearing and visual impairment Diet and physical activities Evidence for depression or mood disorders  The patient's weight, height, BMI, and visual acuity have been recorded in the chart.  I have made referrals, counseling, and provided education to the patient based on review of the above and I have provided the patient with a written personalized care plan for preventive services.     [name], PA-C   08/10/2015

## 2015-08-10 NOTE — Patient Instructions (Addendum)
Get on zantac or generic 150mg  once at night for 2-4 weeks, if throat is not better we will refer you to Dr. Lucia Gaskins.   We are starting you on Metformin to prevent or treat diabetes. Metformin does not cause low blood sugars. In order to create energy your cells need insulin and sugar but sometime your cells do not accept the insulin and this can cause increased sugars and decreased energy. The Metformin helps your cells accept insulin and the sugar to give you more energy.   The two most common side effects are nausea and diarrhea, follow these rules to avoid it! You can take imodium per box instructions when starting metformin if needed.   Rules of metformin: 1) start out slow with only one pill daily. Our goal for you is 4 pills a day or 2000mg  total.  2) take with your largest meal. 3) Take with least amount of carbs.   Call if you have any problems.      Bad carbs also include fruit juice, alcohol, and sweet tea. These are empty calories that do not signal to your brain that you are full.   Please remember the good carbs are still carbs which convert into sugar. So please measure them out no more than 1/2-1 cup of rice, oatmeal, pasta, and beans  Veggies are however free foods! Pile them on.   Not all fruit is created equal. Please see the list below, the fruit at the bottom is higher in sugars than the fruit at the top. Please avoid all dried fruits.    Diabetes is a very complicated disease...lets simplify it.  An easy way to look at it to understand the complications is if you think of the extra sugar floating in your blood stream as glass shards floating through your blood stream.    Diabetes affects your small vessels first: 1) The glass shards (sugar) scraps down the tiny blood vessels in your eyes and lead to diabetic retinopathy, the leading cause of blindness in the [place]. Diabetes is the leading cause of newly diagnosed adult (67 to 66 years of age) blindness in the [place].  2) The glass shards scratches down the tiny vessels of your legs leading to nerve damage called neuropathy and can lead to amputations of your feet. More than 60% of all non-traumatic amputations of lower limbs occur in people with diabetes.  3) Over time the small vessels in your brain are shredded and closed off, individually this does not cause any problems but over a long period of time many of the small vessels being blocked can lead to Vascular Dementia.   4) Your kidney's are a filter system and have a "net" that keeps certain things in the body and lets bad things out. Sugar shreds this net and leads to kidney damage and eventually failure. Decreasing the sugar that is destroying the net and certain blood pressure medications can help stop or decrease progression of kidney disease. Diabetes was the primary cause of kidney failure in 44 percent of all new cases in 2011.  5) Diabetes also destroys the small vessels in your penis that lead to erectile dysfunction. Eventually the vessels are so damaged that you may not be responsive to cialis or viagra.   Diabetes and your large vessels: Your larger vessels consist of your coronary arteries in your heart and the carotid vessels to your brain. Diabetes or even increased sugars put you at 300% increased risk of heart attack and  stroke and this is why.. The sugar scrapes down your large blood vessels and your body sees this as an internal injury and tries to repair itself. Just like you get a scab on your skin, your platelets will stick to the blood vessel wall trying to heal it. This is why we have diabetics on low dose aspirin daily, this prevents the platelets from sticking and can prevent plaque formation. In addition, your body takes cholesterol and tries to shove it into the open wound. This is why we want your LDL, or bad cholesterol, below 70.   The combination of platelets and cholesterol over 5-10 years forms plaque that can break  off and cause a heart attack or stroke.   PLEASE REMEMBER:  Diabetes is preventable! Up to 91 percent of complications and morbidities among individuals with type 2 diabetes can be prevented, delayed, or effectively treated and minimized with regular visits to a health professional, appropriate monitoring and medication, and a healthy diet and lifestyle.

## 2015-08-11 LAB — HEMOGLOBIN A1C
HEMOGLOBIN A1C: 6.1 % — AB (ref <5.7)
Mean Plasma Glucose: 128 mg/dL

## 2015-08-11 LAB — VITAMIN D 25 HYDROXY (VIT D DEFICIENCY, FRACTURES): VIT D 25 HYDROXY: 30 ng/mL (ref 30–100)

## 2015-08-28 ENCOUNTER — Ambulatory Visit (INDEPENDENT_AMBULATORY_CARE_PROVIDER_SITE_OTHER): Payer: Medicare Other | Admitting: Internal Medicine

## 2015-08-28 VITALS — BP 136/88 | HR 94 | Temp 98.0°F | Resp 16 | Ht 60.0 in

## 2015-08-28 DIAGNOSIS — S39012A Strain of muscle, fascia and tendon of lower back, initial encounter: Secondary | ICD-10-CM | POA: Diagnosis not present

## 2015-08-28 MED ORDER — PREDNISONE 20 MG PO TABS: ORAL_TABLET | ORAL | 0 refills | Status: DC

## 2015-08-28 MED ORDER — CYCLOBENZAPRINE HCL 10 MG PO TABS: 10.0000 mg | ORAL_TABLET | Freq: Three times a day (TID) | ORAL | 0 refills | Status: DC | PRN

## 2015-08-28 NOTE — Patient Instructions (Signed)
Low Back Sprain With Rehab A sprain is an injury in which a ligament is torn. The ligaments of the lower back are vulnerable to sprains. However, they are strong and require great force to be injured. These ligaments are important for stabilizing the spinal column. Sprains are classified into three categories. Grade 1 sprains cause pain, but the tendon is not lengthened. Grade 2 sprains include a lengthened ligament, due to the ligament being stretched or partially ruptured. With grade 2 sprains there is still function, although the function may be decreased. Grade 3 sprains involve a complete tear of the tendon or muscle, and function is usually impaired. SYMPTOMS   Severe pain in the lower back.  Sometimes, a feeling of a "pop," "snap," or tear, at the time of injury.  Tenderness and sometimes swelling at the injury site.  Uncommonly, bruising (contusion) within 48 hours of injury.  Muscle spasms in the back. CAUSES  Low back sprains occur when a force is placed on the ligaments that is greater than they can handle. Common causes of injury include:  Performing a stressful act while off-balance.  Repetitive stressful activities that involve movement of the lower back.  Direct hit (trauma) to the lower back. RISK INCREASES WITH:  Contact sports (football, wrestling).  Collisions (major skiing accidents).  Sports that require throwing or lifting (baseball, weightlifting).  Sports involving twisting of the spine (gymnastics, diving, tennis, golf).  Poor strength and flexibility.  Inadequate protection.  Previous back injury or surgery (especially fusion). PREVENTION  Wear properly fitted and padded protective equipment.  Warm up and stretch properly before activity.  Allow for adequate recovery between workouts.  Maintain physical fitness:  Strength, flexibility, and endurance.  Cardiovascular fitness.  Maintain a healthy body weight. PROGNOSIS  If treated properly,  low back sprains usually heal with non-surgical treatment. The length of time for healing depends on the severity of the injury.  RELATED COMPLICATIONS   Recurring symptoms, resulting in a chronic problem.  Chronic inflammation and pain in the low back.  Delayed healing or resolution of symptoms, especially if activity is resumed too soon.  Prolonged impairment.  Unstable or arthritic joints of the low back. TREATMENT  Treatment first involves the use of ice and medicine, to reduce pain and inflammation. The use of strengthening and stretching exercises may help reduce pain with activity. These exercises may be performed at home or with a therapist. Severe injuries may require referral to a therapist for further evaluation and treatment, such as ultrasound. Your caregiver may advise that you wear a back brace or corset, to help reduce pain and discomfort. Often, prolonged bed rest results in greater harm then benefit. Corticosteroid injections may be recommended. However, these should be reserved for the most serious cases. It is important to avoid using your back when lifting objects. At night, sleep on your back on a firm mattress, with a pillow placed under your knees. If non-surgical treatment is unsuccessful, surgery may be needed.  MEDICATION   If pain medicine is needed, nonsteroidal anti-inflammatory medicines (aspirin and ibuprofen), or other minor pain relievers (acetaminophen), are often advised.  Do not take pain medicine for 7 days before surgery.  Prescription pain relievers may be given, if your caregiver thinks they are needed. Use only as directed and only as much as you need.  Ointments applied to the skin may be helpful.  Corticosteroid injections may be given by your caregiver. These injections should be reserved for the most serious cases, because   they may only be given a certain number of times. HEAT AND COLD  Cold treatment (icing) should be applied for 10 to 15  minutes every 2 to 3 hours for inflammation and pain, and immediately after activity that aggravates your symptoms. Use ice packs or an ice massage.  Heat treatment may be used before performing stretching and strengthening activities prescribed by your caregiver, physical therapist, or athletic trainer. Use a heat pack or a warm water soak. SEEK MEDICAL CARE IF:   Symptoms get worse or do not improve in 2 to 4 weeks, despite treatment.  You develop numbness or weakness in either leg.  You lose bowel or bladder function.  Any of the following occur after surgery: fever, increased pain, swelling, redness, drainage of fluids, or bleeding in the affected area.  New, unexplained symptoms develop. (Drugs used in treatment may produce side effects.) EXERCISES  RANGE OF MOTION (ROM) AND STRETCHING EXERCISES - Low Back Sprain Most people with lower back pain will find that their symptoms get worse with excessive bending forward (flexion) or arching at the lower back (extension). The exercises that will help resolve your symptoms will focus on the opposite motion.  Your physician, physical therapist or athletic trainer will help you determine which exercises will be most helpful to resolve your lower back pain. Do not complete any exercises without first consulting with your caregiver. Discontinue any exercises which make your symptoms worse, until you speak to your caregiver. If you have pain, numbness or tingling which travels down into your buttocks, leg or foot, the goal of the therapy is for these symptoms to move closer to your back and eventually resolve. Sometimes, these leg symptoms will get better, but your lower back pain may worsen. This is often an indication of progress in your rehabilitation. Be very alert to any changes in your symptoms and the activities in which you participated in the 24 hours prior to the change. Sharing this information with your caregiver will allow him or her to most  efficiently treat your condition. These exercises may help you when beginning to rehabilitate your injury. Your symptoms may resolve with or without further involvement from your physician, physical therapist or athletic trainer. While completing these exercises, remember:   Restoring tissue flexibility helps normal motion to return to the joints. This allows healthier, less painful movement and activity.  An effective stretch should be held for at least 30 seconds.  A stretch should never be painful. You should only feel a gentle lengthening or release in the stretched tissue. FLEXION RANGE OF MOTION AND STRETCHING EXERCISES: STRETCH - Flexion, Single Knee to Chest   Lie on a firm bed or floor with both legs extended in front of you.  Keeping one leg in contact with the floor, bring your opposite knee to your chest. Hold your leg in place by either grabbing behind your thigh or at your knee.  Pull until you feel a gentle stretch in your low back. Hold __________ seconds.  Slowly release your grasp and repeat the exercise with the opposite side. Repeat __________ times. Complete this exercise __________ times per day.  STRETCH - Flexion, Double Knee to Chest  Lie on a firm bed or floor with both legs extended in front of you.  Keeping one leg in contact with the floor, bring your opposite knee to your chest.  Tense your stomach muscles to support your back and then lift your other knee to your chest. Hold your legs in   place by either grabbing behind your thighs or at your knees.  Pull both knees toward your chest until you feel a gentle stretch in your low back. Hold __________ seconds.  Tense your stomach muscles and slowly return one leg at a time to the floor. Repeat __________ times. Complete this exercise __________ times per day.  STRETCH - Low Trunk Rotation  Lie on a firm bed or floor. Keeping your legs in front of you, bend your knees so they are both pointed toward the  ceiling and your feet are flat on the floor.  Extend your arms out to the side. This will stabilize your upper body by keeping your shoulders in contact with the floor.  Gently and slowly drop both knees together to one side until you feel a gentle stretch in your low back. Hold for __________ seconds.  Tense your stomach muscles to support your lower back as you bring your knees back to the starting position. Repeat the exercise to the other side. Repeat __________ times. Complete this exercise __________ times per day  EXTENSION RANGE OF MOTION AND FLEXIBILITY EXERCISES: STRETCH - Extension, Prone on Elbows   Lie on your stomach on the floor, a bed will be too soft. Place your palms about shoulder width apart and at the height of your head.  Place your elbows under your shoulders. If this is too painful, stack pillows under your chest.  Allow your body to relax so that your hips drop lower and make contact more completely with the floor.  Hold this position for __________ seconds.  Slowly return to lying flat on the floor. Repeat __________ times. Complete this exercise __________ times per day.  RANGE OF MOTION - Extension, Prone Press Ups  Lie on your stomach on the floor, a bed will be too soft. Place your palms about shoulder width apart and at the height of your head.  Keeping your back as relaxed as possible, slowly straighten your elbows while keeping your hips on the floor. You may adjust the placement of your hands to maximize your comfort. As you gain motion, your hands will come more underneath your shoulders.  Hold this position __________ seconds.  Slowly return to lying flat on the floor. Repeat __________ times. Complete this exercise __________ times per day.  RANGE OF MOTION- Quadruped, Neutral Spine   Assume a hands and knees position on a firm surface. Keep your hands under your shoulders and your knees under your hips. You may place padding under your knees for  comfort.  Drop your head and point your tailbone toward the ground below you. This will round out your lower back like an angry cat. Hold this position for __________ seconds.  Slowly lift your head and release your tail bone so that your back sags into a large arch, like an old horse.  Hold this position for __________ seconds.  Repeat this until you feel limber in your low back.  Now, find your "sweet spot." This will be the most comfortable position somewhere between the two previous positions. This is your neutral spine. Once you have found this position, tense your stomach muscles to support your low back.  Hold this position for __________ seconds. Repeat __________ times. Complete this exercise __________ times per day.  STRENGTHENING EXERCISES - Low Back Sprain These exercises may help you when beginning to rehabilitate your injury. These exercises should be done near your "sweet spot." This is the neutral, low-back arch, somewhere between fully rounded and   fully arched, that is your least painful position. When performed in this safe range of motion, these exercises can be used for people who have either a flexion or extension based injury. These exercises may resolve your symptoms with or without further involvement from your physician, physical therapist or athletic trainer. While completing these exercises, remember:   Muscles can gain both the endurance and the strength needed for everyday activities through controlled exercises.  Complete these exercises as instructed by your physician, physical therapist or athletic trainer. Increase the resistance and repetitions only as guided.  You may experience muscle soreness or fatigue, but the pain or discomfort you are trying to eliminate should never worsen during these exercises. If this pain does worsen, stop and make certain you are following the directions exactly. If the pain is still present after adjustments, discontinue the  exercise until you can discuss the trouble with your caregiver. STRENGTHENING - Deep Abdominals, Pelvic Tilt   Lie on a firm bed or floor. Keeping your legs in front of you, bend your knees so they are both pointed toward the ceiling and your feet are flat on the floor.  Tense your lower abdominal muscles to press your low back into the floor. This motion will rotate your pelvis so that your tail bone is scooping upwards rather than pointing at your feet or into the floor. With a gentle tension and even breathing, hold this position for __________ seconds. Repeat __________ times. Complete this exercise __________ times per day.  STRENGTHENING - Abdominals, Crunches   Lie on a firm bed or floor. Keeping your legs in front of you, bend your knees so they are both pointed toward the ceiling and your feet are flat on the floor. Cross your arms over your chest.  Slightly tip your chin down without bending your neck.  Tense your abdominals and slowly lift your trunk high enough to just clear your shoulder blades. Lifting higher can put excessive stress on the lower back and does not further strengthen your abdominal muscles.  Control your return to the starting position. Repeat __________ times. Complete this exercise __________ times per day.  STRENGTHENING - Quadruped, Opposite UE/LE Lift   Assume a hands and knees position on a firm surface. Keep your hands under your shoulders and your knees under your hips. You may place padding under your knees for comfort.  Find your neutral spine and gently tense your abdominal muscles so that you can maintain this position. Your shoulders and hips should form a rectangle that is parallel with the floor and is not twisted.  Keeping your trunk steady, lift your right hand no higher than your shoulder and then your left leg no higher than your hip. Make sure you are not holding your breath. Hold this position for __________ seconds.  Continuing to keep  your abdominal muscles tense and your back steady, slowly return to your starting position. Repeat with the opposite arm and leg. Repeat __________ times. Complete this exercise __________ times per day.  STRENGTHENING - Abdominals and Quadriceps, Straight Leg Raise   Lie on a firm bed or floor with both legs extended in front of you.  Keeping one leg in contact with the floor, bend the other knee so that your foot can rest flat on the floor.  Find your neutral spine, and tense your abdominal muscles to maintain your spinal position throughout the exercise.  Slowly lift your straight leg off the floor about 6 inches for a count of   15, making sure to not hold your breath.  Still keeping your neutral spine, slowly lower your leg all the way to the floor. Repeat this exercise with each leg __________ times. Complete this exercise __________ times per day. POSTURE AND BODY MECHANICS CONSIDERATIONS - Low Back Sprain Keeping correct posture when sitting, standing or completing your activities will reduce the stress put on different body tissues, allowing injured tissues a chance to heal and limiting painful experiences. The following are general guidelines for improved posture. Your physician or physical therapist will provide you with any instructions specific to your needs. While reading these guidelines, remember:  The exercises prescribed by your provider will help you have the flexibility and strength to maintain correct postures.  The correct posture provides the best environment for your joints to work. All of your joints have less wear and tear when properly supported by a spine with good posture. This means you will experience a healthier, less painful body.  Correct posture must be practiced with all of your activities, especially prolonged sitting and standing. Correct posture is as important when doing repetitive low-stress activities (typing) as it is when doing a single heavy-load  activity (lifting). RESTING POSITIONS Consider which positions are most painful for you when choosing a resting position. If you have pain with flexion-based activities (sitting, bending, stooping, squatting), choose a position that allows you to rest in a less flexed posture. You would want to avoid curling into a fetal position on your side. If your pain worsens with extension-based activities (prolonged standing, working overhead), avoid resting in an extended position such as sleeping on your stomach. Most people will find more comfort when they rest with their spine in a more neutral position, neither too rounded nor too arched. Lying on a non-sagging bed on your side with a pillow between your knees, or on your back with a pillow under your knees will often provide some relief. Keep in mind, being in any one position for a prolonged period of time, no matter how correct your posture, can still lead to stiffness. PROPER SITTING POSTURE In order to minimize stress and discomfort on your spine, you must sit with correct posture. Sitting with good posture should be effortless for a healthy body. Returning to good posture is a gradual process. Many people can work toward this most comfortably by using various supports until they have the flexibility and strength to maintain this posture on their own. When sitting with proper posture, your ears will fall over your shoulders and your shoulders will fall over your hips. You should use the back of the chair to support your upper back. Your lower back will be in a neutral position, just slightly arched. You may place a small pillow or folded towel at the base of your lower back for  support.  When working at a desk, create an environment that supports good, upright posture. Without extra support, muscles tire, which leads to excessive strain on joints and other tissues. Keep these recommendations in mind: CHAIR:  A chair should be able to slide under your desk  when your back makes contact with the back of the chair. This allows you to work closely.  The chair's height should allow your eyes to be level with the upper part of your monitor and your hands to be slightly lower than your elbows. BODY POSITION  Your feet should make contact with the floor. If this is not possible, use a foot rest.  Keep your ears   over your shoulders. This will reduce stress on your neck and low back. INCORRECT SITTING POSTURES  If you are feeling tired and unable to assume a healthy sitting posture, do not slouch or slump. This puts excessive strain on your back tissues, causing more damage and pain. Healthier options include:  Using more support, like a lumbar pillow.  Switching tasks to something that requires you to be upright or walking.  Talking a brief walk.  Lying down to rest in a neutral-spine position. PROLONGED STANDING WHILE SLIGHTLY LEANING FORWARD  When completing a task that requires you to lean forward while standing in one place for a long time, place either foot up on a stationary 2-4 inch high object to help maintain the best posture. When both feet are on the ground, the lower back tends to lose its slight inward curve. If this curve flattens (or becomes too large), then the back and your other joints will experience too much stress, tire more quickly, and can cause pain. CORRECT STANDING POSTURES Proper standing posture should be assumed with all daily activities, even if they only take a few moments, like when brushing your teeth. As in sitting, your ears should fall over your shoulders and your shoulders should fall over your hips. You should keep a slight tension in your abdominal muscles to brace your spine. Your tailbone should point down to the ground, not behind your body, resulting in an over-extended swayback posture.  INCORRECT STANDING POSTURES  Common incorrect standing postures include a forward head, locked knees and/or an excessive  swayback. WALKING Walk with an upright posture. Your ears, shoulders and hips should all line-up. PROLONGED ACTIVITY IN A FLEXED POSITION When completing a task that requires you to bend forward at your waist or lean over a low surface, try to find a way to stabilize 3 out of 4 of your limbs. You can place a hand or elbow on your thigh or rest a knee on the surface you are reaching across. This will provide you more stability, so that your muscles do not tire as quickly. By keeping your knees relaxed, or slightly bent, you will also reduce stress across your lower back. CORRECT LIFTING TECHNIQUES DO :  Assume a wide stance. This will provide you more stability and the opportunity to get as close as possible to the object which you are lifting.  Tense your abdominals to brace your spine. Bend at the knees and hips. Keeping your back locked in a neutral-spine position, lift using your leg muscles. Lift with your legs, keeping your back straight.  Test the weight of unknown objects before attempting to lift them.  Try to keep your elbows locked down at your sides in order get the best strength from your shoulders when carrying an object.  Always ask for help when lifting heavy or awkward objects. INCORRECT LIFTING TECHNIQUES DO NOT:   Lock your knees when lifting, even if it is a small object.  Bend and twist. Pivot at your feet or move your feet when needing to change directions.  Assume that you can safely pick up even a paperclip without proper posture.   This information is not intended to replace advice given to you by your health care provider. Make sure you discuss any questions you have with your health care provider.   Document Released: 12/24/2004 Document Revised: 01/14/2014 Document Reviewed: 04/07/2008 [employer] Patient Education 2016 [employer]  

## 2015-08-28 NOTE — Progress Notes (Signed)
   Subjective:    Patient ID: Brittney Gray, female    DOB: [birth year], 65 y.o.   MRN: [id number]  HPI Patient presents to the office for evaluation of back pain x 4 days.  She reports that the pain is constant and does have some catching sensations.  No injury she can recall.  Aleve does tend to help with it a little bit.  She reports that she used to have trouble with her back every now and then.  She does not have radiation of the pain.  The pain is more right sided lumbar.  No urinary symptoms.  No loss of bowel or bladder.  NO tingling or numbness.  She reports that she has been using a heating pad.  This helps temporarily.  She reports that her pain is about an 8/10 with movement.  She reports that as soon as she moves this gets worse.    Review of Systems  Respiratory: Negative for chest tightness and shortness of breath.   Cardiovascular: Negative for chest pain and palpitations.  Gastrointestinal: Negative for abdominal pain, constipation, diarrhea, nausea and vomiting.  Genitourinary: Negative for dysuria, frequency, hematuria and urgency.  Musculoskeletal: Positive for back pain. Negative for gait problem.  Neurological: Negative for dizziness, weakness, numbness and headaches.       Objective:   Physical Exam  Constitutional: She is oriented to person, place, and time. She appears well-developed and well-nourished. No distress.  HENT:  Head: Normocephalic.  Mouth/Throat: Oropharynx is clear and moist. No oropharyngeal exudate.  Eyes: Conjunctivae are normal. No scleral icterus.  Neck: Normal range of motion. Neck supple. No JVD present. No thyromegaly present.  Cardiovascular: Normal rate, regular rhythm, normal heart sounds and intact distal pulses.  Exam reveals no gallop and no friction rub.   No murmur heard. Pulmonary/Chest: Effort normal and breath sounds normal. No respiratory distress. She has no wheezes. She has no rales. She exhibits no tenderness.  Abdominal: Soft.  Bowel sounds are normal. She exhibits no distension and no mass. There is no tenderness. There is no rebound and no guarding.  Musculoskeletal:  Patient rises slowly from sitting to standing.  They walk without an antalgic gait.  There is no evidence of erythema, ecchymosis, or gross deformity.  There is tenderness to palpation over right sided SI joint and right sided lumbar paraspinal muscles.  Active ROM is full in all fields other then forward flexion.  Sensation to light touch is intact over all extremities.  Strength is symmetric and equal in all extremities.       Lymphadenopathy:    She has no cervical adenopathy.  Neurological: She is alert and oriented to person, place, and time. No cranial nerve deficit. Coordination normal.  Skin: Skin is warm and dry. She is not diaphoretic.  Psychiatric: She has a normal mood and affect. Her behavior is normal. Judgment and thought content normal.  Nursing note and vitals reviewed.  Vitals:   08/28/15 0957  BP: 136/88  Pulse: 94  Resp: 16  Temp: 98 F (36.7 C)          Assessment & Plan:    1. Low back strain, initial encounter -prednisone -flexeril -heating pad -return to ov if worsening pain or cauda equina

## 2015-09-21 ENCOUNTER — Encounter: Payer: Self-pay | Admitting: Physician Assistant

## 2015-10-24 ENCOUNTER — Encounter: Payer: Self-pay | Admitting: Physician Assistant

## 2015-10-25 ENCOUNTER — Encounter: Payer: Self-pay | Admitting: Physician Assistant

## 2015-10-25 ENCOUNTER — Ambulatory Visit (INDEPENDENT_AMBULATORY_CARE_PROVIDER_SITE_OTHER): Payer: Medicare Other | Admitting: Physician Assistant

## 2015-10-25 VITALS — BP 132/68 | HR 97 | Temp 97.7°F | Resp 14 | Ht 60.0 in | Wt 206.0 lb

## 2015-10-25 DIAGNOSIS — M5441 Lumbago with sciatica, right side: Secondary | ICD-10-CM

## 2015-10-25 MED ORDER — CYCLOBENZAPRINE HCL 10 MG PO TABS: 10.0000 mg | ORAL_TABLET | Freq: Three times a day (TID) | ORAL | 3 refills | Status: DC | PRN

## 2015-10-25 MED ORDER — GABAPENTIN 300 MG PO CAPS: ORAL_CAPSULE | ORAL | 2 refills | Status: DC

## 2015-10-25 MED ORDER — MELOXICAM 15 MG PO TABS
ORAL_TABLET | ORAL | 1 refills | Status: DC
Start: 2015-10-25 — End: 2016-02-29

## 2015-10-25 MED ORDER — PREDNISONE 20 MG PO TABS: ORAL_TABLET | ORAL | 0 refills | Status: AC

## 2015-10-25 NOTE — Progress Notes (Signed)
Subjective:    Patient ID: Brittney Gray, female    DOB: 02-17-1950, 65 y.o.   MRN: [id number]  HPI 65 y.o. obese AAF presents with right buttocks pain x 2 weeks. She states she has had right lower buttocks pain with pain that radiates down her posterior leg to her foot with occ foot pain. No weakness in her right leg. She will have inner thigh spasms at night that she will eat mustard for. She has been taking aleve intermittent every 3rd night, was on flexeril in the past.   Blood pressure 132/68, pulse 97, temperature 97.7 F (36.5 C), resp. rate 14, height 5' (1.524 m), weight 206 lb (93.4 kg), SpO2 97 %.  Medications Current Outpatient Prescriptions on File Prior to Visit  Medication Sig  . amLODipine (NORVASC) 10 MG tablet TAKE 1 TABLET BY MOUTH EVERY DAY (TAKE 1/2 TO 1 TABLET DEPENDING ON BLOOD PRESSURE READING)  . aspirin 81 MG tablet Take 81 mg by mouth daily.  . Cholecalciferol (VITAMIN D3) 1000 units CAPS Take 1 cap daily  . Magnesium 250 MG TABS Take by mouth daily.   No current facility-administered medications on file prior to visit.     Problem list She has Essential hypertension; Mixed hyperlipidemia; Vitamin D deficiency; Diabetes mellitus without complication ([place]); Obesity; Anemia, iron deficiency; Encounter for Medicare annual wellness exam; and Medication management on her problem list.  Review of Systems  Constitutional: Negative for chills, fatigue and fever.  Gastrointestinal: Negative for nausea and vomiting.  Genitourinary: Negative for difficulty urinating, dysuria, flank pain, frequency, hematuria and urgency.  Musculoskeletal: Positive for back pain.       Objective:   Physical Exam  Constitutional: She is oriented to person, place, and time. She appears well-developed and well-nourished.  HENT:  Head: Normocephalic and atraumatic.  Eyes: Conjunctivae are normal. Pupils are equal, round, and reactive to light.  Neck: Normal range of motion. Neck  supple.  Cardiovascular: Normal rate and regular rhythm.   Pulmonary/Chest: Effort normal and breath sounds normal.  Abdominal: Soft. Bowel sounds are normal. There is no tenderness.  Musculoskeletal:  Patient is able to ambulate well. Gait is  Antalgic. Straight leg raising with dorsiflexion negative bilaterally for radicular symptoms. Sensory exam in the legs are normal. Knee reflexes are normal Ankle reflexes are normal Strength is normal and symmetric in arms and legs. There is SI tenderness to palpation on the right.  There is paraspinal muscle spasm.  There is not midline tenderness.  ROM of spine with  limited in all spheres due to pain. Full ROM of her right hip without pain.   Lymphadenopathy:    She has no cervical adenopathy.  Neurological: She is alert and oriented to person, place, and time. She has normal reflexes.  Skin: Skin is warm and dry. No rash noted.       Assessment & Plan:  Brittney Gray was seen today for acute visit.  Diagnoses and all orders for this visit:  Acute right-sided low back pain with right-sided sciatica -     meloxicam (MOBIC) 15 MG tablet; Take one daily with food for 2 weeks, can take with tylenol, can not take with aleve, iburpofen, then as needed daily for pain -     predniSONE (DELTASONE) 20 MG tablet; 3 tablets daily with food for 3 days, 2 tabs daily for 3 days, 1 tab a day for 5 days. -     gabapentin (NEURONTIN) 300 MG capsule; 1-2 at dinner for leg  pain -     cyclobenzaprine (FLEXERIL) 10 MG tablet; Take 1 tablet (10 mg total) by mouth 3 (three) times daily as needed for muscle spasms. -     Ambulatory referral to Orthopedic Surgery

## 2015-10-25 NOTE — Patient Instructions (Signed)
Take the prednisone with food at first After you finish you can take the mobic/meloxicam  You can take the flexeril AS needed You can take the gabapentin which is for the pain in your leg 1-3 hours before bed, start 1 pill and can go up to 2 pills   Sciatica With Rehab The sciatic nerve runs from the back down the leg and is responsible for sensation and control of the muscles in the back (posterior) side of the thigh, lower leg, and foot. Sciatica is a condition that is characterized by inflammation of this nerve.  SYMPTOMS   Signs of nerve damage, including numbness and/or weakness along the posterior side of the lower extremity.  Pain in the back of the thigh that may also travel down the leg.  Pain that worsens when sitting for long periods of time.  Occasionally, pain in the back or buttock. CAUSES  Inflammation of the sciatic nerve is the cause of sciatica. The inflammation is due to something irritating the nerve. Common sources of irritation include:  Sitting for long periods of time.  Direct trauma to the nerve.  Arthritis of the spine.  Herniated or ruptured disk.  Slipping of the vertebrae (spondylolisthesis).  Pressure from soft tissues, such as muscles or ligament-like tissue (fascia). RISK INCREASES WITH:  Sports that place pressure or stress on the spine (football or weightlifting).  Poor strength and flexibility.  Failure to warm up properly before activity.  Family history of low back pain or disk disorders.  Previous back injury or surgery.  Poor body mechanics, especially when lifting, or poor posture. PREVENTION   Warm up and stretch properly before activity.  Maintain physical fitness:  Strength, flexibility, and endurance.  Cardiovascular fitness.  Learn and use proper technique, especially with posture and lifting. When possible, have coach correct improper technique.  Avoid activities that place stress on the spine. PROGNOSIS If  treated properly, then sciatica usually resolves within 6 weeks. However, occasionally surgery is necessary.  RELATED COMPLICATIONS   Permanent nerve damage, including pain, numbness, tingle, or weakness.  Chronic back pain.  Risks of surgery: infection, bleeding, nerve damage, or damage to surrounding tissues. TREATMENT Treatment initially involves resting from any activities that aggravate your symptoms. The use of ice and medication may help reduce pain and inflammation. The use of strengthening and stretching exercises may help reduce pain with activity. These exercises may be performed at home or with referral to a therapist. A therapist may recommend further treatments, such as transcutaneous electronic nerve stimulation (TENS) or ultrasound. Your caregiver may recommend corticosteroid injections to help reduce inflammation of the sciatic nerve. If symptoms persist despite non-surgical (conservative) treatment, then surgery may be recommended. MEDICATION  If pain medication is necessary, then nonsteroidal anti-inflammatory medications, such as aspirin and ibuprofen, or other minor pain relievers, such as acetaminophen, are often recommended.  Do not take pain medication for 7 days before surgery.  Prescription pain relievers may be given if deemed necessary by your caregiver. Use only as directed and only as much as you need.  Ointments applied to the skin may be helpful.  Corticosteroid injections may be given by your caregiver. These injections should be reserved for the most serious cases, because they may only be given a certain number of times. HEAT AND COLD  Cold treatment (icing) relieves pain and reduces inflammation. Cold treatment should be applied for 10 to 15 minutes every 2 to 3 hours for inflammation and pain and immediately after any activity  that aggravates your symptoms. Use ice packs or massage the area with a piece of ice (ice massage).  Heat treatment may be used  prior to performing the stretching and strengthening activities prescribed by your caregiver, physical therapist, or athletic trainer. Use a heat pack or soak the injury in warm water. SEEK MEDICAL CARE IF:  Treatment seems to offer no benefit, or the condition worsens.  Any medications produce adverse side effects. EXERCISES  RANGE OF MOTION (ROM) AND STRETCHING EXERCISES - Sciatica Most people with sciatic will find that their symptoms worsen with either excessive bending forward (flexion) or arching at the low back (extension). The exercises which will help resolve your symptoms will focus on the opposite motion. Your physician, physical therapist or athletic trainer will help you determine which exercises will be most helpful to resolve your low back pain. Do not complete any exercises without first consulting with your clinician. Discontinue any exercises which worsen your symptoms until you speak to your clinician. If you have pain, numbness or tingling which travels down into your buttocks, leg or foot, the goal of the therapy is for these symptoms to move closer to your back and eventually resolve. Occasionally, these leg symptoms will get better, but your low back pain may worsen; this is typically an indication of progress in your rehabilitation. Be certain to be very alert to any changes in your symptoms and the activities in which you participated in the 24 hours prior to the change. Sharing this information with your clinician will allow him/her to most efficiently treat your condition. These exercises may help you when beginning to rehabilitate your injury. Your symptoms may resolve with or without further involvement from your physician, physical therapist or athletic trainer. While completing these exercises, remember:   Restoring tissue flexibility helps normal motion to return to the joints. This allows healthier, less painful movement and activity.  An effective stretch should be held  for at least 30 seconds.  A stretch should never be painful. You should only feel a gentle lengthening or release in the stretched tissue. FLEXION RANGE OF MOTION AND STRETCHING EXERCISES: STRETCH - Flexion, Single Knee to Chest   Lie on a firm bed or floor with both legs extended in front of you.  Keeping one leg in contact with the floor, bring your opposite knee to your chest. Hold your leg in place by either grabbing behind your thigh or at your knee.  Pull until you feel a gentle stretch in your low back. Hold __________ seconds.  Slowly release your grasp and repeat the exercise with the opposite side. Repeat __________ times. Complete this exercise __________ times per day.  STRETCH - Flexion, Double Knee to Chest  Lie on a firm bed or floor with both legs extended in front of you.  Keeping one leg in contact with the floor, bring your opposite knee to your chest.  Tense your stomach muscles to support your back and then lift your other knee to your chest. Hold your legs in place by either grabbing behind your thighs or at your knees.  Pull both knees toward your chest until you feel a gentle stretch in your low back. Hold __________ seconds.  Tense your stomach muscles and slowly return one leg at a time to the floor. Repeat __________ times. Complete this exercise __________ times per day.  STRETCH - Low Trunk Rotation   Lie on a firm bed or floor. Keeping your legs in front of you, bend your  knees so they are both pointed toward the ceiling and your feet are flat on the floor.  Extend your arms out to the side. This will stabilize your upper body by keeping your shoulders in contact with the floor.  Gently and slowly drop both knees together to one side until you feel a gentle stretch in your low back. Hold for __________ seconds.  Tense your stomach muscles to support your low back as you bring your knees back to the starting position. Repeat the exercise to the other  side. Repeat __________ times. Complete this exercise __________ times per day  EXTENSION RANGE OF MOTION AND FLEXIBILITY EXERCISES: STRETCH - Extension, Prone on Elbows  Lie on your stomach on the floor, a bed will be too soft. Place your palms about shoulder width apart and at the height of your head.  Place your elbows under your shoulders. If this is too painful, stack pillows under your chest.  Allow your body to relax so that your hips drop lower and make contact more completely with the floor.  Hold this position for __________ seconds.  Slowly return to lying flat on the floor. Repeat __________ times. Complete this exercise __________ times per day.  RANGE OF MOTION - Extension, Prone Press Ups  Lie on your stomach on the floor, a bed will be too soft. Place your palms about shoulder width apart and at the height of your head.  Keeping your back as relaxed as possible, slowly straighten your elbows while keeping your hips on the floor. You may adjust the placement of your hands to maximize your comfort. As you gain motion, your hands will come more underneath your shoulders.  Hold this position __________ seconds.  Slowly return to lying flat on the floor. Repeat __________ times. Complete this exercise __________ times per day.  STRENGTHENING EXERCISES - Sciatica  These exercises may help you when beginning to rehabilitate your injury. These exercises should be done near your "sweet spot." This is the neutral, low-back arch, somewhere between fully rounded and fully arched, that is your least painful position. When performed in this safe range of motion, these exercises can be used for people who have either a flexion or extension based injury. These exercises may resolve your symptoms with or without further involvement from your physician, physical therapist or athletic trainer. While completing these exercises, remember:   Muscles can gain both the endurance and the strength  needed for everyday activities through controlled exercises.  Complete these exercises as instructed by your physician, physical therapist or athletic trainer. Progress with the resistance and repetition exercises only as your caregiver advises.  You may experience muscle soreness or fatigue, but the pain or discomfort you are trying to eliminate should never worsen during these exercises. If this pain does worsen, stop and make certain you are following the directions exactly. If the pain is still present after adjustments, discontinue the exercise until you can discuss the trouble with your clinician. STRENGTHENING - Deep Abdominals, Pelvic Tilt   Lie on a firm bed or floor. Keeping your legs in front of you, bend your knees so they are both pointed toward the ceiling and your feet are flat on the floor.  Tense your lower abdominal muscles to press your low back into the floor. This motion will rotate your pelvis so that your tail bone is scooping upwards rather than pointing at your feet or into the floor.  With a gentle tension and even breathing, hold this position  for __________ seconds. Repeat __________ times. Complete this exercise __________ times per day.  STRENGTHENING - Abdominals, Crunches   Lie on a firm bed or floor. Keeping your legs in front of you, bend your knees so they are both pointed toward the ceiling and your feet are flat on the floor. Cross your arms over your chest.  Slightly tip your chin down without bending your neck.  Tense your abdominals and slowly lift your trunk high enough to just clear your shoulder blades. Lifting higher can put excessive stress on the low back and does not further strengthen your abdominal muscles.  Control your return to the starting position. Repeat __________ times. Complete this exercise __________ times per day.  STRENGTHENING - Quadruped, Opposite UE/LE Lift  Assume a hands and knees position on a firm surface. Keep your hands  under your shoulders and your knees under your hips. You may place padding under your knees for comfort.  Find your neutral spine and gently tense your abdominal muscles so that you can maintain this position. Your shoulders and hips should form a rectangle that is parallel with the floor and is not twisted.  Keeping your trunk steady, lift your right hand no higher than your shoulder and then your left leg no higher than your hip. Make sure you are not holding your breath. Hold this position __________ seconds.  Continuing to keep your abdominal muscles tense and your back steady, slowly return to your starting position. Repeat with the opposite arm and leg. Repeat __________ times. Complete this exercise __________ times per day.  STRENGTHENING - Abdominals and Quadriceps, Straight Leg Raise   Lie on a firm bed or floor with both legs extended in front of you.  Keeping one leg in contact with the floor, bend the other knee so that your foot can rest flat on the floor.  Find your neutral spine, and tense your abdominal muscles to maintain your spinal position throughout the exercise.  Slowly lift your straight leg off the floor about 6 inches for a count of 15, making sure to not hold your breath.  Still keeping your neutral spine, slowly lower your leg all the way to the floor. Repeat this exercise with each leg __________ times. Complete this exercise __________ times per day. POSTURE AND BODY MECHANICS CONSIDERATIONS - Sciatica Keeping correct posture when sitting, standing or completing your activities will reduce the stress put on different body tissues, allowing injured tissues a chance to heal and limiting painful experiences. The following are general guidelines for improved posture. Your physician or physical therapist will provide you with any instructions specific to your needs. While reading these guidelines, remember:  The exercises prescribed by your provider will help you have  the flexibility and strength to maintain correct postures.  The correct posture provides the optimal environment for your joints to work. All of your joints have less wear and tear when properly supported by a spine with good posture. This means you will experience a healthier, less painful body.  Correct posture must be practiced with all of your activities, especially prolonged sitting and standing. Correct posture is as important when doing repetitive low-stress activities (typing) as it is when doing a single heavy-load activity (lifting). RESTING POSITIONS Consider which positions are most painful for you when choosing a resting position. If you have pain with flexion-based activities (sitting, bending, stooping, squatting), choose a position that allows you to rest in a less flexed posture. You would want to avoid curling  into a fetal position on your side. If your pain worsens with extension-based activities (prolonged standing, working overhead), avoid resting in an extended position such as sleeping on your stomach. Most people will find more comfort when they rest with their spine in a more neutral position, neither too rounded nor too arched. Lying on a non-sagging bed on your side with a pillow between your knees, or on your back with a pillow under your knees will often provide some relief. Keep in mind, being in any one position for a prolonged period of time, no matter how correct your posture, can still lead to stiffness. PROPER SITTING POSTURE In order to minimize stress and discomfort on your spine, you must sit with correct posture Sitting with good posture should be effortless for a healthy body. Returning to good posture is a gradual process. Many people can work toward this most comfortably by using various supports until they have the flexibility and strength to maintain this posture on their own. When sitting with proper posture, your ears will fall over your shoulders and your  shoulders will fall over your hips. You should use the back of the chair to support your upper back. Your low back will be in a neutral position, just slightly arched. You may place a small pillow or folded towel at the base of your low back for support.  When working at a desk, create an environment that supports good, upright posture. Without extra support, muscles fatigue and lead to excessive strain on joints and other tissues. Keep these recommendations in mind: CHAIR:   A chair should be able to slide under your desk when your back makes contact with the back of the chair. This allows you to work closely.  The chair's height should allow your eyes to be level with the upper part of your monitor and your hands to be slightly lower than your elbows. BODY POSITION  Your feet should make contact with the floor. If this is not possible, use a foot rest.  Keep your ears over your shoulders. This will reduce stress on your neck and low back. INCORRECT SITTING POSTURES   If you are feeling tired and unable to assume a healthy sitting posture, do not slouch or slump. This puts excessive strain on your back tissues, causing more damage and pain. Healthier options include:  Using more support, like a lumbar pillow.  Switching tasks to something that requires you to be upright or walking.  Talking a brief walk.  Lying down to rest in a neutral-spine position. PROLONGED STANDING WHILE SLIGHTLY LEANING FORWARD  When completing a task that requires you to lean forward while standing in one place for a long time, place either foot up on a stationary 2-4 inch high object to help maintain the best posture. When both feet are on the ground, the low back tends to lose its slight inward curve. If this curve flattens (or becomes too large), then the back and your other joints will experience too much stress, fatigue more quickly and can cause pain.  CORRECT STANDING POSTURES Proper standing posture should  be assumed with all daily activities, even if they only take a few moments, like when brushing your teeth. As in sitting, your ears should fall over your shoulders and your shoulders should fall over your hips. You should keep a slight tension in your abdominal muscles to brace your spine. Your tailbone should point down to the ground, not behind your body, resulting in an  over-extended swayback posture.  INCORRECT STANDING POSTURES  Common incorrect standing postures include a forward head, locked knees and/or an excessive swayback. WALKING Walk with an upright posture. Your ears, shoulders and hips should all line-up. PROLONGED ACTIVITY IN A FLEXED POSITION When completing a task that requires you to bend forward at your waist or lean over a low surface, try to find a way to stabilize 3 of 4 of your limbs. You can place a hand or elbow on your thigh or rest a knee on the surface you are reaching across. This will provide you more stability so that your muscles do not fatigue as quickly. By keeping your knees relaxed, or slightly bent, you will also reduce stress across your low back. CORRECT LIFTING TECHNIQUES DO :   Assume a wide stance. This will provide you more stability and the opportunity to get as close as possible to the object which you are lifting.  Tense your abdominals to brace your spine; then bend at the knees and hips. Keeping your back locked in a neutral-spine position, lift using your leg muscles. Lift with your legs, keeping your back straight.  Test the weight of unknown objects before attempting to lift them.  Try to keep your elbows locked down at your sides in order get the best strength from your shoulders when carrying an object.  Always ask for help when lifting heavy or awkward objects. INCORRECT LIFTING TECHNIQUES DO NOT:   Lock your knees when lifting, even if it is a small object.  Bend and twist. Pivot at your feet or move your feet when needing to change  directions.  Assume that you cannot safely pick up a paperclip without proper posture.   This information is not intended to replace advice given to you by your health care provider. Make sure you discuss any questions you have with your health care provider.   Document Released: 12/24/2004 Document Revised: 05/10/2014 Document Reviewed: 04/07/2008 [employer] Interactive Patient Education [employer].

## 2015-11-14 ENCOUNTER — Ambulatory Visit (INDEPENDENT_AMBULATORY_CARE_PROVIDER_SITE_OTHER): Payer: Medicare Other | Admitting: Physician Assistant

## 2015-11-14 ENCOUNTER — Encounter: Payer: Self-pay | Admitting: Physician Assistant

## 2015-11-14 VITALS — BP 130/74 | HR 83 | Temp 97.7°F | Resp 16 | Ht 61.0 in | Wt 207.0 lb

## 2015-11-14 DIAGNOSIS — Z79899 Other long term (current) drug therapy: Secondary | ICD-10-CM | POA: Diagnosis not present

## 2015-11-14 DIAGNOSIS — E119 Type 2 diabetes mellitus without complications: Secondary | ICD-10-CM | POA: Diagnosis not present

## 2015-11-14 DIAGNOSIS — E559 Vitamin D deficiency, unspecified: Secondary | ICD-10-CM | POA: Diagnosis not present

## 2015-11-14 DIAGNOSIS — E782 Mixed hyperlipidemia: Secondary | ICD-10-CM

## 2015-11-14 DIAGNOSIS — I10 Essential (primary) hypertension: Secondary | ICD-10-CM | POA: Diagnosis not present

## 2015-11-14 DIAGNOSIS — D509 Iron deficiency anemia, unspecified: Secondary | ICD-10-CM

## 2015-11-14 DIAGNOSIS — Z136 Encounter for screening for cardiovascular disorders: Secondary | ICD-10-CM

## 2015-11-14 DIAGNOSIS — R6889 Other general symptoms and signs: Secondary | ICD-10-CM | POA: Diagnosis not present

## 2015-11-14 DIAGNOSIS — Z0001 Encounter for general adult medical examination with abnormal findings: Secondary | ICD-10-CM | POA: Diagnosis not present

## 2015-11-14 DIAGNOSIS — Z0000 Encounter for general adult medical examination without abnormal findings: Secondary | ICD-10-CM

## 2015-11-14 LAB — CBC WITH DIFFERENTIAL/PLATELET
BASOS ABS: 0 {cells}/uL (ref 0–200)
Basophils Relative: 0 %
EOS ABS: 222 {cells}/uL (ref 15–500)
Eosinophils Relative: 3 %
HEMATOCRIT: 42.4 % (ref 35.0–45.0)
HEMOGLOBIN: 14.1 g/dL (ref 11.7–15.5)
LYMPHS ABS: 2368 {cells}/uL (ref 850–3900)
Lymphocytes Relative: 32 %
MCH: 28.3 pg (ref 27.0–33.0)
MCHC: 33.3 g/dL (ref 32.0–36.0)
MCV: 85 fL (ref 80.0–100.0)
MPV: 10.5 fL (ref 7.5–12.5)
Monocytes Absolute: 740 cells/uL (ref 200–950)
Monocytes Relative: 10 %
NEUTROS ABS: 4070 {cells}/uL (ref 1500–7800)
NEUTROS PCT: 55 %
Platelets: 286 10*3/uL (ref 140–400)
RBC: 4.99 MIL/uL (ref 3.80–5.10)
RDW: 14.5 % (ref 11.0–15.0)
WBC: 7.4 10*3/uL (ref 3.8–10.8)

## 2015-11-14 LAB — BASIC METABOLIC PANEL WITH GFR
BUN: 9 mg/dL (ref 7–25)
CHLORIDE: 103 mmol/L (ref 98–110)
CO2: 26 mmol/L (ref 20–31)
CREATININE: 0.8 mg/dL (ref 0.50–0.99)
Calcium: 9.6 mg/dL (ref 8.6–10.4)
GFR, Est African American: 89 mL/min (ref >=60)
GFR, Est Non African American: 77 mL/min (ref >=60)
GLUCOSE: 97 mg/dL (ref 65–99)
Potassium: 4.1 mmol/L (ref 3.5–5.3)
SODIUM: 139 mmol/L (ref 135–146)

## 2015-11-14 LAB — HEPATIC FUNCTION PANEL
ALT: 3 U/L — AB (ref 6–29)
AST: 16 U/L (ref 10–35)
Albumin: 4.2 g/dL (ref 3.6–5.1)
Alkaline Phosphatase: 72 U/L (ref 33–130)
BILIRUBIN DIRECT: 0.1 mg/dL (ref <=0.2)
Indirect Bilirubin: 0.5 mg/dL (ref 0.2–1.2)
TOTAL PROTEIN: 7 g/dL (ref 6.1–8.1)
Total Bilirubin: 0.6 mg/dL (ref 0.2–1.2)

## 2015-11-14 LAB — MAGNESIUM: MAGNESIUM: 1.7 mg/dL (ref 1.5–2.5)

## 2015-11-14 LAB — LIPID PANEL
CHOL/HDL RATIO: 2.7 ratio (ref <5.0)
Cholesterol: 148 mg/dL (ref <200)
HDL: 55 mg/dL (ref >50)
LDL Cholesterol: 78 mg/dL
Triglycerides: 74 mg/dL (ref <150)
VLDL: 15 mg/dL (ref <30)

## 2015-11-14 LAB — TSH: TSH: 1.83 m[IU]/L

## 2015-11-14 NOTE — Progress Notes (Signed)
MEDICARE VISIT AND 3 MONTH FOLLOW UP  Assessment:   Essential hypertension - continue medications, DASH diet, exercise and monitor at home. Call if greater than 130/80.  - CBC with Differential/Platelet - BASIC METABOLIC PANEL WITH GFR - Hepatic function panel - TSH - Urinalysis, Routine w reflex microscopic (not at [facility]) - Microalbumin / creatinine urine ratio - EKG 12-Lead   Diabetes mellitus without complication Discussed general issues about diabetes pathophysiology and management., Educational material distributed., Suggested low cholesterol diet., Encouraged aerobic exercise., Discussed foot care., Reminded to get yearly retinal exam. - Hemoglobin A1c - Insulin, fasting - HM DIABETES FOOT EXAM  Mixed hyperlipidemia -continue medications, check lipids, decrease fatty foods, increase activity.  - Lipid panel   Vitamin D deficiency - Vit D  25 hydroxy (rtn osteoporosis monitoring)  Anemia, iron deficiency - monitor, continue iron supp with Vitamin C and increase green leafy veggies   Morbid Obesity Obesity with co morbidities- long discussion about weight loss, diet, and exercise   Medication management - Magnesium  Encounter for Medicare annual wellness exam  Over 40 minutes of exam, counseling, chart review and critical decision making was performed Future Appointments Date Time Provider [place]  02/22/2016 10:30 AM Unk Pinto, MD [facility] None  11/14/2016 9:00 AM [name], PA-C [facility] None     Plan:   During the course of the visit the patient was educated and counseled about appropriate screening and preventive services including:    Pneumococcal vaccine   Prevnar 13  Influenza vaccine  Td vaccine  Screening electrocardiogram  Bone densitometry screening  Colorectal cancer screening  Diabetes screening  Glaucoma screening  Nutrition counseling   Advanced directives: requested   Subjective:  Brittney Gray is  a 65 y.o. AA female who presents for Medicare Annual Wellness Visit and complete physical.     She has had elevated blood pressure since 2011. Her blood pressure has been controlled at home, today their BP is BP: 130/74 She does workout, she is walking when weather permits, and she just got silver sneakers. She denies chest pain, shortness of breath, dizziness.  She is not on cholesterol medication and denies myalgias. Her cholesterol is at goal. The cholesterol last visit was:   Lab Results  Component Value Date   CHOL 143 08/10/2015   HDL 62 08/10/2015   LDLCALC 72 08/10/2015   TRIG 47 08/10/2015   CHOLHDL 2.3 08/10/2015    She has been working on diet and exercise for diabetes without complications, controlled with diet, on bASA, not on ACE, and denies polydipsia and polyuria. Last A1C in the office was:  Lab Results  Component Value Date   HGBA1C 6.1 (H) 08/10/2015   Patient is on Vitamin D supplement, only on 1000 IU daily.   Lab Results  Component Value Date   VD25OH 30 08/10/2015     She is retired from school system, is divorced, her son is 77 and she has 23 year old granddaughter Has had anemia in the past, up to date on EGD and colonoscopy Lab Results  Component Value Date   IRON 62 09/21/2014   TIBC 315 09/21/2014   FERRITIN 63 09/21/2014   She continues to have right buttock/hip pain, has not seen ortho yet, gabapentin is helping with the leg pain.  BMI is Body mass index is 39.11 kg/m., she is working on diet and exercise. Wt Readings from Last 3 Encounters:  11/14/15 207 lb (93.9 kg)  10/25/15 206 lb (93.4 kg)  08/10/15 207 lb 3.2 oz (94 kg)     Medication Review: Current Outpatient Prescriptions on File Prior to Visit  Medication Sig Dispense Refill  . amLODipine (NORVASC) 10 MG tablet TAKE 1 TABLET BY MOUTH EVERY DAY (TAKE 1/2 TO 1 TABLET DEPENDING ON BLOOD PRESSURE READING) 90 tablet 1  . aspirin 81 MG tablet Take 81 mg by mouth daily.    .  Cholecalciferol (VITAMIN D3) 1000 units CAPS Take 1 cap daily 60 capsule   . cyclobenzaprine (FLEXERIL) 10 MG tablet Take 1 tablet (10 mg total) by mouth 3 (three) times daily as needed for muscle spasms. 30 tablet 3  . gabapentin (NEURONTIN) 300 MG capsule 1-2 at dinner for leg pain 90 capsule 2  . Magnesium 250 MG TABS Take by mouth daily.    . meloxicam (MOBIC) 15 MG tablet Take one daily with food for 2 weeks, can take with tylenol, can not take with aleve, iburpofen, then as needed daily for pain 30 tablet 1   No current facility-administered medications on file prior to visit.     Current Problems (verified) Patient Active Problem List   Diagnosis Date Noted  . Medication management 04/12/2015  . Encounter for Medicare annual wellness exam 09/21/2014  . Morbid obesity ([place]) 09/16/2013  . Anemia, iron deficiency 09/16/2013  . Diabetes mellitus without complication ([place]) [id number]  . Essential hypertension 01/03/2013  . Mixed hyperlipidemia 01/03/2013  . Vitamin D deficiency 01/03/2013    Screening Tests Immunization History  Administered Date(s) Administered  . PPD Test 04/26/2013   Preventative care: Last colonoscopy: 09/2014 EGD: 09/2014 Dr. Collene Mares Last mammogram: 05/2014 Last pap smear/pelvic exam: 2016 DEXA: 2013  Prior vaccinations: TD or Tdap: 2009  Influenza: declines Pneumococcal: declines Prevnar13: declines Shingles/Zostavax: declines  Names of Other Physician/Practitioners you currently use: 1. [facility] here for primary care 2. [place], eye doctor, last visit 06/2015 wears reading glasses 3. Dr. Evette Doffing, dentist, last visit 3 months ago Patient Care Team: Unk Pinto, MD as PCP - General (Internal Medicine) Juanita Craver, MD as Consulting Physician (Gastroenterology)  Allergies Allergies  Allergen Reactions  . Ziac [Bisoprolol-Hydrochlorothiazide]     Flushing     SURGICAL HISTORY She  has a past  surgical history that includes Cesarean section (1971); Knee arthroscopy (Right); Carpal tunnel release (Right); and Tubal ligation. FAMILY HISTORY Her family history includes Aneurysm in her sister; Diabetes in her brother and brother; Heart disease in her brother and brother; Hypertension in her sister; Stroke in her mother; Thyroid disease in her sister. SOCIAL HISTORY She  reports that she has never smoked. She has never used smokeless tobacco. She reports that she does not drink alcohol or use drugs.  MEDICARE WELLNESS OBJECTIVES: Tobacco use: She does not smoke.  Patient is not a former smoker. If yes, counseling given Alcohol Current alcohol use: none Osteoporosis: postmenopausal estrogen deficiency and dietary calcium and/or vitamin D deficiency, History of fracture in the past year: no Fall risk: Low Risk Hearing: normal Visual acuity: impaired,  does perform annual eye exam Diet: in general, a "healthy" diet   Physical activity: Current Exercise Habits: The patient does not participate in regular exercise at present Cardiac risk factors: Cardiac Risk Factors include: advanced age (>54men, >43 women);diabetes mellitus;dyslipidemia;hypertension;sedentary lifestyle;obesity (BMI >30kg/m2) Depression/mood screen:   Depression screen [facility] 2/9 11/14/2015  Decreased Interest 0  Down, Depressed, Hopeless 0  PHQ - 2 Score 0    ADLs:  In your present state of  health, do you have any difficulty performing the following activities: 11/14/2015 08/10/2015  Hearing? N N  Vision? N N  Difficulty concentrating or making decisions? N N  Walking or climbing stairs? N N  Dressing or bathing? N N  Doing errands, shopping? N N  Some recent data might be hidden     Cognitive Testing  Alert? Yes  Normal Appearance?Yes  Oriented to person? Yes  Place? Yes   Time? Yes  Recall of three objects?  Yes  Can perform simple calculations? Yes  Displays appropriate judgment?Yes  Can read the correct time  from a watch face?Yes  EOL planning: Does patient have an advance directive?: No Would patient like information on creating an advanced directive?: Yes - Educational materials given  Review of Systems  Constitutional: Negative.   HENT: Negative.   Eyes: Negative.   Respiratory: Negative for cough, hemoptysis, sputum production, shortness of breath and wheezing.   Cardiovascular: Negative.   Gastrointestinal: Positive for heartburn (on prilosec from Dr. Collene Mares). Negative for abdominal pain, blood in stool, constipation, diarrhea, melena, nausea and vomiting.  Genitourinary: Negative.   Musculoskeletal: Positive for back pain. Negative for falls, joint pain, myalgias and neck pain.  Skin: Negative.   Neurological: Negative.   Endo/Heme/Allergies: Negative.   Psychiatric/Behavioral: Negative.      Objective:     Today's Vitals   11/14/15 0850  BP: 130/74  Pulse: 83  Resp: 16  Temp: 97.7 F (36.5 C)  SpO2: 97%  Weight: 207 lb (93.9 kg)  Height: 5\' 1"  (1.549 m)   Body mass index is 39.11 kg/m.  General appearance: alert, no distress, WD/WN, female HEENT: normocephalic, sclerae anicteric, TMs pearly, nares patent, no discharge or erythema, pharynx normal Oral cavity: MMM, no lesions Neck: supple, no lymphadenopathy, no thyromegaly, no masses Heart: RRR, normal S1, S2, no murmurs Lungs: CTA bilaterally, no wheezes, rhonchi, or rales Abdomen: +bs, soft, obese, non tender, non distended, no masses, no hepatomegaly, no splenomegaly Musculoskeletal: nontender, no swelling, no obvious deformity Extremities: no edema, no cyanosis, no clubbing Pulses: 2+ symmetric, upper and lower extremities, normal cap refill Neurological: alert, oriented x 3, CN2-12 intact, strength normal upper extremities and lower extremities, sensation normal throughout, DTRs 2+ throughout, no cerebellar signs, gait normal Psychiatric: normal affect, behavior normal, pleasant  Breasts: breasts appear normal,  no suspicious masses, no skin or nipple changes or axillary nodes.   Medicare Attestation I have personally reviewed: The patient's medical and social history Their use of alcohol, tobacco or illicit drugs Their current medications and supplements The patient's functional ability including ADLs,fall risks, home safety risks, cognitive, and hearing and visual impairment Diet and physical activities Evidence for depression or mood disorders  The patient's weight, height, BMI, and visual acuity have been recorded in the chart.  I have made referrals, counseling, and provided education to the patient based on review of the above and I have provided the patient with a written personalized care plan for preventive services.     [name], PA-C   11/14/2015

## 2015-11-14 NOTE — Patient Instructions (Addendum)
Schedule [facility]  7 a.m.-6:30 p.m., Monday 7 a.m.-5 p.m., Tuesday-Friday Schedule an appointment by calling [phone].  [facility] Schedule an appointment by calling [phone].     Remember exercise is great for your cardiovascular health and can help with weight loss but YOU CAN NOT OUT RUN YOUR FORK!      Pneumococcal Vaccine, Polyvalent suspension for injection What is this medicine? PNEUMOCOCCAL VACCINE (NEU mo KOK al vak SEEN) is a vaccine used to prevent pneumococcus bacterial infections. These bacteria can cause serious infections like pneumonia, meningitis, and blood infections. This vaccine will lower your chance of getting pneumonia. If you do get pneumonia, it can make your symptoms milder and your illness shorter. This vaccine will not treat an infection and will not cause infection. This vaccine is recommended for infants and young children, adults with certain medical conditions, and adults 65 years or older. This medicine may be used for other purposes; ask your health care provider or pharmacist if you have questions. What should I tell my health care provider before I take this medicine? They need to know if you have any of these conditions: -bleeding problems -fever -immune system problems -an unusual or allergic reaction to pneumococcal vaccine, diphtheria toxoid, other vaccines, latex, other medicines, foods, dyes, or preservatives -pregnant or trying to get pregnant -breast-feeding How should I use this medicine? This vaccine is for injection into a muscle. It is given by a health care professional. A copy of Vaccine Information Statements will be given before each vaccination. Read this sheet carefully each time. The sheet may change frequently. Talk to your pediatrician regarding the use of this medicine in children. While this drug may be prescribed for children as young as 31 weeks old for selected conditions,  precautions do apply. Overdosage: If you think you have taken too much of this medicine contact a poison control center or emergency room at once. NOTE: This medicine is only for you. Do not share this medicine with others. What if I miss a dose? It is important not to miss your dose. Call your doctor or health care professional if you are unable to keep an appointment. What may interact with this medicine? -medicines for cancer chemotherapy -medicines that suppress your immune function -steroid medicines like prednisone or cortisone This list may not describe all possible interactions. Give your health care provider a list of all the medicines, herbs, non-prescription drugs, or dietary supplements you use. Also tell them if you smoke, drink alcohol, or use illegal drugs. Some items may interact with your medicine. What should I watch for while using this medicine? Mild fever and pain should go away in 3 days or less. Report any unusual symptoms to your doctor or health care professional. What side effects may I notice from receiving this medicine? Side effects that you should report to your doctor or health care professional as soon as possible: -allergic reactions like skin rash, itching or hives, swelling of the face, lips, or tongue -breathing problems -confused -fast or irregular heartbeat -fever over 102 degrees F -seizures -unusual bleeding or bruising -unusual muscle weakness Side effects that usually do not require medical attention (report to your doctor or health care professional if they continue or are bothersome): -aches and pains -diarrhea -fever of 102 degrees F or less -headache -irritable -loss of appetite -pain, tender at site where injected -trouble sleeping This list may not describe all possible side effects. Call your doctor for medical  advice about side effects. You may report side effects to FDA at 1-800-FDA-1088. Where should I keep my medicine? This does  not apply. This vaccine is given in a clinic, pharmacy, doctor's office, or other health care setting and will not be stored at home. NOTE: This sheet is a summary. It may not cover all possible information. If you have questions about this medicine, talk to your doctor, pharmacist, or health care provider.    2016, Elsevier/Gold Standard. (2013-09-30 10:27:27)

## 2015-11-15 LAB — URINALYSIS, ROUTINE W REFLEX MICROSCOPIC
BILIRUBIN URINE: NEGATIVE
GLUCOSE, UA: NEGATIVE
Ketones, ur: NEGATIVE
Nitrite: POSITIVE — AB
PH: 5.5 (ref 5.0–8.0)
PROTEIN: NEGATIVE
Specific Gravity, Urine: 1.021 (ref 1.001–1.035)

## 2015-11-15 LAB — URINALYSIS, MICROSCOPIC ONLY
Casts: NONE SEEN [LPF]
Crystals: NONE SEEN [HPF]
Yeast: NONE SEEN [HPF]

## 2015-11-15 LAB — MICROALBUMIN / CREATININE URINE RATIO
Creatinine, Urine: 231 mg/dL (ref 20–320)
Microalb Creat Ratio: 6 mcg/mg creat (ref <30)
Microalb, Ur: 1.5 mg/dL

## 2015-11-15 LAB — HEMOGLOBIN A1C
Hgb A1c MFr Bld: 6.1 % — ABNORMAL HIGH (ref <5.7)
MEAN PLASMA GLUCOSE: 128 mg/dL

## 2015-11-16 ENCOUNTER — Other Ambulatory Visit (INDEPENDENT_AMBULATORY_CARE_PROVIDER_SITE_OTHER): Payer: Medicare Other

## 2015-11-16 DIAGNOSIS — R8271 Bacteriuria: Secondary | ICD-10-CM | POA: Diagnosis not present

## 2015-11-19 LAB — URINE CULTURE

## 2015-11-20 ENCOUNTER — Other Ambulatory Visit: Payer: Self-pay | Admitting: Physician Assistant

## 2015-11-20 MED ORDER — SULFAMETHOXAZOLE-TRIMETHOPRIM 800-160 MG PO TABS
1.0000 | ORAL_TABLET | Freq: Two times a day (BID) | ORAL | 0 refills | Status: DC
Start: 2015-11-20 — End: 2016-02-29

## 2015-12-21 ENCOUNTER — Ambulatory Visit (INDEPENDENT_AMBULATORY_CARE_PROVIDER_SITE_OTHER): Payer: Medicare Other | Admitting: *Deleted

## 2015-12-21 DIAGNOSIS — N390 Urinary tract infection, site not specified: Secondary | ICD-10-CM

## 2015-12-21 LAB — URINALYSIS, ROUTINE W REFLEX MICROSCOPIC
Bilirubin Urine: NEGATIVE
Glucose, UA: NEGATIVE
Hgb urine dipstick: NEGATIVE
Ketones, ur: NEGATIVE
Nitrite: NEGATIVE
PROTEIN: NEGATIVE
SPECIFIC GRAVITY, URINE: 1.016 (ref 1.001–1.035)
pH: 7 (ref 5.0–8.0)

## 2015-12-21 LAB — URINALYSIS, MICROSCOPIC ONLY
BACTERIA UA: NONE SEEN [HPF]
Casts: NONE SEEN [LPF]
Crystals: NONE SEEN [HPF]
Yeast: NONE SEEN [HPF]

## 2015-12-21 NOTE — Progress Notes (Signed)
Patient presents for 1 month recheck UA, C&S. Patient denies any current dysuria or UTI concerns.

## 2015-12-22 LAB — URINE CULTURE

## 2016-02-22 ENCOUNTER — Ambulatory Visit: Payer: Self-pay | Admitting: Internal Medicine

## 2016-02-26 ENCOUNTER — Other Ambulatory Visit: Payer: Self-pay | Admitting: Internal Medicine

## 2016-02-29 ENCOUNTER — Encounter: Payer: Self-pay | Admitting: Internal Medicine

## 2016-02-29 ENCOUNTER — Ambulatory Visit (INDEPENDENT_AMBULATORY_CARE_PROVIDER_SITE_OTHER): Payer: Medicare Other | Admitting: Internal Medicine

## 2016-02-29 VITALS — BP 136/104 | HR 88 | Temp 97.3°F | Resp 16 | Ht 60.0 in | Wt 203.8 lb

## 2016-02-29 DIAGNOSIS — I10 Essential (primary) hypertension: Secondary | ICD-10-CM

## 2016-02-29 DIAGNOSIS — E782 Mixed hyperlipidemia: Secondary | ICD-10-CM

## 2016-02-29 DIAGNOSIS — Z79899 Other long term (current) drug therapy: Secondary | ICD-10-CM | POA: Diagnosis not present

## 2016-02-29 DIAGNOSIS — R609 Edema, unspecified: Secondary | ICD-10-CM | POA: Diagnosis not present

## 2016-02-29 DIAGNOSIS — E119 Type 2 diabetes mellitus without complications: Secondary | ICD-10-CM | POA: Diagnosis not present

## 2016-02-29 DIAGNOSIS — E559 Vitamin D deficiency, unspecified: Secondary | ICD-10-CM

## 2016-02-29 LAB — HEPATIC FUNCTION PANEL
ALBUMIN: 4.2 g/dL (ref 3.6–5.1)
AST: 12 U/L (ref 10–35)
Alkaline Phosphatase: 74 U/L (ref 33–130)
BILIRUBIN DIRECT: 0.1 mg/dL (ref <=0.2)
Indirect Bilirubin: 0.3 mg/dL (ref 0.2–1.2)
Total Bilirubin: 0.4 mg/dL (ref 0.2–1.2)
Total Protein: 7.1 g/dL (ref 6.1–8.1)

## 2016-02-29 LAB — CBC WITH DIFFERENTIAL/PLATELET
BASOS ABS: 0 {cells}/uL (ref 0–200)
Basophils Relative: 0 %
EOS ABS: 216 {cells}/uL (ref 15–500)
Eosinophils Relative: 3 %
HEMATOCRIT: 42.6 % (ref 35.0–45.0)
HEMOGLOBIN: 14.1 g/dL (ref 11.7–15.5)
LYMPHS ABS: 2448 {cells}/uL (ref 850–3900)
Lymphocytes Relative: 34 %
MCH: 27.6 pg (ref 27.0–33.0)
MCHC: 33.1 g/dL (ref 32.0–36.0)
MCV: 83.5 fL (ref 80.0–100.0)
MPV: 10.1 fL (ref 7.5–12.5)
Monocytes Absolute: 648 cells/uL (ref 200–950)
Monocytes Relative: 9 %
NEUTROS ABS: 3888 {cells}/uL (ref 1500–7800)
Neutrophils Relative %: 54 %
Platelets: 276 10*3/uL (ref 140–400)
RBC: 5.1 MIL/uL (ref 3.80–5.10)
RDW: 15 % (ref 11.0–15.0)
WBC: 7.2 10*3/uL (ref 3.8–10.8)

## 2016-02-29 LAB — LIPID PANEL
CHOL/HDL RATIO: 2.6 ratio (ref <5.0)
Cholesterol: 151 mg/dL (ref <200)
HDL: 57 mg/dL (ref >50)
LDL Cholesterol: 77 mg/dL (ref <100)
Triglycerides: 84 mg/dL (ref <150)
VLDL: 17 mg/dL (ref <30)

## 2016-02-29 LAB — BASIC METABOLIC PANEL WITH GFR
BUN: 10 mg/dL (ref 7–25)
CHLORIDE: 104 mmol/L (ref 98–110)
CO2: 26 mmol/L (ref 20–31)
CREATININE: 0.93 mg/dL (ref 0.50–0.99)
Calcium: 9.5 mg/dL (ref 8.6–10.4)
GFR, Est African American: 74 mL/min (ref >=60)
GFR, Est Non African American: 64 mL/min (ref >=60)
Glucose, Bld: 85 mg/dL (ref 65–99)
Potassium: 4.3 mmol/L (ref 3.5–5.3)
Sodium: 140 mmol/L (ref 135–146)

## 2016-02-29 LAB — TSH: TSH: 1.78 m[IU]/L

## 2016-02-29 MED ORDER — HYDROCHLOROTHIAZIDE 25 MG PO TABS: ORAL_TABLET | ORAL | 1 refills | Status: DC

## 2016-02-29 NOTE — Patient Instructions (Signed)

## 2016-02-29 NOTE — Progress Notes (Signed)
[facility] Unk Pinto, M.D.        Uvaldo Bristle. Silverio Lay, P.A.-C       Starlyn Skeans, P.A.-C  [facility]                [address] [place] [id number] Telephone [phone] Telefax [phone] ______________________________________________________________________     This very nice 66 y.o. DBF presents for 3 month follow up with Hypertension, Hyperlipidemia, Pre-Diabetes and Vitamin D Deficiency.      Patient also has c/o intermittent L sciatica and didn't keep previous with Dr Rhona Raider.      Patient is treated for HTN (2002) & BP has been controlled at home. Today's BP is elevated at 136/104. Patient has had no complaints of any cardiac type chest pain, palpitations, dyspnea/orthopnea/PND, dizziness, claudication, or dependent edema.     Hyperlipidemia is controlled with diet & meds. Patient denies myalgias or other med SE's. Last Lipids were at goal: Lab Results  Component Value Date   CHOL 148 11/14/2015   HDL 55 11/14/2015   LDLCALC 78 11/14/2015   TRIG 74 11/14/2015   CHOLHDL 2.7 11/14/2015      Also, the patient has history of Morbid Obesity (BMI 39+) and consequent and consequent T2_NIDDM and she has had no symptoms of reactive hypoglycemia, diabetic polys, paresthesias or visual blurring.  She is not monitoring CBG's. Last A1c was not at goal: Lab Results  Component Value Date   HGBA1C 6.1 (H) 11/14/2015      Further, the patient also has history of Vitamin D Deficiency n("32" in Sept 2016)  and supplements vitamin D without any suspected side-effects. Last vitamin D was still very low:  Lab Results  Component Value Date   VD25OH 30 08/10/2015   Current Outpatient Prescriptions on File Prior to Visit  Medication Sig  . amLODipine  10 MG tablet   TAKE 1/2-1 TAB EVERY DAY  . aspirin 81 MG tablet Takedaily.  Marland Kitchen VITAMIN D 1000 units CAPS Take 1 cap daily  . cyclobenzaprine   10 MG tablet Take 1 tab 3 x daily as needed for muscle spasms.  Marland Kitchen gabapentin  300 MG capsule 1-2 at dinner for leg pain  . Magnesium 250 MG TABS Take by mouth daily.  . meloxicam  15 MG tablet Take one daily    Allergies  Allergen Reactions  . Ziac [Bisoprolol-Hydrochlorothiazide] Flushing   PMHx:   Past Medical History:  Diagnosis Date  . Hypertension   . Obesity   . Pre-diabetes   . Vitamin D deficiency    Immunization History  Administered Date(s) Administered  . PPD Test 04/26/2013   Past Surgical History:  Procedure Laterality Date  . CARPAL TUNNEL RELEASE Right   . CESAREAN SECTION  1971  . KNEE ARTHROSCOPY Right   . TUBAL LIGATION     FHx:    Reviewed / unchanged  SHx:    Reviewed / unchanged  Systems Review:  Constitutional: Denies fever, chills, wt changes, headaches, insomnia, fatigue, night sweats, change in appetite. Eyes: Denies redness, blurred vision, diplopia, discharge, itchy, watery eyes.  ENT: Denies discharge, congestion, post nasal drip, epistaxis, sore throat, earache, hearing loss, dental pain, tinnitus, vertigo, sinus pain, snoring.  CV: Denies chest pain, palpitations, irregular heartbeat, syncope, dyspnea, diaphoresis, orthopnea, PND, claudication or edema. Respiratory: denies cough, dyspnea, DOE, pleurisy, hoarseness, laryngitis, wheezing.  Gastrointestinal:  Denies dysphagia, odynophagia, heartburn, reflux, water brash, abdominal pain or cramps, nausea, vomiting, bloating, diarrhea, constipation, hematemesis, melena, hematochezia  or hemorrhoids. Genitourinary: Denies dysuria, frequency, urgency, nocturia, hesitancy, discharge, hematuria or flank pain. Musculoskeletal: Denies arthralgias, myalgias, stiffness, jt. swelling, pain, limping or strain/sprain.  Skin: Denies pruritus, rash, hives, warts, acne, eczema or change in skin lesion(s). Neuro: No weakness, tremor, incoordination, spasms, paresthesia or pain. Psychiatric: Denies confusion, memory  loss or sensory loss. Endo: Denies change in weight, skin or hair change.  Heme/Lymph: No excessive bleeding, bruising or enlarged lymph nodes.  Physical Exam  BP  136/104   P88   T 97.3 F    R 16   Ht 5'    Wt 203 lb 12.8 oz    BMI 39.80   Appears over nourished and in no distress.  Eyes: PERRLA, EOMs, conjunctiva no swelling or erythema. Sinuses: No frontal/maxillary tenderness ENT/Mouth: EAC's clear, TM's nl w/o erythema, bulging. Nares clear w/o erythema, swelling, exudates. Oropharynx clear without erythema or exudates. Oral hygiene is good. Tongue normal, non obstructing. Hearing intact.  Neck: Supple. Thyroid nl. Car 2+/2+ without bruits, nodes or JVD. Chest: Respirations nl with BS clear & equal w/o rales, rhonchi, wheezing or stridor.  Cor: Heart sounds normal w/ regular rate and rhythm without sig. murmurs, gallops, clicks, or rubs. Peripheral pulses obscured by 1-2 + pretibial and ankle edema with mild stasis pigmentation.  Abdomen: Soft, obese  & bowel sounds normal. Non-tender w/o guarding, rebound, hernias, masses, or organomegaly.  Lymphatics: Unremarkable.  Musculoskeletal: Full ROM all peripheral extremities, joint stability, 5/5 strength, and normal gait.  Skin: Warm, dry without exposed rashes, lesions or ecchymosis apparent.  Neuro: Cranial nerves intact, reflexes equal bilaterally. Sensory-motor testing grossly intact. Tendon reflexes grossly intact.  Pysch: Alert & oriented x 3.  Insight and judgement nl & appropriate. No ideations.  Assessment and Plan:  1. Essential hypertension  - Continue medication, monitor blood pressure at home.  - Continue DASH diet. Reminder to go to the ER if any CP,  SOB, nausea, dizziness, severe HA, changes vision/speech,  left arm numbness and tingling and jaw pain.  - CBC with Differential/Platelet - BASIC METABOLIC PANEL WITH GFR - TSH - hydrochlorothiazide ( 25 MG; Take 1 tablet each morning for BP & Fluid & Swelling   Disp: 90 ; Rf: 1  2. Mixed hyperlipidemia  - Continue diet/meds, exercise,& lifestyle modifications.  - Continue monitor periodic cholesterol/liver & renal functions   - Hepatic function panel - Lipid panel - TSH  3. Diabetes mellitus  ([place])  - Continue diet, exercise, lifestyle modifications.  - Monitor appropriate labs.  - Hemoglobin A1c - Insulin, random  4. Vitamin D deficiency  - Continue supplementation.  - VITAMIN D 25 Hydroxy   5. Medication management  - CBC with Differential/Platelet - BASIC METABOLIC PANEL WITH GFR - Hepatic function panel - Magnesium - Lipid panel - TSH - Hemoglobin A1c - Insulin, random - VITAMIN D 25 Hydroxy   6. Edema  - hydrochlorothiazide  25 MG; Take 1 tablet each morning for BP & Fluid & Swelling  Disp: 90; Rf: 1  7. Lumbago with Left Sciatica-   - encouraged to reschedule her appt with Dr Rhona Raider       Recommended regular exercise, BP monitoring, weight control, and discussed med and SE's. Recommended labs to assess and monitor clinical status. Further disposition pending results of labs. Over 30 minutes of exam, counseling, chart review was performed

## 2016-03-01 LAB — HEMOGLOBIN A1C
HEMOGLOBIN A1C: 6.3 % — AB (ref <5.7)
Mean Plasma Glucose: 134 mg/dL

## 2016-03-01 LAB — INSULIN, RANDOM: Insulin: 9.6 u[IU]/mL (ref 2.0–19.6)

## 2016-03-01 LAB — VITAMIN D 25 HYDROXY (VIT D DEFICIENCY, FRACTURES): VIT D 25 HYDROXY: 31 ng/mL (ref 30–100)

## 2016-03-01 LAB — MAGNESIUM: MAGNESIUM: 1.8 mg/dL (ref 1.5–2.5)

## 2016-05-30 NOTE — Progress Notes (Deleted)
3 MONTH FOLLOW UP  Assessment:   Essential hypertension - continue medications, DASH diet, exercise and monitor at home. Call if greater than 130/80.  - CBC with Differential/Platelet - BASIC METABOLIC PANEL WITH GFR - Hepatic function panel - TSH - Urinalysis, Routine w reflex microscopic (not at [facility]) - Microalbumin / creatinine urine ratio - EKG 12-Lead   Diabetes mellitus without complication Discussed general issues about diabetes pathophysiology and management., Educational material distributed., Suggested low cholesterol diet., Encouraged aerobic exercise., Discussed foot care., Reminded to get yearly retinal exam. - Hemoglobin A1c - Insulin, fasting - HM DIABETES FOOT EXAM  Mixed hyperlipidemia -continue medications, check lipids, decrease fatty foods, increase activity.  - Lipid panel   Morbid Obesity Obesity with co morbidities- long discussion about weight loss, diet, and exercise   Medication management - Magnesium  Over 30 minutes of exam, counseling, chart review and critical decision making was performed Future Appointments Date Time Provider [place]  05/31/2016 8:45 AM [name], PA-C [facility] None  09/06/2016 9:30 AM Unk Pinto, MD [facility] None  12/12/2016 10:00 AM [name], PA-C [facility] None     Subjective:  Brittney Gray is a 66 y.o. AA female who presents for 3 month follow up for HTN, chol and DM.   She has had elevated blood pressure since 2011. Her blood pressure has been controlled at home, today their BP is   She does workout, she is walking when weather permits, and she just got silver sneakers. She denies chest pain, shortness of breath, dizziness.  She is not on cholesterol medication and denies myalgias. Her cholesterol is at goal. The cholesterol last visit was:   Lab Results  Component Value Date   CHOL 151 02/29/2016   HDL 57 02/29/2016   LDLCALC 77 02/29/2016   TRIG 84 02/29/2016   CHOLHDL 2.6  02/29/2016    She has been working on diet and exercise for diabetes without complications, controlled with diet, on bASA, not on ACE, and denies polydipsia and polyuria. Last A1C in the office was:  Lab Results  Component Value Date   HGBA1C 6.3 (H) 02/29/2016   Patient is on Vitamin D supplement, only on 1000 IU daily.   Lab Results  Component Value Date   VD25OH 31 02/29/2016     She is retired from school system, is divorced, her son is 48 and she has 71 year old granddaughter Has had anemia in the past, up to date on EGD and colonoscopy Lab Results  Component Value Date   IRON 62 09/21/2014   TIBC 315 09/21/2014   FERRITIN 63 09/21/2014   She continues to have right buttock/hip pain, has not seen ortho yet, gabapentin is helping with the leg pain.  BMI is There is no height or weight on file to calculate BMI., she is working on diet and exercise. Wt Readings from Last 3 Encounters:  02/29/16 203 lb 12.8 oz (92.4 kg)  11/14/15 207 lb (93.9 kg)  10/25/15 206 lb (93.4 kg)     Medication Review: Current Outpatient Prescriptions on File Prior to Visit  Medication Sig Dispense Refill  . amLODipine (NORVASC) 10 MG tablet TAKE 1 TABLET BY MOUTH EVERY DAY (TAKE 1/2 TO 1 TABLET DEPENDING ON BLOOD PRESSURE READING) 90 tablet 1  . aspirin 81 MG tablet Take 81 mg by mouth daily.    . Cholecalciferol (VITAMIN D3) 1000 units CAPS Take 1 cap daily 60 capsule   . hydrochlorothiazide (HYDRODIURIL) 25 MG tablet Take  1 tablet each morning for BP & Fluid & Swelling 90 tablet 1  . Magnesium 250 MG TABS Take by mouth daily.     No current facility-administered medications on file prior to visit.     Current Problems (verified) Patient Active Problem List   Diagnosis Date Noted  . Medication management 04/12/2015  . Encounter for Medicare annual wellness exam 09/21/2014  . Morbid obesity ([place]) 09/16/2013  . Anemia, iron deficiency 09/16/2013  . Diabetes mellitus without complication  ([place]) 88/41/6606  . Essential hypertension 01/03/2013  . Mixed hyperlipidemia 01/03/2013  . Vitamin D deficiency 01/03/2013    Allergies Allergies  Allergen Reactions  . Ziac [Bisoprolol-Hydrochlorothiazide]     Flushing     SURGICAL HISTORY She  has a past surgical history that includes Cesarean section (1971); Knee arthroscopy (Right); Carpal tunnel release (Right); and Tubal ligation. FAMILY HISTORY Her family history includes Aneurysm in her sister; Diabetes in her brother and brother; Heart disease in her brother and brother; Hypertension in her sister; Stroke in her mother; Thyroid disease in her sister. SOCIAL HISTORY She  reports that she has never smoked. She has never used smokeless tobacco. She reports that she does not drink alcohol or use drugs.   Review of Systems  Constitutional: Negative.   HENT: Negative.   Eyes: Negative.   Respiratory: Negative for cough, hemoptysis, sputum production, shortness of breath and wheezing.   Cardiovascular: Negative.   Gastrointestinal: Positive for heartburn (on prilosec from Dr. Collene Mares). Negative for abdominal pain, blood in stool, constipation, diarrhea, melena, nausea and vomiting.  Genitourinary: Negative.   Musculoskeletal: Positive for back pain. Negative for falls, joint pain, myalgias and neck pain.  Skin: Negative.   Neurological: Negative.   Endo/Heme/Allergies: Negative.   Psychiatric/Behavioral: Negative.      Objective:     There were no vitals filed for this visit. There is no height or weight on file to calculate BMI.  General appearance: alert, no distress, WD/WN, female HEENT: normocephalic, sclerae anicteric, TMs pearly, nares patent, no discharge or erythema, pharynx normal Oral cavity: MMM, no lesions Neck: supple, no lymphadenopathy, no thyromegaly, no masses Heart: RRR, normal S1, S2, no murmurs Lungs: CTA bilaterally, no wheezes, rhonchi, or rales Abdomen: +bs, soft, obese, non tender, non  distended, no masses, no hepatomegaly, no splenomegaly Musculoskeletal: nontender, no swelling, no obvious deformity Extremities: no edema, no cyanosis, no clubbing Pulses: 2+ symmetric, upper and lower extremities, normal cap refill Neurological: alert, oriented x 3, CN2-12 intact, strength normal upper extremities and lower extremities, sensation normal throughout, DTRs 2+ throughout, no cerebellar signs, gait normal Psychiatric: normal affect, behavior normal, pleasant  Breasts: breasts appear normal, no suspicious masses, no skin or nipple changes or axillary nodes.   [name], PA-C   05/30/2016

## 2016-05-31 ENCOUNTER — Ambulatory Visit: Payer: Self-pay | Admitting: Physician Assistant

## 2016-07-10 NOTE — Progress Notes (Signed)
MEDICARE VISIT AND 3 MONTH FOLLOW UP  Assessment:    Essential hypertension - continue medications, DASH diet, exercise and monitor at home. Call if greater than 130/80.  - CBC with Differential/Platelet - BASIC METABOLIC PANEL WITH GFR - Hepatic function panel - TSH   Diabetes mellitus without complication Discussed general issues about diabetes pathophysiology and management., Educational material distributed., Suggested low cholesterol diet., Encouraged aerobic exercise., Discussed foot care., Reminded to get yearly retinal exam. - Hemoglobin A1c - HM DIABETES FOOT EXAM  Mixed hyperlipidemia -continue medications, check lipids, decrease fatty foods, increase activity.  - Lipid panel   Vitamin D deficiency - Vit D  25 hydroxy (rtn osteoporosis monitoring)  Anemia, iron deficiency - monitor, continue iron supp with Vitamin C and increase green leafy veggies   Morbid Obesity Obesity with co morbidities- long discussion about weight loss, diet, and exercise   Medication management - Magnesium  Encounter for Medicare annual wellness exam GET MGM  Morbid Obesity with co morbidities - long discussion about weight loss, diet, and exercise   Continue diet and meds as discussed. Further disposition pending results of labs. Discussed med's effects and SE's.   Over 30 minutes of exam, counseling, chart review, and critical decision making was performed  Plan:   During the course of the visit the patient was educated and counseled about appropriate screening and preventive services including:    Pneumococcal vaccine   Prevnar 13  Influenza vaccine  Td vaccine  Screening electrocardiogram  Bone densitometry screening  Colorectal cancer screening  Diabetes screening  Glaucoma screening  Nutrition counseling   Advanced directives: requested   Subjective:   HPI 66 y.o. female  presents for 3 month follow up on hypertension, cholesterol,  diabetes and vitamin D deficiency and medicare wellness.   Her blood pressure has been controlled at home, today her BP is BP: 128/84.  She does workout, walks sporadically. She denies chest pain, shortness of breath, dizziness.  She is not on cholesterol medication and denies myalgias. Her cholesterol is at goal. The cholesterol was:   Lab Results  Component Value Date   CHOL 151 02/29/2016   HDL 57 02/29/2016   LDLCALC 77 02/29/2016   TRIG 84 02/29/2016   CHOLHDL 2.6 02/29/2016   She has been working on diet and exercise for diabetes without complications, she is not on bASA, she is not on ACE/ARB, and denies  paresthesia of the feet, polydipsia, polyuria and visual disturbances. Last A1C was:  Lab Results  Component Value Date   HGBA1C 6.3 (H) 02/29/2016  Patient is on Vitamin D supplement, 2000 IU daily Lab Results  Component Value Date   VD25OH 31 02/29/2016  BMI is Body mass index is 39.1 kg/m., she is working on diet and exercise. Wt Readings from Last 3 Encounters:  07/12/16 200 lb 3.2 oz (90.8 kg)  02/29/16 203 lb 12.8 oz (92.4 kg)  11/14/15 207 lb (93.9 kg)     Current Medications:  Current Outpatient Prescriptions on File Prior to Visit  Medication Sig Dispense Refill  . amLODipine (NORVASC) 10 MG tablet TAKE 1 TABLET BY MOUTH EVERY DAY (TAKE 1/2 TO 1 TABLET DEPENDING ON BLOOD PRESSURE READING) 90 tablet 1  . aspirin 81 MG tablet Take 81 mg by mouth daily.    . Cholecalciferol (VITAMIN D3) 1000 units CAPS Take 1 cap daily 60 capsule   . hydrochlorothiazide (HYDRODIURIL) 25 MG tablet Take 1 tablet each morning for BP & Fluid & Swelling 90 tablet  1  . Magnesium 250 MG TABS Take by mouth daily.     No current facility-administered medications on file prior to visit.    Medical History:  Past Medical History:  Diagnosis Date  . Hypertension   . Obesity   . Pre-diabetes   . Vitamin D deficiency    Allergies Allergies  Allergen Reactions  . Ziac  [Bisoprolol-Hydrochlorothiazide]     Flushing    Immunization History  Administered Date(s) Administered  . PPD Test 04/26/2013    Preventative care: Last colonoscopy: 09/2014 EGD: 09/2014 Dr. Collene Mares Last mammogram: 05/2014 Last pap smear/pelvic exam: 2016 DEXA: 2013  Prior vaccinations: TD or Tdap: 2009         Influenza: declines Pneumococcal: declines Prevnar13: declines Shingles/Zostavax: declines  Names of Other Physician/Practitioners you currently use: 1. [facility] here for primary care 2. [facility] doctor, last visit 02/2016 wears reading glasses 3. Dr. Evette Doffing, dentist, last visit 06/2016 Patient Care Team: Unk Pinto, MD as PCP - General (Internal Medicine) Juanita Craver, MD as Consulting Physician (Gastroenterology)  SURGICAL HISTORY She  has a past surgical history that includes Cesarean section (1971); Knee arthroscopy (Right); Carpal tunnel release (Right); and Tubal ligation. FAMILY HISTORY Her family history includes Aneurysm in her sister; Diabetes in her brother and brother; Heart disease in her brother and brother; Hypertension in her sister; Stroke in her mother; Thyroid disease in her sister. SOCIAL HISTORY She  reports that she has never smoked. She has never used smokeless tobacco. She reports that she does not drink alcohol or use drugs.  Review of Systems:  Review of Systems  Constitutional: Negative.   HENT: Negative.   Eyes: Negative.   Respiratory: Negative.   Cardiovascular: Negative.   Gastrointestinal: Negative.   Genitourinary: Negative.   Musculoskeletal: Negative.   Skin: Negative.   Neurological: Negative.   Endo/Heme/Allergies: Negative.   Psychiatric/Behavioral: Negative.    Physical Exam: BP 128/84   Pulse 78   Temp (!) 97.5 F (36.4 C)   Resp 14   Ht 5' (1.524 m)   Wt 200 lb 3.2 oz (90.8 kg)   SpO2 96%   BMI 39.10 kg/m  Wt Readings from Last 3 Encounters:  07/12/16 200 lb  3.2 oz (90.8 kg)  02/29/16 203 lb 12.8 oz (92.4 kg)  11/14/15 207 lb (93.9 kg)   General Appearance: Well nourished, in no apparent distress. Eyes: PERRLA, EOMs, conjunctiva no swelling or erythema Sinuses: No Frontal/maxillary tenderness ENT/Mouth: Ext aud canals clear, TMs without erythema, bulging. No erythema, swelling, or exudate on post pharynx.  Tonsils not swollen or erythematous. Hearing normal.  Neck: Supple, thyroid normal.  Respiratory: Respiratory effort normal, BS equal bilaterally without rales, rhonchi, wheezing or stridor.  Cardio: RRR with no MRGs. Brisk peripheral pulses without edema.  Abdomen: Soft, + BS.  Non tender, no guarding, rebound, hernias, masses. Lymphatics: Non tender without lymphadenopathy.  Musculoskeletal: Full ROM, 5/5 strength, Normal gait Skin: Warm, dry without rashes, lesions, ecchymosis.  Neuro: Cranial nerves intact. No cerebellar symptoms.  Psych: Awake and oriented X 3, normal affect, Insight and Judgment appropriate.   Medicare Attestation I have personally reviewed: The patient's medical and social history Their use of alcohol, tobacco or illicit drugs Their current medications and supplements The patient's functional ability including ADLs,fall risks, home safety risks, cognitive, and hearing and visual impairment Diet and physical activities Evidence for depression or mood disorders  The patient's weight, height, BMI, and visual acuity have been  recorded in the chart.  I have made referrals, counseling, and provided education to the patient based on review of the above and I have provided the patient with a written personalized care plan for preventive services.    [name], PA-C 9:42 AM [facility]

## 2016-07-12 ENCOUNTER — Encounter: Payer: Self-pay | Admitting: Physician Assistant

## 2016-07-12 ENCOUNTER — Ambulatory Visit (INDEPENDENT_AMBULATORY_CARE_PROVIDER_SITE_OTHER): Payer: Medicare Other | Admitting: Physician Assistant

## 2016-07-12 VITALS — BP 128/84 | HR 78 | Temp 97.5°F | Resp 14 | Ht 60.0 in | Wt 200.2 lb

## 2016-07-12 DIAGNOSIS — Z0000 Encounter for general adult medical examination without abnormal findings: Secondary | ICD-10-CM

## 2016-07-12 DIAGNOSIS — Z6839 Body mass index (BMI) 39.0-39.9, adult: Secondary | ICD-10-CM | POA: Diagnosis not present

## 2016-07-12 DIAGNOSIS — E119 Type 2 diabetes mellitus without complications: Secondary | ICD-10-CM | POA: Diagnosis not present

## 2016-07-12 DIAGNOSIS — E559 Vitamin D deficiency, unspecified: Secondary | ICD-10-CM | POA: Diagnosis not present

## 2016-07-12 DIAGNOSIS — E782 Mixed hyperlipidemia: Secondary | ICD-10-CM

## 2016-07-12 DIAGNOSIS — Z79899 Other long term (current) drug therapy: Secondary | ICD-10-CM | POA: Diagnosis not present

## 2016-07-12 DIAGNOSIS — R6889 Other general symptoms and signs: Secondary | ICD-10-CM

## 2016-07-12 DIAGNOSIS — I10 Essential (primary) hypertension: Secondary | ICD-10-CM

## 2016-07-12 DIAGNOSIS — D509 Iron deficiency anemia, unspecified: Secondary | ICD-10-CM

## 2016-07-12 DIAGNOSIS — Z0001 Encounter for general adult medical examination with abnormal findings: Secondary | ICD-10-CM

## 2016-07-12 LAB — HEPATIC FUNCTION PANEL
ALT: <3 U/L — ABNORMAL LOW (ref 6–29)
AST: 14 U/L (ref 10–35)
Albumin: 3.9 g/dL (ref 3.6–5.1)
Alkaline Phosphatase: 58 U/L (ref 33–130)
BILIRUBIN DIRECT: 0.1 mg/dL (ref <=0.2)
BILIRUBIN INDIRECT: 0.5 mg/dL (ref 0.2–1.2)
TOTAL PROTEIN: 6.7 g/dL (ref 6.1–8.1)
Total Bilirubin: 0.6 mg/dL (ref 0.2–1.2)

## 2016-07-12 LAB — LIPID PANEL
CHOL/HDL RATIO: 3 ratio (ref <5.0)
CHOLESTEROL: 148 mg/dL (ref <200)
HDL: 50 mg/dL — ABNORMAL LOW (ref >50)
LDL Cholesterol: 83 mg/dL (ref <100)
TRIGLYCERIDES: 77 mg/dL (ref <150)
VLDL: 15 mg/dL (ref <30)

## 2016-07-12 LAB — BASIC METABOLIC PANEL WITH GFR
BUN: 15 mg/dL (ref 7–25)
CALCIUM: 9.4 mg/dL (ref 8.6–10.4)
CO2: 26 mmol/L (ref 20–31)
CREATININE: 0.94 mg/dL (ref 0.50–0.99)
Chloride: 102 mmol/L (ref 98–110)
GFR, EST AFRICAN AMERICAN: 73 mL/min (ref >=60)
GFR, EST NON AFRICAN AMERICAN: 63 mL/min (ref >=60)
Glucose, Bld: 94 mg/dL (ref 65–99)
Potassium: 4.1 mmol/L (ref 3.5–5.3)
SODIUM: 138 mmol/L (ref 135–146)

## 2016-07-12 LAB — CBC WITH DIFFERENTIAL/PLATELET
Basophils Absolute: 0 cells/uL (ref 0–200)
Basophils Relative: 0 %
EOS PCT: 4 %
Eosinophils Absolute: 276 cells/uL (ref 15–500)
HCT: 41.5 % (ref 35.0–45.0)
Hemoglobin: 13.8 g/dL (ref 11.7–15.5)
LYMPHS ABS: 2139 {cells}/uL (ref 850–3900)
LYMPHS PCT: 31 %
MCH: 28.5 pg (ref 27.0–33.0)
MCHC: 33.3 g/dL (ref 32.0–36.0)
MCV: 85.6 fL (ref 80.0–100.0)
MONOS PCT: 8 %
MPV: 10 fL (ref 7.5–12.5)
Monocytes Absolute: 552 cells/uL (ref 200–950)
NEUTROS PCT: 57 %
Neutro Abs: 3933 cells/uL (ref 1500–7800)
PLATELETS: 262 10*3/uL (ref 140–400)
RBC: 4.85 MIL/uL (ref 3.80–5.10)
RDW: 14.4 % (ref 11.0–15.0)
WBC: 6.9 10*3/uL (ref 3.8–10.8)

## 2016-07-12 LAB — TSH: TSH: 1.8 m[IU]/L

## 2016-07-12 NOTE — Patient Instructions (Addendum)
Simple math prevails.    1st - exercise does not produce significant weight loss - at best one converts fat into muscle , "bulks up", loses inches, but usually stays "weight neutral"     2nd - think of your body weightas a check book: If you eat more calories than you burn up - you save money or gain weight .... Or if you spend more money than you put in the check book, ie burn up more calories than you eat, then you lose weight     3rd - if you walk or run 1 mile, you burn up 100 calories - you have to burn up 3,500 calories to lose 1 pound, ie you have to walk/run 35 miles to lose 1 measly pound. So if you want to lose 10 #, then you have to walk/run 350 miles, so.... clearly exercise is not the solution.     4. So if you consume 1,500 calories, then you have to burn up the equivalent of 15 miles to stay weight neutral - It also stands to reason that if you consume 1,500 cal/day and don't lose weight, then you must be burning up about 1,500 cals/day to stay weight neutral.     5. If you really want to lose weight, you must cut your calorie intake 300 calories /day and at that rate you should lose about 1 # every 3 days.   6. Please purchase Dr Fara Olden Fuhrman's book(s) "The End of Dieting" & "Eat to Live" . It has some great concepts and recipes.    [facility]  7 a.m.-6:30 p.m., Monday 7 a.m.-5 p.m., Tuesday-Friday Schedule an appointment by calling [phone].  Get on zantac from over the counter, generic is fine, take once at night for 2-4 weeks   We want weight loss that will last so you should lose 1-2 pounds a week.  THAT IS IT! Please pick THREE things a month to change. Once it is a habit check off the item. Then pick another three items off the list to become habits.  If you are already doing a habit on the list GREAT!  Cross that item off! o Don't drink your calories. Ie, alcohol, soda, fruit juice, and sweet tea.  o Drink more water. Drink a glass  when you feel hungry or before each meal.  o Eat breakfast - Complex carb and protein (likeDannon light and fit yogurt, oatmeal, fruit, eggs, [place] bacon). o Measure your cereal.  Eat no more than one cup a day. (ie [place]) o Eat an apple a day. o Add a vegetable a day. o Try a new vegetable a month. o Use Pam! Stop using oil or butter to cook. o Don't finish your plate or use smaller plates. o Share your dessert. o Eat sugar free Jello for dessert or frozen grapes. o Don't eat 2-3 hours before bed. o Switch to whole wheat bread, pasta, and brown rice. o Make healthier choices when you eat out. No fries! o Pick baked chicken, NOT fried. o Don't forget to SLOW DOWN when you eat. It is not going anywhere.  o Take the stairs. o Park far away in the parking lot o [employer] (or weights) for 10 minutes while watching TV. o Walk at work for 10 minutes during break. o Walk outside 1 time a week with your friend, kids, dog, or significant other. o Start a walking group at [place] the mall as  much as you can tolerate.  o Keep a food diary. o Weigh yourself daily. o Walk for 15 minutes 3 days per week. o Cook at home more often and eat out less.  If life happens and you go back to old habits, it is okay.  Just start over. You can do it!   If you experience chest pain, get short of breath, or tired during the exercise, please stop immediately and inform your doctor.      Bad carbs also include fruit juice, alcohol, and sweet tea. These are empty calories that do not signal to your brain that you are full.   Please remember the good carbs are still carbs which convert into sugar. So please measure them out no more than 1/2-1 cup of rice, oatmeal, pasta, and beans  Veggies are however free foods! Pile them on.   Not all fruit is created equal. Please see the list below, the fruit at the bottom is higher in sugars than the fruit at the top. Please avoid all dried fruits.

## 2016-07-13 LAB — HEMOGLOBIN A1C
Hgb A1c MFr Bld: 6.3 % — ABNORMAL HIGH (ref <5.7)
Mean Plasma Glucose: 134 mg/dL

## 2016-07-17 NOTE — Progress Notes (Signed)
Pt aware of lab results & voiced understanding of those results.

## 2016-08-28 ENCOUNTER — Other Ambulatory Visit: Payer: Self-pay | Admitting: Internal Medicine

## 2016-08-28 DIAGNOSIS — R609 Edema, unspecified: Secondary | ICD-10-CM

## 2016-08-28 DIAGNOSIS — I10 Essential (primary) hypertension: Secondary | ICD-10-CM

## 2016-08-28 LAB — HM DIABETES EYE EXAM

## 2016-08-31 ENCOUNTER — Other Ambulatory Visit: Payer: Self-pay | Admitting: Physician Assistant

## 2016-09-06 ENCOUNTER — Ambulatory Visit: Payer: Self-pay | Admitting: Internal Medicine

## 2016-11-14 ENCOUNTER — Encounter: Payer: Self-pay | Admitting: Physician Assistant

## 2016-12-10 NOTE — Progress Notes (Deleted)
CPE AND 3 MONTH FOLLOW UP  Assessment:    Essential hypertension - continue medications, DASH diet, exercise and monitor at home. Call if greater than 130/80.  - CBC with Differential/Platelet - BASIC METABOLIC PANEL WITH GFR - Hepatic function panel - TSH   Diabetes mellitus without complication Discussed general issues about diabetes pathophysiology and management., Educational material distributed., Suggested low cholesterol diet., Encouraged aerobic exercise., Discussed foot care., Reminded to get yearly retinal exam. - Hemoglobin A1c - HM DIABETES FOOT EXAM  Mixed hyperlipidemia -continue medications, check lipids, decrease fatty foods, increase activity.  - Lipid panel   Vitamin D deficiency - Vit D  25 hydroxy (rtn osteoporosis monitoring)  Anemia, iron deficiency - monitor, continue iron supp with Vitamin C and increase green leafy veggies   Morbid Obesity Obesity with co morbidities- long discussion about weight loss, diet, and exercise   Medication management - Magnesium   Continue diet and meds as discussed. Further disposition pending results of labs. Discussed med's effects and SE's.   Over 40 minutes of exam, counseling, chart review, and critical decision making was performed Future Appointments  Date Time Provider [place]  12/12/2016 10:00 AM [name], PA-C [facility] None  12/24/2017 10:00 AM [name], PA-C [facility] None     Subjective:   HPI 66 y.o. female  presents for 3 month follow up on hypertension, cholesterol, diabetes and vitamin D deficiency and CPE.  Her blood pressure has been controlled at home, today her BP is  .  She does workout, walks sporadically. She denies chest pain, shortness of breath, dizziness.  She is not on cholesterol medication and denies myalgias. Her cholesterol is at goal. The cholesterol was:   Lab Results  Component Value Date   CHOL 148 07/12/2016   HDL 50 (L) 07/12/2016   LDLCALC 83 07/12/2016   TRIG 77 07/12/2016   CHOLHDL 3.0 07/12/2016   She has been working on diet and exercise for diabetes without complications, she is not on bASA, she is not on ACE/ARB, and denies  paresthesia of the feet, polydipsia, polyuria and visual disturbances. Last A1C was:  Lab Results  Component Value Date   HGBA1C 6.3 (H) 07/12/2016  Patient is on Vitamin D supplement, 2000 IU daily Lab Results  Component Value Date   VD25OH 31 02/29/2016  BMI is There is no height or weight on file to calculate BMI., she is working on diet and exercise. Wt Readings from Last 3 Encounters:  07/12/16 200 lb 3.2 oz (90.8 kg)  02/29/16 203 lb 12.8 oz (92.4 kg)  11/14/15 207 lb (93.9 kg)     Current Medications:  Current Outpatient Medications on File Prior to Visit  Medication Sig Dispense Refill  . amLODipine (NORVASC) 10 MG tablet TAKE 1 TABLET BY MOUTH EVERY DAY (TAKE 1/2 TO 1 TABLET DEPENDING ON BLOOD PRESSURE READING) 90 tablet 1  . aspirin 81 MG tablet Take 81 mg by mouth daily.    . Cholecalciferol (VITAMIN D3) 1000 units CAPS Take 1 cap daily 60 capsule   . hydrochlorothiazide (HYDRODIURIL) 25 MG tablet TAKE 1 TABLET EACH MORNING FOR BP & FLUID & SWELLING 90 tablet 1  . Magnesium 250 MG TABS Take by mouth daily.     No current facility-administered medications on file prior to visit.    Medical History:  Past Medical History:  Diagnosis Date  . Hypertension   . Obesity   . Pre-diabetes   . Vitamin D deficiency    Allergies  Allergies  Allergen Reactions  . Ziac [Bisoprolol-Hydrochlorothiazide]     Flushing    Immunization History  Administered Date(s) Administered  . PPD Test 04/26/2013    Preventative care: Last colonoscopy: 09/2014 EGD: 09/2014 Dr. Collene Mares Last mammogram: 05/2014 Last pap smear/pelvic exam: 2016 DEXA: 2013  Prior vaccinations: TD or Tdap: 2009         Influenza: declines Pneumococcal: declines Prevnar13: declines Shingles/Zostavax:  declines  Names of Other Physician/Practitioners you currently use: 1. [facility] here for primary care 2. [facility] doctor, last visit 02/2016 wears reading glasses 3. Dr. Evette Doffing, dentist, last visit 06/2016 Patient Care Team: Unk Pinto, MD as PCP - General (Internal Medicine) Juanita Craver, MD as Consulting Physician (Gastroenterology)  SURGICAL HISTORY She  has a past surgical history that includes Cesarean section (1971); Knee arthroscopy (Right); Carpal tunnel release (Right); and Tubal ligation. FAMILY HISTORY Her family history includes Aneurysm in her sister; Diabetes in her brother and brother; Heart disease in her brother and brother; Hypertension in her sister; Stroke in her mother; Thyroid disease in her sister. SOCIAL HISTORY She  reports that  has never smoked. she has never used smokeless tobacco. She reports that she does not drink alcohol or use drugs.  Review of Systems:  Review of Systems  Constitutional: Negative.   HENT: Negative.   Eyes: Negative.   Respiratory: Negative.   Cardiovascular: Negative.   Gastrointestinal: Negative.   Genitourinary: Negative.   Musculoskeletal: Negative.   Skin: Negative.   Neurological: Negative.   Endo/Heme/Allergies: Negative.   Psychiatric/Behavioral: Negative.    Physical Exam: There were no vitals taken for this visit. Wt Readings from Last 3 Encounters:  07/12/16 200 lb 3.2 oz (90.8 kg)  02/29/16 203 lb 12.8 oz (92.4 kg)  11/14/15 207 lb (93.9 kg)   General Appearance: Well nourished, in no apparent distress. Eyes: PERRLA, EOMs, conjunctiva no swelling or erythema Sinuses: No Frontal/maxillary tenderness ENT/Mouth: Ext aud canals clear, TMs without erythema, bulging. No erythema, swelling, or exudate on post pharynx.  Tonsils not swollen or erythematous. Hearing normal.  Neck: Supple, thyroid normal.  Respiratory: Respiratory effort normal, BS equal bilaterally without  rales, rhonchi, wheezing or stridor.  Cardio: RRR with no MRGs. Brisk peripheral pulses without edema.  Abdomen: Soft, + BS.  Non tender, no guarding, rebound, hernias, masses. Lymphatics: Non tender without lymphadenopathy.  Musculoskeletal: Full ROM, 5/5 strength, Normal gait Skin: Warm, dry without rashes, lesions, ecchymosis.  Neuro: Cranial nerves intact. No cerebellar symptoms.  Psych: Awake and oriented X 3, normal affect, Insight and Judgment appropriate.    [name], PA-C 1:45 PM [facility]

## 2016-12-12 ENCOUNTER — Encounter: Payer: Self-pay | Admitting: Physician Assistant

## 2016-12-21 NOTE — Progress Notes (Signed)
Complete Physical  Assessment and Plan:  Diagnoses and all orders for this visit:  Encounter for routine adult physical exam with abnormal findings  Essential hypertension Currently well controlled; continue medications Monitor blood pressure at home; call if consistently over 130/80 Continue DASH diet.   Reminder to go to the ER if any CP, SOB, nausea, dizziness, severe HA, changes vision/speech, left arm numbness and tingling and jaw pain. -     Magnesium  Diabetes mellitus with stage 2 CKD ([name]) Currently well controlled by lifestyle modification and slow weight loss with A1Cs in prediabetic range Continue diet and exercise.  Perform daily foot/skin check, notify office of any concerning changes Annual foot exam was performed at last visit; reminded to get annual diabetic eye exam -     Hemoglobin A1c -     Microalbumin / creatinine urine ratio  Mixed hyperlipidemia Currently well managed by lifestyle modification; continue to encourage low cholesterol diet and exercise. Discussed benefits of statin therapy with moderate -     Lipid panel -     TSH  Vitamin D deficiency Continue supplementation  -     VITAMIN D 25 Hydroxy (Vit-D Deficiency, Fractures)  Morbid obesity ([name]) Long discussion about weight loss, diet, and exercise Recommended diet heavy in fruits and veggies and low in animal meats, cheeses, and dairy products, appropriate calorie intake Discussed appropriate weight for height  Encouraged patient to restart exercise program Follow up at next visit  Medication management -     CBC with Differential/Platelet -     BASIC METABOLIC PANEL WITH GFR -     Hepatic function panel  Screening for cardiovascular condition -     EKG 12-Lead  Screening for hematuria or proteinuria -     Urinalysis, Complete (81001) -     Microalbumin / creatinine urine ratio  Needs mammogram Information provided - advised to schedule today  Discussed med's effects and SE's.  Screening labs and tests as requested with regular follow-up as recommended. Over 40 minutes of exam, counseling, chart review, and complex, high level critical decision making was performed this visit.   Future Appointments  Date Time Provider [name]  12/23/2017  9:00 AM Brittney Comber, NP [facility] None     HPI  66 y.o. AA female, divorced/widowed, retired but still occasionally substitute teaches, very active in church -  presents for a complete physical and follow up for has Essential hypertension; Mixed hyperlipidemia; Vitamin D deficiency; Type 2 diabetes with stage 2 chronic kidney disease GFR 60-89 ([name]); Morbid obesity ([name]); and Medication management on their problem list.. She is overdue for her mammogram; none abnormal in the past. She occasionally performs self breast exams.   BMI is Body mass index is 38.47 kg/m., she has not been working on diet but not exercise with slow weight loss Wt Readings from Last 3 Encounters:  12/23/16 197 lb (89.4 kg)  07/12/16 200 lb 3.2 oz (90.8 kg)  02/29/16 203 lb 12.8 oz (92.4 kg)   Her blood pressure has been controlled at home, today their BP is BP: 134/84 She does not workout. She denies chest pain, shortness of breath, dizziness.   She is not on cholesterol medication, currently managed by lifestyle changes. Her cholesterol is at goal. The cholesterol last visit was:   Lab Results  Component Value Date   CHOL 148 07/12/2016   HDL 50 (L) 07/12/2016   LDLCALC 83 07/12/2016   TRIG 77 07/12/2016   CHOLHDL 3.0 07/12/2016  She has been working on diet and exercise for T2DM, she is on bASA, she is not on ACE/ARB and denies increased appetite, nausea, paresthesia of the feet, polydipsia, polyuria, visual disturbances and vomiting. Last A1C in the office was:  Lab Results  Component Value Date   HGBA1C 6.3 (H) 07/12/2016   Last GFR: Lab Results  Component Value Date   GFRNONAA 63 07/12/2016   Patient is on Vitamin D  supplement but remained below goal at the last check:    Lab Results  Component Value Date   VD25OH 31 02/29/2016      Current Medications:  Current Outpatient Medications on File Prior to Visit  Medication Sig Dispense Refill  . amLODipine (NORVASC) 10 MG tablet TAKE 1 TABLET BY MOUTH EVERY DAY (TAKE 1/2 TO 1 TABLET DEPENDING ON BLOOD PRESSURE READING) 90 tablet 1  . aspirin 81 MG tablet Take 81 mg by mouth daily.    . Cholecalciferol (VITAMIN D3) 1000 units CAPS Take 1 cap daily 60 capsule   . hydrochlorothiazide (HYDRODIURIL) 25 MG tablet TAKE 1 TABLET EACH MORNING FOR BP & FLUID & SWELLING 90 tablet 1  . Magnesium 250 MG TABS Take by mouth daily.     No current facility-administered medications on file prior to visit.    Allergies:  Allergies  Allergen Reactions  . Ziac [Bisoprolol-Hydrochlorothiazide]     Flushing    Medical History:  She has Essential hypertension; Mixed hyperlipidemia; Vitamin D deficiency; Type 2 diabetes with stage 2 chronic kidney disease GFR 60-89 ([name]); Morbid obesity ([name]); and Medication management on their problem list. Health Maintenance:   Immunization History  Administered Date(s) Administered  . PPD Test 04/26/2013   Preventative care: Last colonoscopy: 09/2014 EGD: 09/2014 Dr. Collene Gray Last mammogram: 05/2014 - DUE reminded to call to set up  Last pap smear/pelvic exam: 2016 DONE DEXA: 2013  Prior vaccinations: TD or Tdap: 2009         Influenza: declines Pneumococcal: declines Prevnar13: declines Shingles/Zostavax: declines  [facility] eye doctor, last visit 02/2016 wears reading glasses - no report in system - request sent Dr. Evette Gray, dentist, last visit 06/2016  Patient Care Team: Brittney Pinto, MD as PCP - General (Internal Medicine) Brittney Craver, MD as Consulting Physician (Gastroenterology)  Surgical History:  She has a past surgical history that includes Cesarean section (1971); Knee arthroscopy (Right); Carpal tunnel release  (Right); and Tubal ligation. Family History:  Herfamily history includes Aneurysm in her sister; Diabetes in her brother and brother; Heart disease in her brother and brother; Hypertension in her sister; Stroke in her mother; Thyroid disease in her sister. Social History:  She reports that  has never smoked. she has never used smokeless tobacco. She reports that she drinks alcohol. She reports that she does not use drugs.  Review of Systems: Review of Systems  Constitutional: Negative for malaise/fatigue and weight loss.  HENT: Negative for hearing loss and tinnitus.   Eyes: Negative for blurred vision and double vision.  Respiratory: Negative for cough, shortness of breath and wheezing.   Cardiovascular: Negative for chest pain, palpitations, orthopnea, claudication and leg swelling.  Gastrointestinal: Negative for abdominal pain, blood in stool, constipation, diarrhea, heartburn, melena, nausea and vomiting.  Genitourinary: Negative.   Musculoskeletal: Negative for joint pain and myalgias.  Skin: Negative for rash.  Neurological: Negative for dizziness, tingling, sensory change, weakness and headaches.  Endo/Heme/Allergies: Negative for polydipsia.  Psychiatric/Behavioral: Negative.  Negative for depression. The patient is not nervous/anxious and does not  have insomnia.   All other systems reviewed and are negative.   Physical Exam: Estimated body mass index is 38.47 kg/m as calculated from the following:   Height as of this encounter: 5' (1.524 m).   Weight as of this encounter: 197 lb (89.4 kg). BP 134/84   Pulse 96   Temp (!) 97 F (36.1 C)   Ht 5' (1.524 m)   Wt 197 lb (89.4 kg)   SpO2 98%   BMI 38.47 kg/m  General Appearance: Well nourished, in no apparent distress.  Eyes: PERRLA, EOMs, conjunctiva no swelling or erythema, normal fundi and vessels.  Sinuses: No Frontal/maxillary tenderness  ENT/Mouth: Ext aud canals clear, normal light reflex with TMs without erythema,  bulging. Good dentition. No erythema, swelling, or exudate on post pharynx. Tonsils not swollen or erythematous. Hearing normal.  Neck: Supple, thyroid normal. No bruits  Respiratory: Respiratory effort normal, BS equal bilaterally without rales, rhonchi, wheezing or stridor.  Cardio: RRR without murmurs, rubs or gallops. Brisk peripheral pulses without edema.  Chest: symmetric, with normal excursions and percussion.  Breasts: Symmetric, without lumps, nipple discharge, retractions.  Abdomen: Soft, nontender, no guarding, rebound, hernias, masses, or organomegaly.  Lymphatics: Non tender without lymphadenopathy.  Genitourinary: Defer Musculoskeletal: Full ROM all peripheral extremities,5/5 strength, and normal gait.  Skin: Warm, dry without rashes, lesions, ecchymosis. Neuro: Cranial nerves intact, reflexes equal bilaterally. Normal muscle tone, no cerebellar symptoms. Sensation intact.  Psych: Awake and oriented X 3, normal affect, Insight and Judgment appropriate.   EKG: WNL no ST changes.  Gorden Harms Tommy Minichiello 9:18 AM [facility]

## 2016-12-23 ENCOUNTER — Other Ambulatory Visit: Payer: Self-pay | Admitting: Internal Medicine

## 2016-12-23 ENCOUNTER — Encounter: Payer: Self-pay | Admitting: Adult Health

## 2016-12-23 ENCOUNTER — Ambulatory Visit (INDEPENDENT_AMBULATORY_CARE_PROVIDER_SITE_OTHER): Payer: Medicare Other | Admitting: Adult Health

## 2016-12-23 VITALS — BP 134/84 | HR 96 | Temp 97.0°F | Ht 60.0 in | Wt 197.0 lb

## 2016-12-23 DIAGNOSIS — Z136 Encounter for screening for cardiovascular disorders: Secondary | ICD-10-CM

## 2016-12-23 DIAGNOSIS — Z1231 Encounter for screening mammogram for malignant neoplasm of breast: Secondary | ICD-10-CM

## 2016-12-23 DIAGNOSIS — I10 Essential (primary) hypertension: Secondary | ICD-10-CM

## 2016-12-23 DIAGNOSIS — D509 Iron deficiency anemia, unspecified: Secondary | ICD-10-CM

## 2016-12-23 DIAGNOSIS — Z0000 Encounter for general adult medical examination without abnormal findings: Secondary | ICD-10-CM

## 2016-12-23 DIAGNOSIS — Z1389 Encounter for screening for other disorder: Secondary | ICD-10-CM

## 2016-12-23 DIAGNOSIS — E119 Type 2 diabetes mellitus without complications: Secondary | ICD-10-CM

## 2016-12-23 DIAGNOSIS — N182 Chronic kidney disease, stage 2 (mild): Secondary | ICD-10-CM

## 2016-12-23 DIAGNOSIS — E782 Mixed hyperlipidemia: Secondary | ICD-10-CM

## 2016-12-23 DIAGNOSIS — Z0001 Encounter for general adult medical examination with abnormal findings: Secondary | ICD-10-CM

## 2016-12-23 DIAGNOSIS — Z79899 Other long term (current) drug therapy: Secondary | ICD-10-CM

## 2016-12-23 DIAGNOSIS — Z1211 Encounter for screening for malignant neoplasm of colon: Secondary | ICD-10-CM

## 2016-12-23 DIAGNOSIS — Z1212 Encounter for screening for malignant neoplasm of rectum: Secondary | ICD-10-CM

## 2016-12-23 DIAGNOSIS — E1122 Type 2 diabetes mellitus with diabetic chronic kidney disease: Secondary | ICD-10-CM

## 2016-12-23 DIAGNOSIS — E559 Vitamin D deficiency, unspecified: Secondary | ICD-10-CM

## 2016-12-23 NOTE — Patient Instructions (Addendum)
[facility]  7 a.m.-6:30 p.m., Monday 7 a.m.-5 p.m., Tuesday-Friday Schedule an appointment by calling [phone].  [facility] Schedule an appointment by calling [phone].  Say no to all questions -   Start exercise - start with 15-20 min walking daily and work up  Here is some information to help you keep your heart healthy: Move it! - Aim for 30 mins of activity every day. Take it slowly at first. Talk to [place] before starting any new exercise program.   Lose it.  -Body Mass Index (BMI) can indicate if you need to lose weight. A healthy range is 18.5-24.9. For a BMI calculator, go to [employer]  Waist Management -Excess abdominal fat is a risk factor for heart disease, diabetes, asthma, stroke and more. Ideal waist circumference is less than 35" for women and less than 40" for men.   Eat Right -focus on fruits, vegetables, whole grains, and meals you make yourself. Avoid foods with trans fat and high sugar/sodium content.   Snooze or Snore? - Loud snoring can be a sign of sleep apnea, a significant risk factor for high blood pressure, heart attach, stroke, and heart arrhythmias.  Kick the habit -Quit Smoking! Avoid second hand smoke. A single cigarette raises your blood pressure for 20 mins and increases the risk of heart attack and stroke for the next 24 hours.   Are Aspirin and Supplements right for you? -Add ENTERIC COATED low dose 81 mg Aspirin daily OR can do every other day if you have easy bruising to protect your heart and head. As well as to reduce risk of Colon Cancer by 20 %, Skin Cancer by 26 % , Melanoma by 46% and Pancreatic cancer by 60%  Say "No to Stress -There may be little you can do about problems that cause stress. However, techniques such as long walks, meditation, and exercise can help you manage it.   Start Now! - Make changes one at a time and set reasonable goals to increase your likelihood of success.

## 2016-12-24 LAB — URINALYSIS, COMPLETE
BACTERIA UA: NONE SEEN /HPF
Bilirubin Urine: NEGATIVE
Glucose, UA: NEGATIVE
HYALINE CAST: NONE SEEN /LPF
Hgb urine dipstick: NEGATIVE
KETONES UR: NEGATIVE
Nitrite: NEGATIVE
PH: 6 (ref 5.0–8.0)
Protein, ur: NEGATIVE
RBC / HPF: NONE SEEN /HPF (ref 0–2)
SPECIFIC GRAVITY, URINE: 1.019 (ref 1.001–1.03)

## 2016-12-24 LAB — CBC WITH DIFFERENTIAL/PLATELET
BASOS PCT: 0.8 %
Basophils Absolute: 62 cells/uL (ref 0–200)
EOS ABS: 242 {cells}/uL (ref 15–500)
Eosinophils Relative: 3.1 %
HCT: 40.3 % (ref 35.0–45.0)
HEMOGLOBIN: 13.6 g/dL (ref 11.7–15.5)
LYMPHS ABS: 2309 {cells}/uL (ref 850–3900)
MCH: 28.4 pg (ref 27.0–33.0)
MCHC: 33.7 g/dL (ref 32.0–36.0)
MCV: 84.1 fL (ref 80.0–100.0)
MPV: 10.5 fL (ref 7.5–12.5)
Monocytes Relative: 9 %
NEUTROS ABS: 4485 {cells}/uL (ref 1500–7800)
Neutrophils Relative %: 57.5 %
Platelets: 272 10*3/uL (ref 140–400)
RBC: 4.79 10*6/uL (ref 3.80–5.10)
RDW: 13.1 % (ref 11.0–15.0)
Total Lymphocyte: 29.6 %
WBC: 7.8 10*3/uL (ref 3.8–10.8)
WBCMIX: 702 {cells}/uL (ref 200–950)

## 2016-12-24 LAB — HEPATIC FUNCTION PANEL
AG Ratio: 1.5 (calc) (ref 1.0–2.5)
ALKALINE PHOSPHATASE (APISO): 51 U/L (ref 33–130)
ALT: 3 U/L — AB (ref 6–29)
AST: 12 U/L (ref 10–35)
Albumin: 4.4 g/dL (ref 3.6–5.1)
BILIRUBIN INDIRECT: 0.5 mg/dL (ref 0.2–1.2)
BILIRUBIN TOTAL: 0.6 mg/dL (ref 0.2–1.2)
Bilirubin, Direct: 0.1 mg/dL (ref 0.0–0.2)
Globulin: 2.9 g/dL (calc) (ref 1.9–3.7)
TOTAL PROTEIN: 7.3 g/dL (ref 6.1–8.1)

## 2016-12-24 LAB — BASIC METABOLIC PANEL WITH GFR
BUN: 19 mg/dL (ref 7–25)
CALCIUM: 9.9 mg/dL (ref 8.6–10.4)
CHLORIDE: 102 mmol/L (ref 98–110)
CO2: 31 mmol/L (ref 20–32)
CREATININE: 0.89 mg/dL (ref 0.50–0.99)
GFR, EST AFRICAN AMERICAN: 78 mL/min/{1.73_m2} (ref >=60)
GFR, EST NON AFRICAN AMERICAN: 67 mL/min/{1.73_m2} (ref >=60)
Glucose, Bld: 100 mg/dL — ABNORMAL HIGH (ref 65–99)
Potassium: 3.6 mmol/L (ref 3.5–5.3)
Sodium: 140 mmol/L (ref 135–146)

## 2016-12-24 LAB — LIPID PANEL
CHOL/HDL RATIO: 2.8 (calc) (ref <5.0)
Cholesterol: 150 mg/dL (ref <200)
HDL: 54 mg/dL (ref >50)
LDL Cholesterol (Calc): 80 mg/dL (calc)
NON-HDL CHOLESTEROL (CALC): 96 mg/dL (ref <130)
Triglycerides: 78 mg/dL (ref <150)

## 2016-12-24 LAB — MICROALBUMIN / CREATININE URINE RATIO
Creatinine, Urine: 137 mg/dL (ref 20–275)
MICROALB UR: 0.2 mg/dL
Microalb Creat Ratio: 1 mcg/mg creat (ref <30)

## 2016-12-24 LAB — HEMOGLOBIN A1C
EAG (MMOL/L): 7.4 (calc)
Hgb A1c MFr Bld: 6.3 % of total Hgb — ABNORMAL HIGH (ref <5.7)
MEAN PLASMA GLUCOSE: 134 (calc)

## 2016-12-24 LAB — MAGNESIUM: Magnesium: 1.9 mg/dL (ref 1.5–2.5)

## 2016-12-24 LAB — TSH: TSH: 1.59 mIU/L (ref 0.40–4.50)

## 2016-12-24 LAB — VITAMIN D 25 HYDROXY (VIT D DEFICIENCY, FRACTURES): VIT D 25 HYDROXY: 62 ng/mL (ref 30–100)

## 2017-01-21 ENCOUNTER — Ambulatory Visit
Admission: RE | Admit: 2017-01-21 | Discharge: 2017-01-21 | Disposition: A | Discharge status: Home / Self Care | Payer: Medicare Other | Source: Ambulatory Visit | Attending: Internal Medicine | Admitting: Internal Medicine

## 2017-01-21 DIAGNOSIS — Z1231 Encounter for screening mammogram for malignant neoplasm of breast: Secondary | ICD-10-CM

## 2017-01-23 ENCOUNTER — Encounter: Payer: Self-pay | Admitting: Internal Medicine

## 2017-02-24 ENCOUNTER — Other Ambulatory Visit: Payer: Self-pay | Admitting: Internal Medicine

## 2017-02-24 DIAGNOSIS — R609 Edema, unspecified: Secondary | ICD-10-CM

## 2017-02-24 DIAGNOSIS — I10 Essential (primary) hypertension: Secondary | ICD-10-CM

## 2017-06-11 ENCOUNTER — Ambulatory Visit: Payer: Self-pay | Admitting: Internal Medicine

## 2017-06-26 ENCOUNTER — Ambulatory Visit: Payer: Medicare Other | Admitting: Internal Medicine

## 2017-06-26 ENCOUNTER — Encounter: Payer: Self-pay | Admitting: Internal Medicine

## 2017-06-26 VITALS — BP 126/84 | HR 106 | Temp 98.6°F | Resp 16 | Ht 60.0 in | Wt 196.8 lb

## 2017-06-26 DIAGNOSIS — I10 Essential (primary) hypertension: Secondary | ICD-10-CM

## 2017-06-26 DIAGNOSIS — Z6838 Body mass index (BMI) 38.0-38.9, adult: Secondary | ICD-10-CM | POA: Diagnosis not present

## 2017-06-26 DIAGNOSIS — E1122 Type 2 diabetes mellitus with diabetic chronic kidney disease: Secondary | ICD-10-CM | POA: Diagnosis not present

## 2017-06-26 DIAGNOSIS — Z79899 Other long term (current) drug therapy: Secondary | ICD-10-CM

## 2017-06-26 DIAGNOSIS — E782 Mixed hyperlipidemia: Secondary | ICD-10-CM

## 2017-06-26 DIAGNOSIS — N182 Chronic kidney disease, stage 2 (mild): Secondary | ICD-10-CM | POA: Diagnosis not present

## 2017-06-26 DIAGNOSIS — E559 Vitamin D deficiency, unspecified: Secondary | ICD-10-CM

## 2017-06-26 NOTE — Patient Instructions (Signed)

## 2017-06-26 NOTE — Progress Notes (Signed)
This very nice 67 y.o.  DBFpresents for 6 month follow up with HTN, HLD, Pre-Diabetes and Vitamin D Deficiency.      Patient is treated for HTN (2002) & BP has been controlled at home. Today's BP is at goal -  126/84. Patient has had no complaints of any cardiac type chest pain, palpitations, dyspnea / orthopnea / PND, dizziness, claudication, or dependent edema.     Hyperlipidemia is controlled with diet & meds. Patient denies myalgias or other med SE's. Last Lipids were at goal: Lab Results  Component Value Date   CHOL 150 12/23/2016   HDL 54 12/23/2016   LDLCALC 80 12/23/2016   TRIG 78 12/23/2016   CHOLHDL 2.8 12/23/2016      Also, the patient has history of  Morbid Obesity (BMI 38) and consequent T2_NIDDM (2015) marginally controlled & not at goal and she has had no symptoms of reactive hypoglycemia, diabetic polys, paresthesias or visual blurring.  Last A1c was not at goal: Lab Results  Component Value Date   HGBA1C 6.3 (H) 12/23/2016      Further, the patient also has history of Vitamin D Deficiency ("32"/2016) and supplements vitamin D without any suspected side-effects. Last vitamin D was at goal: Lab Results  Component Value Date   VD25OH 62 12/23/2016   Current Outpatient Medications on File Prior to Visit  Medication Sig  . amLODipine (NORVASC) 10 MG tablet TAKE 1 TABLET BY MOUTH EVERY DAY (TAKE 1/2 TO 1 TABLET DEPENDING ON BLOOD PRESSURE READING)  . aspirin 81 MG tablet Take 81 mg by mouth daily.  . Cholecalciferol (VITAMIN D3) 1000 units CAPS Take 1 cap daily  . hydrochlorothiazide (HYDRODIURIL) 25 MG tablet TAKE 1 TABLET EACH MORNING FOR BP & FLUID & SWELLING  . Magnesium 250 MG TABS Take by mouth daily.   No current facility-administered medications on file prior to visit.    Allergies  Allergen Reactions  . Ziac [Bisoprolol-Hydrochlorothiazide]     Flushing    PMHx:   Past Medical History:  Diagnosis Date  . Hypertension   . Obesity   . Pre-diabetes    . Vitamin D deficiency    Immunization History  Administered Date(s) Administered  . PPD Test 04/26/2013   Past Surgical History:  Procedure Laterality Date  . CARPAL TUNNEL RELEASE Right   . CESAREAN SECTION  1971  . KNEE ARTHROSCOPY Right   . TUBAL LIGATION     FHx:    Reviewed / unchanged  SHx:    Reviewed / unchanged   Systems Review:  Constitutional: Denies fever, chills, wt changes, headaches, insomnia, fatigue, night sweats, change in appetite. Eyes: Denies redness, blurred vision, diplopia, discharge, itchy, watery eyes.  ENT: Denies discharge, congestion, post nasal drip, epistaxis, sore throat, earache, hearing loss, dental pain, tinnitus, vertigo, sinus pain, snoring.  CV: Denies chest pain, palpitations, irregular heartbeat, syncope, dyspnea, diaphoresis, orthopnea, PND, claudication or edema. Respiratory: denies cough, dyspnea, DOE, pleurisy, hoarseness, laryngitis, wheezing.  Gastrointestinal: Denies dysphagia, odynophagia, heartburn, reflux, water brash, abdominal pain or cramps, nausea, vomiting, bloating, diarrhea, constipation, hematemesis, melena, hematochezia  or hemorrhoids. Genitourinary: Denies dysuria, frequency, urgency, nocturia, hesitancy, discharge, hematuria or flank pain. Musculoskeletal: Denies arthralgias, myalgias, stiffness, jt. swelling, pain, limping or strain/sprain.  Skin: Denies pruritus, rash, hives, warts, acne, eczema or change in skin lesion(s). Neuro: No weakness, tremor, incoordination, spasms, paresthesia or pain. Psychiatric: Denies confusion, memory loss or sensory loss. Endo: Denies change in weight, skin or hair change.  Heme/Lymph: No excessive bleeding, bruising or enlarged lymph nodes.  Physical Exam  BP 126/88   Pulse (!) 106   Temp 98.6 F (37 C)   Resp 16   Wt 196 lb 12.8 oz (89.3 kg)   SpO2 99%   BMI 38.43 kg/m   Appears  well nourished, well groomed  and in no distress.  Eyes: PERRLA, EOMs, conjunctiva no  swelling or erythema. Sinuses: No frontal/maxillary tenderness ENT/Mouth: EAC's clear, TM's nl w/o erythema, bulging. Nares clear w/o erythema, swelling, exudates. Oropharynx clear without erythema or exudates. Oral hygiene is good. Tongue normal, non obstructing. Hearing intact.  Neck: Supple. Thyroid not palpable. Car 2+/2+ without bruits, nodes or JVD. Chest: Respirations nl with BS clear & equal w/o rales, rhonchi, wheezing or stridor.  Cor: Heart sounds normal w/ regular rate and rhythm without sig. murmurs, gallops, clicks or rubs. Peripheral pulses normal and equal  without edema.  Abdomen: Soft & bowel sounds normal. Non-tender w/o guarding, rebound, hernias, masses or organomegaly.  Lymphatics: Unremarkable.  Musculoskeletal: Full ROM all peripheral extremities, joint stability, 5/5 strength and normal gait.  Skin: Warm, dry without exposed rashes, lesions or ecchymosis apparent.  Neuro: Cranial nerves intact, reflexes equal bilaterally. Sensory-motor testing grossly intact. Tendon reflexes grossly intact.  Pysch: Alert & oriented x 3.  Insight and judgement nl & appropriate. No ideations.  Assessment and Plan:  1. Essential hypertension  - Continue medication, monitor blood pressure at home.  - Continue DASH diet.  Reminder to go to the ER if any CP,  SOB, nausea, dizziness, severe HA, changes vision/speech.  - CBC with Differential/Platelet - COMPLETE METABOLIC PANEL WITH GFR - Magnesium - TSH  2. Hyperlipidemia, mixed  - Continue diet/meds, exercise,& lifestyle modifications.  - Continue monitor periodic cholesterol/liver & renal functions   - Lipid panel - TSH  3. Type 2 diabetes mellitus with stage 2 chronic kidney disease, without long-term current use of insulin (HCC)  - Continue diet, exercise, lifestyle modifications.  - Monitor appropriate labs.  - Hemoglobin A1c - Insulin, random  4. Vitamin D deficiency  - Continue supplementation.  - VITAMIN D 25  Hydroxyl  5. Medication management  - CBC with Differential/Platelet - COMPLETE METABOLIC PANEL WITH GFR - Magnesium - Lipid panel - TSH - Hemoglobin A1c - Insulin, random - VITAMIN D 25 Hydroxyl      Discussed  regular exercise, BP monitoring, weight control to achieve/maintain BMI less than 25 and discussed med and SE's. Recommended labs to assess and monitor clinical status with further disposition pending results of labs. Over 30 minutes of exam, counseling, chart review was performed.

## 2017-06-27 LAB — CBC WITH DIFFERENTIAL/PLATELET
BASOS ABS: 57 {cells}/uL (ref 0–200)
Basophils Relative: 0.7 %
EOS PCT: 2.5 %
Eosinophils Absolute: 203 cells/uL (ref 15–500)
HCT: 42.1 % (ref 35.0–45.0)
HEMOGLOBIN: 14.5 g/dL (ref 11.7–15.5)
Lymphs Abs: 2608 cells/uL (ref 850–3900)
MCH: 28.6 pg (ref 27.0–33.0)
MCHC: 34.4 g/dL (ref 32.0–36.0)
MCV: 83 fL (ref 80.0–100.0)
MONOS PCT: 8.7 %
MPV: 10.6 fL (ref 7.5–12.5)
NEUTROS ABS: 4528 {cells}/uL (ref 1500–7800)
NEUTROS PCT: 55.9 %
Platelets: 290 10*3/uL (ref 140–400)
RBC: 5.07 10*6/uL (ref 3.80–5.10)
RDW: 13.7 % (ref 11.0–15.0)
Total Lymphocyte: 32.2 %
WBC mixed population: 705 cells/uL (ref 200–950)
WBC: 8.1 10*3/uL (ref 3.8–10.8)

## 2017-06-27 LAB — VITAMIN D 25 HYDROXY (VIT D DEFICIENCY, FRACTURES): Vit D, 25-Hydroxy: 49 ng/mL (ref 30–100)

## 2017-06-27 LAB — HEMOGLOBIN A1C
HEMOGLOBIN A1C: 6.1 %{Hb} — AB (ref <5.7)
Mean Plasma Glucose: 128 (calc)
eAG (mmol/L): 7.1 (calc)

## 2017-06-27 LAB — COMPLETE METABOLIC PANEL WITH GFR
AG Ratio: 1.3 (calc) (ref 1.0–2.5)
ALT: 3 U/L — AB (ref 6–29)
AST: 13 U/L (ref 10–35)
Albumin: 4.2 g/dL (ref 3.6–5.1)
Alkaline phosphatase (APISO): 55 U/L (ref 33–130)
BUN/Creatinine Ratio: 21 (calc) (ref 6–22)
BUN: 22 mg/dL (ref 7–25)
CO2: 30 mmol/L (ref 20–32)
CREATININE: 1.03 mg/dL — AB (ref 0.50–0.99)
Calcium: 10.1 mg/dL (ref 8.6–10.4)
Chloride: 102 mmol/L (ref 98–110)
GFR, Est African American: 65 mL/min/{1.73_m2} (ref >=60)
GFR, Est Non African American: 56 mL/min/{1.73_m2} — ABNORMAL LOW (ref >=60)
GLUCOSE: 113 mg/dL — AB (ref 65–99)
Globulin: 3.3 g/dL (calc) (ref 1.9–3.7)
Potassium: 4.1 mmol/L (ref 3.5–5.3)
Sodium: 141 mmol/L (ref 135–146)
Total Bilirubin: 0.6 mg/dL (ref 0.2–1.2)
Total Protein: 7.5 g/dL (ref 6.1–8.1)

## 2017-06-27 LAB — MAGNESIUM: MAGNESIUM: 1.9 mg/dL (ref 1.5–2.5)

## 2017-06-27 LAB — INSULIN, RANDOM: Insulin: 44 u[IU]/mL — ABNORMAL HIGH (ref 2.0–19.6)

## 2017-06-27 LAB — LIPID PANEL
CHOLESTEROL: 157 mg/dL (ref <200)
HDL: 47 mg/dL — AB (ref >50)
LDL CHOLESTEROL (CALC): 95 mg/dL
Non-HDL Cholesterol (Calc): 110 mg/dL (calc) (ref <130)
TRIGLYCERIDES: 66 mg/dL (ref <150)
Total CHOL/HDL Ratio: 3.3 (calc) (ref <5.0)

## 2017-06-27 LAB — TSH: TSH: 2.05 mIU/L (ref 0.40–4.50)

## 2017-06-29 ENCOUNTER — Encounter: Payer: Self-pay | Admitting: Internal Medicine

## 2017-08-25 ENCOUNTER — Other Ambulatory Visit: Payer: Self-pay | Admitting: Adult Health

## 2017-08-25 DIAGNOSIS — I10 Essential (primary) hypertension: Secondary | ICD-10-CM

## 2017-08-25 DIAGNOSIS — R609 Edema, unspecified: Secondary | ICD-10-CM

## 2017-09-12 ENCOUNTER — Other Ambulatory Visit: Payer: Self-pay | Admitting: Internal Medicine

## 2017-09-30 NOTE — Progress Notes (Signed)
MEDICARE VISIT AND 3 MONTH FOLLOW UP  Assessment:    Essential hypertension - continue medications, DASH diet, exercise and monitor at home. Call if greater than 130/80.  - CBC with Differential/Platelet - BASIC METABOLIC PANEL WITH GFR - Hepatic function panel - TSH   Diabetes mellitus with CKD stage 2 Discussed general issues about diabetes pathophysiology and management., Educational material distributed., Suggested low cholesterol diet., Encouraged aerobic exercise., Discussed foot care., Reminded to get yearly retinal exam. - Hemoglobin A1c - HM DIABETES FOOT EXAM  Mixed hyperlipidemia -continue medications, check lipids, decrease fatty foods, increase activity.  - Lipid panel   Vitamin D deficiency - Vit D  25 hydroxy (rtn osteoporosis monitoring)  Anemia, iron deficiency - monitor, continue iron supp with Vitamin C and increase green leafy veggies   Morbid Obesity Obesity with co morbidities- long discussion about weight loss, diet, and exercise   Medication management - Magnesium  Encounter for Medicare annual wellness exam Get eye exam  Morbid Obesity with co morbidities - long discussion about weight loss, diet, and exercise   Continue diet and meds as discussed. Further disposition pending results of labs. Discussed med's effects and SE's.   Over 30 minutes of exam, counseling, chart review, and critical decision making was performed  Future Appointments  Date Time Provider [place]  12/23/2017  9:00 AM Liane Comber, NP [facility] None    Plan:   During the course of the visit the patient was educated and counseled about appropriate screening and preventive services including:    Pneumococcal vaccine   Prevnar 13  Influenza vaccine  Td vaccine  Screening electrocardiogram  Bone densitometry screening  Colorectal cancer screening  Diabetes screening  Glaucoma screening  Nutrition counseling   Advanced  directives: requested   Subjective:   HPI 67 y.o. female  presents for 3 month follow up on hypertension, cholesterol, diabetes and vitamin D deficiency and medicare wellness.   She continues to have right lateral hip pain, worse at night with lying down on that side. No night pain, no anterior hip pain or back pain. No trouble during the day.    Her blood pressure has been controlled at home, today her BP is BP: 122/68.  She does workout, kind of, will walk with her family.   She denies chest pain, shortness of breath, dizziness.  She is not on cholesterol medication and denies myalgias. Her cholesterol is at goal. The cholesterol was:   Lab Results  Component Value Date   CHOL 157 06/26/2017   HDL 47 (L) 06/26/2017   LDLCALC 95 06/26/2017   TRIG 66 06/26/2017   CHOLHDL 3.3 06/26/2017    She has a history of DM that is currently controlled with diet/exercise, she is not on bASA, she is not on ACE/ARB, and denies  paresthesia of the feet, polydipsia, polyuria and visual disturbances. Last A1C was:  Lab Results  Component Value Date   HGBA1C 6.1 (H) 06/26/2017   Lab Results  Component Value Date   GFRAA 65 06/26/2017   Patient is on Vitamin D supplement, 2000 IU daily Lab Results  Component Value Date   VD25OH 49 06/26/2017  B MI is Body mass index is 38.86 kg/m., she is working on diet and exercise. Wt Readings from Last 3 Encounters:  10/03/17 199 lb (90.3 kg)  06/26/17 196 lb 12.8 oz (89.3 kg)  12/23/16 197 lb (89.4 kg)     Current Medications:  Current Outpatient Medications on File Prior to Visit  Medication Sig Dispense Refill  . amLODipine (NORVASC) 10 MG tablet TAKE 1 TABLET BY MOUTH EVERY DAY (TAKE 1/2 TO 1 TABLET DEPENDING ON BLOOD PRESSURE READING) 90 tablet 1  . aspirin 81 MG tablet Take 81 mg by mouth daily.    . Cholecalciferol (VITAMIN D3) 1000 units CAPS Take 1 cap daily 60 capsule   . hydrochlorothiazide (HYDRODIURIL) 25 MG tablet TAKE 1 TABLET EACH  MORNING FOR BP & FLUID & SWELLING 90 tablet 1  . Magnesium 250 MG TABS Take by mouth daily.     No current facility-administered medications on file prior to visit.    Medical History:  Past Medical History:  Diagnosis Date  . Hypertension   . Obesity   . Pre-diabetes   . Vitamin D deficiency    Allergies Allergies  Allergen Reactions  . Ziac [Bisoprolol-Hydrochlorothiazide]     Flushing    Immunization History  Administered Date(s) Administered  . PPD Test 04/26/2013   Preventative care: Last colonoscopy: 09/2014 EGD: 09/2014 Dr. Collene Mares Last mammogram: 01/2017 Last pap smear/pelvic exam: 2016 declines another DEXA: 2013  Prior vaccinations: TD or Tdap: 2009         Influenza: declines Pneumococcal: declines Prevnar13: declines Shingles/Zostavax: declines   Names of Other Physician/Practitioners you currently use: 1. [facility] here for primary care 2. [facility], last visit 02/2016 wears reading glasses- need follow up 3. Dr. Evette Doffing, dentist, last visit 06/2016 Patient Care Team: Unk Pinto, MD as PCP - General (Internal Medicine) Juanita Craver, MD as Consulting Physician (Gastroenterology)  SURGICAL HISTORY She  has a past surgical history that includes Cesarean section (1971); Knee arthroscopy (Right); Carpal tunnel release (Right); and Tubal ligation. FAMILY HISTORY Her family history includes Aneurysm in her sister; Diabetes in her brother and brother; Heart disease in her brother and brother; Hypertension in her sister; Stroke in her mother; Thyroid disease in her sister. SOCIAL HISTORY She  reports that she has never smoked. She has never used smokeless tobacco. She reports that she drinks alcohol. She reports that she does not use drugs.  Review of Systems:  Review of Systems  Constitutional: Negative.   HENT: Negative.   Eyes: Negative.   Respiratory: Negative.   Cardiovascular: Negative.    Gastrointestinal: Negative.   Genitourinary: Negative.   Musculoskeletal: Negative.   Skin: Negative.   Neurological: Negative.   Endo/Heme/Allergies: Negative.   Psychiatric/Behavioral: Negative.    Physical Exam: BP 122/68   Pulse 67   Temp (!) 97.3 F (36.3 C)   Resp 16   Ht 5' (1.524 m)   Wt 199 lb (90.3 kg)   SpO2 98%   BMI 38.86 kg/m  Wt Readings from Last 3 Encounters:  10/03/17 199 lb (90.3 kg)  06/26/17 196 lb 12.8 oz (89.3 kg)  12/23/16 197 lb (89.4 kg)   General Appearance: Well nourished, in no apparent distress. Eyes: PERRLA, EOMs, conjunctiva no swelling or erythema Sinuses: No Frontal/maxillary tenderness ENT/Mouth: Ext aud canals clear, TMs without erythema, bulging. No erythema, swelling, or exudate on post pharynx.  Tonsils not swollen or erythematous. Hearing normal.  Neck: Supple, thyroid normal.  Respiratory: Respiratory effort normal, BS equal bilaterally without rales, rhonchi, wheezing or stridor.  Cardio: RRR with no MRGs. Brisk peripheral pulses without edema.  Abdomen: Soft, + BS, obese  Non tender, no guarding, rebound, hernias, masses. Lymphatics: Non tender without lymphadenopathy.  Musculoskeletal: Full ROM, 5/5 strength, Normal gait, Right hip: positives: tenderness over greater trochanter  without SI pain.Good distal sensations and pulses bilaterally. Skin: Warm, dry without rashes, lesions, ecchymosis.  Neuro: Cranial nerves intact. No cerebellar symptoms.  Psych: Awake and oriented X 3, normal affect, Insight and Judgment appropriate.   Medicare Attestation I have personally reviewed: The patient's medical and social history Their use of alcohol, tobacco or illicit drugs Their current medications and supplements The patient's functional ability including ADLs,fall risks, home safety risks, cognitive, and hearing and visual impairment Diet and physical activities Evidence for depression or mood disorders  The patient's weight, height,  BMI, and visual acuity have been recorded in the chart.  I have made referrals, counseling, and provided education to the patient based on review of the above and I have provided the patient with a written personalized care plan for preventive services.    [name], PA-C 9:15 AM [facility]

## 2017-10-03 ENCOUNTER — Encounter: Payer: Self-pay | Admitting: Physician Assistant

## 2017-10-03 ENCOUNTER — Ambulatory Visit: Payer: Medicare Other | Admitting: Physician Assistant

## 2017-10-03 VITALS — BP 122/68 | HR 67 | Temp 97.3°F | Resp 16 | Ht 60.0 in | Wt 199.0 lb

## 2017-10-03 DIAGNOSIS — E782 Mixed hyperlipidemia: Secondary | ICD-10-CM

## 2017-10-03 DIAGNOSIS — Z79899 Other long term (current) drug therapy: Secondary | ICD-10-CM | POA: Diagnosis not present

## 2017-10-03 DIAGNOSIS — Z0000 Encounter for general adult medical examination without abnormal findings: Secondary | ICD-10-CM

## 2017-10-03 DIAGNOSIS — E559 Vitamin D deficiency, unspecified: Secondary | ICD-10-CM

## 2017-10-03 DIAGNOSIS — Z0001 Encounter for general adult medical examination with abnormal findings: Secondary | ICD-10-CM | POA: Diagnosis not present

## 2017-10-03 DIAGNOSIS — R6889 Other general symptoms and signs: Secondary | ICD-10-CM | POA: Diagnosis not present

## 2017-10-03 DIAGNOSIS — E1122 Type 2 diabetes mellitus with diabetic chronic kidney disease: Secondary | ICD-10-CM

## 2017-10-03 DIAGNOSIS — I10 Essential (primary) hypertension: Secondary | ICD-10-CM

## 2017-10-03 DIAGNOSIS — N182 Chronic kidney disease, stage 2 (mild): Secondary | ICD-10-CM

## 2017-10-03 NOTE — Patient Instructions (Addendum)
Needs eye exam, schedule that.   Aleve is an antiinflammatory, can take 1 pill twice a day for 2 weeks and then take as needed.  You can take tylenol (500mg ) or tylenol arthritis (650mg ) with the meloxicam/antiinflammatories. The max you can take of tylenol a day is 3000mg  daily, this is a max of 6 pills a day of the regular tyelnol (500mg ) or a max of 4 a day of the tylenol arthritis (650mg ) as long as no other medications you are taking contain tylenol.   Aleve can cause inflammation in your stomach and can cause ulcers or bleeding, this will look like black tarry stools Make sure you take your aleve with food Can take with zantac  Bursitis Bursitis is inflammation and irritation of a bursa, which is one of the small, fluid-filled sacs that cushion and protect the moving parts of your body. These sacs are located between bones and muscles, muscle attachments, or skin areas next to bones. A bursa protects these structures from the wear and tear that results from frequent movement. An inflamed bursa causes pain and swelling. Fluid may build up inside the sac. Bursitis is most common near joints, especially the knees, elbows, hips, and shoulders. What are the causes? Bursitis can be caused by:  Injury from: ? A direct blow, like falling on your knee or elbow. ? Overuse of a joint (repetitive stress).  Infection. This can happen if bacteria gets into a bursa through a cut or scrape near a joint.  Diseases that cause joint inflammation, such as gout and rheumatoid arthritis.  What increases the risk? You may be at risk for bursitis if you:  Have a job or hobby that involves a lot of repetitive stress on your joints.  Have a condition that weakens your body's defense system (immune system), such as diabetes, cancer, or HIV.  Lift and reach overhead often.  Kneel or lean on hard surfaces often.  Run or walk often.  What are the signs or symptoms? The most common signs and symptoms of  bursitis are:  Pain that gets worse when you move the affected body part or put weight on it.  Inflammation.  Stiffness.  Other signs and symptoms may include:  Redness.  Tenderness.  Warmth.  Pain that continues after rest.  Fever and chills. This may occur in bursitis caused by infection.  How is this diagnosed? Bursitis may be diagnosed by:  Medical history and physical exam.  MRI.  A procedure to drain fluid from the bursa with a needle (aspiration). The fluid may be checked for signs of infection or gout.  Blood tests to rule out other causes of inflammation.  How is this treated? Bursitis can usually be treated at home with rest, ice, compression, and elevation (RICE). For mild bursitis, RICE treatment may be all you need. Other treatments may include:  Nonsteroidal anti-inflammatory drugs (NSAIDs) to treat pain and inflammation.  Corticosteroids to fight inflammation. You may have these drugs injected into and around the area of bursitis.  Aspiration of bursitis fluid to relieve pain and improve movement.  Antibiotic medicine to treat an infected bursa.  A splint, brace, or walking aid.  Physical therapy if you continue to have pain or limited movement.  Surgery to remove a damaged or infected bursa. This may be needed if you have a very bad case of bursitis or if other treatments have not worked.  Follow these instructions at home:  Take medicines only as directed by your health  care provider.  If you were prescribed an antibiotic medicine, finish it all even if you start to feel better.  Rest the affected area as directed by your health care provider. ? Keep the area elevated. ? Avoid activities that make pain worse.  Apply ice to the injured area: ? Place ice in a plastic bag. ? Place a towel between your skin and the bag. ? Leave the ice on for 20 minutes, 2-3 times a day.  Use splints, braces, pads, or walking aids as directed by your health  care provider.  Keep all follow-up visits as directed by your health care provider. This is important. How is this prevented?  Wear knee pads if you kneel often.  Wear sturdy running or walking shoes that fit you well.  Take regular breaks from repetitive activity.  Warm up by stretching before doing any strenuous activity.  Maintain a healthy weight or lose weight as recommended by your health care provider. Ask your health care provider if you need help.  Exercise regularly. Start any new physical activity gradually. Contact a health care provider if:  Your bursitis is not responding to treatment or home care.  You have a fever.  You have chills. This information is not intended to replace advice given to you by your health care provider. Make sure you discuss any questions you have with your health care provider. Document Released: 12/22/1999 Document Revised: 06/01/2015 Document Reviewed: 03/15/2013 Elsevier Interactive Patient Education  [employer].  Being a woman you may not have the typical symptoms of a heart attack.  You may not have any pain OR you may have atypical pain such as jaw pain, upper back pain, arm pain, "my bra feels to tight" and you will often have symptoms with it like below.  Symptoms for a heart attack will likely occur when you exert your self or exercise and include: Shortness of breath Sweating Nausea Dizziness Fast or irregular heart beats Fatigue   It makes me feel better if my patients get their heart rate up with exercise once or twice a week and pay close attention to your body. If there is ANY change in your exercise capacity or if you have symptoms above, please STOP and call 911 or call to come to the office.   Here is some information to help you keep your heart healthy: Move it! - Aim for 30 mins of activity every day. Take it slowly at first. Talk to [place] before starting any new exercise program.   Lose it.  -Body Mass Index  (BMI) can indicate if you need to lose weight. A healthy range is 18.5-24.9. For a BMI calculator, go to [employer]  Waist Management -Excess abdominal fat is a risk factor for heart disease, diabetes, asthma, stroke and more. Ideal waist circumference is less than 35" for women and less than 40" for men.   Eat Right -focus on fruits, vegetables, whole grains, and meals you make yourself. Avoid foods with trans fat and high sugar/sodium content.   Snooze or Snore? - Loud snoring can be a sign of sleep apnea, a significant risk factor for high blood pressure, heart attach, stroke, and heart arrhythmias.  Kick the habit -Quit Smoking! Avoid second hand smoke. A single cigarette raises your blood pressure for 20 mins and increases the risk of heart attack and stroke for the next 24 hours.   Are Aspirin and Supplements right for you? -Add ENTERIC COATED low dose 81 mg Aspirin daily  OR can do every other day if you have easy bruising to protect your heart and head. As well as to reduce risk of Colon Cancer by 20 %, Skin Cancer by 26 % , Melanoma by 46% and Pancreatic cancer by 60%  Say "No to Stress -There may be little you can do about problems that cause stress. However, techniques such as long walks, meditation, and exercise can help you manage it.   Start Now! - Make changes one at a time and set reasonable goals to increase your likelihood of success.       When it comes to diets, agreement about the perfect plan isn't easy to find, even among the experts. Experts at the [employer] developed an idea known as the Healthy Eating Plate. Just imagine a plate divided into logical, healthy portions.  The emphasis is on diet quality:  Load up on vegetables and fruits - one-half of your plate: Aim for color and variety, and remember that potatoes don't count.  Go for whole grains - one-quarter of your plate: Whole wheat, barley, wheat berries, quinoa, oats, brown  rice, and foods made with them. If you want pasta, go with whole wheat pasta.  Protein power - one-quarter of your plate: Fish, chicken, beans, and nuts are all healthy, versatile protein sources. Limit red meat.  The diet, however, does go beyond the plate, offering a few other suggestions.  Use healthy plant oils, such as olive, canola, soy, corn, sunflower and peanut. Check the labels, and avoid partially hydrogenated oil, which have unhealthy trans fats.  If you're thirsty, drink water. Coffee and tea are good in moderation, but skip sugary drinks and limit milk and dairy products to one or two daily servings.  The type of carbohydrate in the diet is more important than the amount. Some sources of carbohydrates, such as vegetables, fruits, whole grains, and beans-are healthier than others.  Finally, stay active.

## 2017-10-04 LAB — COMPLETE METABOLIC PANEL WITH GFR
AG RATIO: 1.4 (calc) (ref 1.0–2.5)
ALBUMIN MSPROF: 4.1 g/dL (ref 3.6–5.1)
ALT: 4 U/L — ABNORMAL LOW (ref 6–29)
AST: 11 U/L (ref 10–35)
Alkaline phosphatase (APISO): 51 U/L (ref 33–130)
BUN: 18 mg/dL (ref 7–25)
CALCIUM: 9.9 mg/dL (ref 8.6–10.4)
CO2: 31 mmol/L (ref 20–32)
CREATININE: 0.86 mg/dL (ref 0.50–0.99)
Chloride: 103 mmol/L (ref 98–110)
GFR, EST AFRICAN AMERICAN: 80 mL/min/{1.73_m2} (ref >=60)
GFR, EST NON AFRICAN AMERICAN: 69 mL/min/{1.73_m2} (ref >=60)
GLOBULIN: 3 g/dL (ref 1.9–3.7)
Glucose, Bld: 102 mg/dL — ABNORMAL HIGH (ref 65–99)
POTASSIUM: 4.2 mmol/L (ref 3.5–5.3)
SODIUM: 142 mmol/L (ref 135–146)
TOTAL PROTEIN: 7.1 g/dL (ref 6.1–8.1)
Total Bilirubin: 0.5 mg/dL (ref 0.2–1.2)

## 2017-10-04 LAB — CBC WITH DIFFERENTIAL/PLATELET
BASOS PCT: 0.9 %
Basophils Absolute: 60 cells/uL (ref 0–200)
Eosinophils Absolute: 201 cells/uL (ref 15–500)
Eosinophils Relative: 3 %
HCT: 41.7 % (ref 35.0–45.0)
HEMOGLOBIN: 13.9 g/dL (ref 11.7–15.5)
Lymphs Abs: 2097 cells/uL (ref 850–3900)
MCH: 28.2 pg (ref 27.0–33.0)
MCHC: 33.3 g/dL (ref 32.0–36.0)
MCV: 84.6 fL (ref 80.0–100.0)
MPV: 10.3 fL (ref 7.5–12.5)
Monocytes Relative: 8.5 %
NEUTROS PCT: 56.3 %
Neutro Abs: 3772 cells/uL (ref 1500–7800)
Platelets: 276 10*3/uL (ref 140–400)
RBC: 4.93 10*6/uL (ref 3.80–5.10)
RDW: 13.1 % (ref 11.0–15.0)
Total Lymphocyte: 31.3 %
WBC: 6.7 10*3/uL (ref 3.8–10.8)
WBCMIX: 570 {cells}/uL (ref 200–950)

## 2017-10-04 LAB — HEMOGLOBIN A1C
EAG (MMOL/L): 7.7 (calc)
Hgb A1c MFr Bld: 6.5 % of total Hgb — ABNORMAL HIGH (ref <5.7)
MEAN PLASMA GLUCOSE: 140 (calc)

## 2017-10-04 LAB — LIPID PANEL
CHOL/HDL RATIO: 3 (calc) (ref <5.0)
Cholesterol: 136 mg/dL (ref <200)
HDL: 46 mg/dL — ABNORMAL LOW (ref >50)
LDL CHOLESTEROL (CALC): 76 mg/dL
NON-HDL CHOLESTEROL (CALC): 90 mg/dL (ref <130)
Triglycerides: 55 mg/dL (ref <150)

## 2017-10-04 LAB — TSH: TSH: 1.61 mIU/L (ref 0.40–4.50)

## 2017-12-23 ENCOUNTER — Encounter: Payer: Self-pay | Admitting: Adult Health

## 2017-12-24 ENCOUNTER — Encounter: Payer: Self-pay | Admitting: Physician Assistant

## 2017-12-30 DIAGNOSIS — N182 Chronic kidney disease, stage 2 (mild): Secondary | ICD-10-CM

## 2017-12-30 DIAGNOSIS — E1122 Type 2 diabetes mellitus with diabetic chronic kidney disease: Secondary | ICD-10-CM | POA: Insufficient documentation

## 2017-12-30 NOTE — Progress Notes (Deleted)
Complete Physical  Assessment and Plan:  Diagnoses and all orders for this visit:  Encounter for routine adult physical exam with abnormal findings  Essential hypertension Currently well controlled; continue medications Monitor blood pressure at home; call if consistently over 130/80 Continue DASH diet.   Reminder to go to the ER if any CP, SOB, nausea, dizziness, severe HA, changes vision/speech, left arm numbness and tingling and jaw pain. -     Magnesium       -     EKG 12-Lead  Diabetes mellitus with stage 2 CKD (HCC) Currently well controlled by lifestyle modification and slow weight loss with A1Cs borderline Continue diet and exercise.  Perform daily foot/skin check, notify office of any concerning changes foot exam; reminded to get annual diabetic eye exam -     Hemoglobin A1c -     Microalbumin / creatinine urine ratio  Mixed hyperlipidemia Currently well managed by lifestyle modification; continue to encourage low cholesterol diet and exercise. Discussed benefits of statin therapy, goal LDL <70 for daibetic -     Lipid panel -     TSH  Vitamin D deficiency Continue supplementation  -     VITAMIN D 25 Hydroxy (Vit-D Deficiency, Fractures)  Morbid obesity ([place]) Long discussion about weight loss, diet, and exercise Recommended diet heavy in fruits and veggies and low in animal meats, cheeses, and dairy products, appropriate calorie intake Discussed appropriate weight for height  Encouraged patient to restart exercise program Follow up at next visit  Medication management -     CBC with Differential/Platelet -     CMP/GFR  Screening for cardiovascular condition   Screening for hematuria or proteinuria -     Urinalysis, Complete (81001) -     Microalbumin / creatinine urine ratio   Discussed med's effects and SE's. Screening labs and tests as requested with regular follow-up as recommended. Over 40 minutes of exam, counseling, chart review, and complex, high  level critical decision making was performed this visit.   Future Appointments  Date Time Provider [place]  01/05/2018  3:00 PM Liane Comber, NP [facility] None  10/14/2018  9:00 AM [name], PA-C [facility] None     HPI  67 y.o. AA female, divorced/widowed, retired but still occasionally substitute teaches, very active in church -  presents for a complete physical and follow up. She has Essential hypertension; Hyperlipidemia associated with type 2 diabetes mellitus ([place]); Vitamin D deficiency; Type 2 diabetes with stage 2 chronic kidney disease GFR 60-89 ([place]); Morbid obesity ([place]); Medication management; and CKD stage 2 due to type 2 diabetes mellitus ([place]) on their problem list.   She has not complaints today ***  BMI is There is no height or weight on file to calculate BMI., she has not been working on diet but not exercise with slow weight loss Wt Readings from Last 3 Encounters:  10/03/17 199 lb (90.3 kg)  06/26/17 196 lb 12.8 oz (89.3 kg)  12/23/16 197 lb (89.4 kg)   Her blood pressure has been controlled at home, today their BP is   She does not workout. She denies chest pain, shortness of breath, dizziness.   She is not on cholesterol medication, currently managed by lifestyle changes. Her cholesterol is at goal. The cholesterol last visit was:   Lab Results  Component Value Date   CHOL 136 10/03/2017   HDL 46 (L) 10/03/2017   LDLCALC 76 10/03/2017   TRIG 55 10/03/2017   CHOLHDL 3.0 10/03/2017  She has been working on diet and exercise for T2DM, she is on bASA, she is not *** on ACE/ARB and denies increased appetite, nausea, paresthesia of the feet, polydipsia, polyuria, visual disturbances and vomiting. Last A1C in the office was:  Lab Results  Component Value Date   HGBA1C 6.5 (H) 10/03/2017   Last GFR: Lab Results  Component Value Date   GFRNONAA 69 10/03/2017   Patient is on Vitamin D supplement but remained below goal at the last check:     Lab Results  Component Value Date   VD25OH 49 06/26/2017      Current Medications:  Current Outpatient Medications on File Prior to Visit  Medication Sig Dispense Refill  . amLODipine (NORVASC) 10 MG tablet TAKE 1 TABLET BY MOUTH EVERY DAY (TAKE 1/2 TO 1 TABLET DEPENDING ON BLOOD PRESSURE READING) 90 tablet 1  . aspirin 81 MG tablet Take 81 mg by mouth daily.    . Cholecalciferol (VITAMIN D3) 1000 units CAPS Take 1 cap daily 60 capsule   . hydrochlorothiazide (HYDRODIURIL) 25 MG tablet TAKE 1 TABLET EACH MORNING FOR BP & FLUID & SWELLING 90 tablet 1  . Magnesium 250 MG TABS Take by mouth daily.     No current facility-administered medications on file prior to visit.    Allergies:  Allergies  Allergen Reactions  . Ziac [Bisoprolol-Hydrochlorothiazide]     Flushing    Medical History:  She has Essential hypertension; Hyperlipidemia associated with type 2 diabetes mellitus ([place]); Vitamin D deficiency; Type 2 diabetes with stage 2 chronic kidney disease GFR 60-89 ([place]); Morbid obesity ([place]); Medication management; and CKD stage 2 due to type 2 diabetes mellitus ([place]) on their problem list. Health Maintenance:   Immunization History  Administered Date(s) Administered  . PPD Test 04/26/2013   Preventative care: Last colonoscopy: 09/2014 EGD: 09/2014 Dr. Collene Mares Last mammogram: 01/2017 Last pap smear/pelvic exam: 2016 declines another DEXA: 2013  Prior vaccinations: TD or Tdap: 2009         Influenza: declines Pneumococcal: declines *** Prevnar13: declines Shingles/Zostavax: declines   Names of Other Physician/Practitioners you currently use: 1. [facility] here for primary care 2. [facility] doctor, last visit 02/2016 wears reading glasses- need follow up *** diabetic eye  3. Dr. Evette Doffing, dentist, last visit 06/2016  Patient Care Team: Unk Pinto, MD as PCP - General (Internal Medicine) Juanita Craver, MD as Consulting Physician  (Gastroenterology)  Surgical History:  She has a past surgical history that includes Cesarean section (1971); Knee arthroscopy (Right); Carpal tunnel release (Right); and Tubal ligation. Family History:  Herfamily history includes Aneurysm in her sister; Diabetes in her brother and brother; Heart disease in her brother and brother; Hypertension in her sister; Stroke in her mother; Thyroid disease in her sister. Social History:  She reports that she has never smoked. She has never used smokeless tobacco. She reports current alcohol use. She reports that she does not use drugs.  Review of Systems: Review of Systems  Constitutional: Negative for malaise/fatigue and weight loss.  HENT: Negative for hearing loss and tinnitus.   Eyes: Negative for blurred vision and double vision.  Respiratory: Negative for cough, shortness of breath and wheezing.   Cardiovascular: Negative for chest pain, palpitations, orthopnea, claudication and leg swelling.  Gastrointestinal: Negative for abdominal pain, blood in stool, constipation, diarrhea, heartburn, melena, nausea and vomiting.  Genitourinary: Negative.   Musculoskeletal: Negative for joint pain and myalgias.  Skin: Negative for rash.  Neurological:  Negative for dizziness, tingling, sensory change, weakness and headaches.  Endo/Heme/Allergies: Negative for polydipsia.  Psychiatric/Behavioral: Negative.  Negative for depression. The patient is not nervous/anxious and does not have insomnia.   All other systems reviewed and are negative.   Physical Exam: Estimated body mass index is 38.86 kg/m as calculated from the following:   Height as of 10/03/17: 5' (1.524 m).   Weight as of 10/03/17: 199 lb (90.3 kg). There were no vitals taken for this visit. General Appearance: Well nourished, in no apparent distress.  Eyes: PERRLA, EOMs, conjunctiva no swelling or erythema, normal fundi and vessels.  Sinuses: No Frontal/maxillary tenderness  ENT/Mouth: Ext  aud canals clear, normal light reflex with TMs without erythema, bulging. Good dentition. No erythema, swelling, or exudate on post pharynx. Tonsils not swollen or erythematous. Hearing normal.  Neck: Supple, thyroid normal. No bruits  Respiratory: Respiratory effort normal, BS equal bilaterally without rales, rhonchi, wheezing or stridor.  Cardio: RRR without murmurs, rubs or gallops. Brisk peripheral pulses without edema.  Chest: symmetric, with normal excursions and percussion.  Breasts: Symmetric, without lumps, nipple discharge, retractions.  Abdomen: Soft, nontender, no guarding, rebound, hernias, masses, or organomegaly.  Lymphatics: Non tender without lymphadenopathy.  Genitourinary: Defer Musculoskeletal: Full ROM all peripheral extremities,5/5 strength, and normal gait.  Skin: Warm, dry without rashes, lesions, ecchymosis. Neuro: Cranial nerves intact, reflexes equal bilaterally. Normal muscle tone, no cerebellar symptoms. Sensation intact.  Psych: Awake and oriented X 3, normal affect, Insight and Judgment appropriate.   EKG: WNL no ST changes.  Gorden Harms Melissa Tomaselli 10:52 AM [facility]

## 2018-01-05 ENCOUNTER — Encounter: Payer: Self-pay | Admitting: Adult Health

## 2018-01-15 ENCOUNTER — Encounter: Payer: Self-pay | Admitting: Adult Health

## 2018-01-15 ENCOUNTER — Ambulatory Visit (INDEPENDENT_AMBULATORY_CARE_PROVIDER_SITE_OTHER): Payer: Medicare Other | Admitting: Adult Health

## 2018-01-15 VITALS — BP 120/70 | HR 84 | Temp 97.3°F | Ht 59.75 in | Wt 198.0 lb

## 2018-01-15 DIAGNOSIS — Z136 Encounter for screening for cardiovascular disorders: Secondary | ICD-10-CM

## 2018-01-15 DIAGNOSIS — Z1211 Encounter for screening for malignant neoplasm of colon: Secondary | ICD-10-CM

## 2018-01-15 DIAGNOSIS — E785 Hyperlipidemia, unspecified: Secondary | ICD-10-CM

## 2018-01-15 DIAGNOSIS — M25551 Pain in right hip: Secondary | ICD-10-CM

## 2018-01-15 DIAGNOSIS — Z0001 Encounter for general adult medical examination with abnormal findings: Secondary | ICD-10-CM

## 2018-01-15 DIAGNOSIS — N182 Chronic kidney disease, stage 2 (mild): Secondary | ICD-10-CM

## 2018-01-15 DIAGNOSIS — Z0000 Encounter for general adult medical examination without abnormal findings: Secondary | ICD-10-CM

## 2018-01-15 DIAGNOSIS — I10 Essential (primary) hypertension: Secondary | ICD-10-CM | POA: Diagnosis not present

## 2018-01-15 DIAGNOSIS — Z79899 Other long term (current) drug therapy: Secondary | ICD-10-CM

## 2018-01-15 DIAGNOSIS — E559 Vitamin D deficiency, unspecified: Secondary | ICD-10-CM

## 2018-01-15 DIAGNOSIS — E1169 Type 2 diabetes mellitus with other specified complication: Secondary | ICD-10-CM

## 2018-01-15 DIAGNOSIS — E1122 Type 2 diabetes mellitus with diabetic chronic kidney disease: Secondary | ICD-10-CM

## 2018-01-15 MED ORDER — MELOXICAM 15 MG PO TABS: ORAL_TABLET | ORAL | 1 refills | Status: DC

## 2018-01-15 MED ORDER — PREDNISONE 20 MG PO TABS: ORAL_TABLET | ORAL | 0 refills | Status: DC

## 2018-01-15 NOTE — Patient Instructions (Addendum)
Brittney Gray , Thank you for taking time to come for your Medicare Wellness Visit. I appreciate your ongoing commitment to your health goals. Please review the following plan we discussed and let me know if I can assist you in the future.   These are the goals we discussed: Goals    . DIET - INCREASE WATER INTAKE    . Exercise 150 min/wk Moderate Activity    . Weight (lb) < 190 lb (86.2 kg)       This is a list of the screening recommended for you and due dates:  Health Maintenance  Topic Date Due  . Pneumonia vaccines (1 of 2 - PCV13) 09/27/2014  . Eye exam for diabetics  08/28/2017  . Tetanus Vaccine  11/18/2017  . Urine Protein Check  12/23/2017  . Hemoglobin A1C  04/03/2018  . Complete foot exam   10/04/2018  . Mammogram  01/22/2019  . Colon Cancer Screening  09/18/2024  . DEXA scan (bone density measurement)  Completed  .  Hepatitis C: One time screening is recommended by Center for Disease Control  (CDC) for  adults born from [birth year] through 1966.   Completed  . Flu Shot  Discontinued    Aim for 7+ servings of fruits and vegetables daily  65-80+ fluid ounces of water or unsweet tea for healthy kidneys  Limit to max 1 drink of alcohol per day; avoid smoking/tobacco  Limit animal fats in diet for cholesterol and heart health - choose grass fed whenever available  Avoid highly processed foods, and foods high in saturated/trans fats  Aim for low stress - take time to unwind and care for your mental health  Aim for 150 min of moderate intensity exercise weekly for heart health, and weights twice weekly for bone health  Aim for 7-9 hours of sleep daily      Hip Pain  The hip is the joint between the upper legs and the lower pelvis. The bones, cartilage, tendons, and muscles of your hip joint support your body and allow you to move around. Hip pain can range from a minor ache to severe pain in one or both of your hips. The pain may be felt on the inside of the hip joint  near the groin, or the outside near the buttocks and upper thigh. You may also have swelling or stiffness. Follow these instructions at home: Managing pain, stiffness, and swelling  If directed, apply ice to the injured area. ? Put ice in a plastic bag. ? Place a towel between your skin and the bag. ? Leave the ice on for 20 minutes, 2-3 times a day  Sleep with a pillow between your legs on your most comfortable side.  Avoid any activities that cause pain. General instructions  Take over-the-counter and prescription medicines only as told by your health care provider.  Do any exercises as told by your health care provider.  Record the following: ? How often you have hip pain. ? The location of your pain. ? What the pain feels like. ? What makes the pain worse.  Keep all follow-up visits as told by your health care provider. This is important. Contact a health care provider if:  You cannot put weight on your leg.  Your pain or swelling continues or gets worse after one week.  It gets harder to walk.  You have a fever. Get help right away if:  You fall.  You have a sudden increase in pain and swelling in  your hip.  Your hip is red or swollen or very tender to touch. Summary  Hip pain can range from a minor ache to severe pain in one or both of your hips.  The pain may be felt on the inside of the hip joint near the groin, or the outside near the buttocks and upper thigh.  Avoid any activities that cause pain.  Record how often you have hip pain, the location of the pain, what makes it worse and what it feels like. This information is not intended to replace advice given to you by your health care provider. Make sure you discuss any questions you have with your health care provider. Document Released: 06/13/2009 Document Revised: 11/27/2015 Document Reviewed: 11/27/2015 Elsevier Interactive Patient Education  2019 Elsevier Inc.   Meloxicam tablets What is this  medicine? MELOXICAM (mel OX i cam) is a non-steroidal anti-inflammatory drug (NSAID). It is used to reduce swelling and to treat pain. It may be used for osteoarthritis, rheumatoid arthritis, or juvenile rheumatoid arthritis. This medicine may be used for other purposes; ask your health care provider or pharmacist if you have questions. COMMON BRAND NAME(S): Mobic What should I tell my health care provider before I take this medicine? They need to know if you have any of these conditions: -bleeding disorders -cigarette smoker -coronary artery bypass graft (CABG) surgery within the past 2 weeks -drink more than 3 alcohol-containing drinks per day -heart disease -high blood pressure -history of stomach bleeding -kidney disease -liver disease -lung or breathing disease, like asthma -stomach or intestine problems -an unusual or allergic reaction to meloxicam, aspirin, other NSAIDs, other medicines, foods, dyes, or preservatives -pregnant or trying to get pregnant -breast-feeding How should I use this medicine? Take this medicine by mouth with a full glass of water. Follow the directions on the prescription label. You can take it with or without food. If it upsets your stomach, take it with food. Take your medicine at regular intervals. Do not take it more often than directed. Do not stop taking except on your doctor's advice. A special MedGuide will be given to you by the pharmacist with each prescription and refill. Be sure to read this information carefully each time. Talk to your pediatrician regarding the use of this medicine in children. While this drug may be prescribed for selected conditions, precautions do apply. Patients over 54 years old may have a stronger reaction and need a smaller dose. Overdosage: If you think you have taken too much of this medicine contact a poison control center or emergency room at once. NOTE: This medicine is only for you. Do not share this medicine with  others. What if I miss a dose? If you miss a dose, take it as soon as you can. If it is almost time for your next dose, take only that dose. Do not take double or extra doses. What may interact with this medicine? Do not take this medicine with any of the following medications: -cidofovir -ketorolac This medicine may also interact with the following medications: -aspirin and aspirin-like medicines -certain medicines for blood pressure, heart disease, irregular heart beat -certain medicines for depression, anxiety, or psychotic disturbances -certain medicines that treat or prevent blood clots like warfarin, enoxaparin, dalteparin, apixaban, dabigatran, rivaroxaban -cyclosporine -diuretics -methotrexate -other NSAIDs, medicines for pain and inflammation, like ibuprofen and naproxen -pemetrexed This list may not describe all possible interactions. Give your health care provider a list of all the medicines, herbs, non-prescription drugs, or dietary supplements  you use. Also tell them if you smoke, drink alcohol, or use illegal drugs. Some items may interact with your medicine. What should I watch for while using this medicine? Tell your doctor or healthcare professional if your symptoms do not start to get better or if they get worse. Do not take other medicines that contain aspirin, ibuprofen, or naproxen with this medicine. Side effects such as stomach upset, nausea, or ulcers may be more likely to occur. Many medicines available without a prescription should not be taken with this medicine. This medicine can cause ulcers and bleeding in the stomach and intestines at any time during treatment. This can happen with no warning and may cause death. There is increased risk with taking this medicine for a long time. Smoking, drinking alcohol, older age, and poor health can also increase risks. Call your doctor right away if you have stomach pain or blood in your vomit or stool. This medicine does not  prevent heart attack or stroke. In fact, this medicine may increase the chance of a heart attack or stroke. The chance may increase with longer use of this medicine and in people who have heart disease. If you take aspirin to prevent heart attack or stroke, talk with your doctor or health care professional. What side effects may I notice from receiving this medicine? Side effects that you should report to your doctor or health care professional as soon as possible: -allergic reactions like skin rash, itching or hives, swelling of the face, lips, or tongue -nausea, vomiting -signs and symptoms of a blood clot such as breathing problems; changes in vision; chest pain; severe, sudden headache; pain, swelling, warmth in the leg; trouble speaking; sudden numbness or weakness of the face, arm, or leg -signs and symptoms of bleeding such as bloody or black, tarry stools; red or dark-brown urine; spitting up blood or brown material that looks like coffee grounds; red spots on the skin; unusual bruising or bleeding from the eye, gums, or nose -signs and symptoms of liver injury like dark yellow or brown urine; general ill feeling or flu-like symptoms; light-colored stools; loss of appetite; nausea; right upper belly pain; unusually weak or tired; yellowing of the eyes or skin -signs and symptoms of stroke like changes in vision; confusion; trouble speaking or understanding; severe headaches; sudden numbness or weakness of the face, arm, or leg; trouble walking; dizziness; loss of balance or coordination Side effects that usually do not require medical attention (report to your doctor or health care professional if they continue or are bothersome): -constipation -diarrhea -gas This list may not describe all possible side effects. Call your doctor for medical advice about side effects. You may report side effects to FDA at 1-800-FDA-1088. Where should I keep my medicine? Keep out of the reach of children. Store  at room temperature between 15 and 30 degrees C (59 and 86 degrees F). Throw away any unused medicine after the expiration date. NOTE: This sheet is a summary. It may not cover all possible information. If you have questions about this medicine, talk to your doctor, pharmacist, or health care provider.  2019 Elsevier/Gold Standard (2017-04-25 11:22:56)

## 2018-01-15 NOTE — Progress Notes (Signed)
Complete Physical  Assessment and Plan:  Diagnoses and all orders for this visit:  Encounter for routine adult physical exam with abnormal findings  Essential hypertension Currently well controlled; continue medications Monitor blood pressure at home; call if consistently over 130/80 Continue DASH diet.   Reminder to go to the ER if any CP, SOB, nausea, dizziness, severe HA, changes vision/speech, left arm numbness and tingling and jaw pain. -     Magnesium       -     EKG 12-Lead  Diabetes mellitus with stage 2 CKD (HCC) Currently well controlled by lifestyle modification and slow weight loss with A1Cs borderline Continue diet and exercise.  Perform daily foot/skin check, notify office of any concerning changes foot exam; reminded to get annual diabetic eye exam BORDERLINE - she presfers to defer staring ACE/ARB etc, wants to work on lifestyle DUE for eye exam - she will schedule and forward results -     Hemoglobin A1c -     Microalbumin / creatinine urine ratio  Mixed hyperlipidemia Currently well managed by lifestyle modification; continue to encourage low cholesterol diet and exercise. Discussed benefits of statin therapy, goal LDL <70 for daibetic -     Lipid panel -     TSH  Vitamin D deficiency Continue supplementation  -     VITAMIN D 25 Hydroxy (Vit-D Deficiency, Fractures)  Morbid obesity ([place]) Long discussion about weight loss, diet, and exercise Recommended diet heavy in fruits and veggies and low in animal meats, cheeses, and dairy products, appropriate calorie intake Discussed appropriate weight for height  Encouraged patient to restart exercise program Follow up at next visit  Medication management -     CBC with Differential/Platelet -     CMP/GFR  Screening for hematuria or proteinuria -     Urinalysis, Complete (81001) -     Microalbumin / creatinine urine ratio  R hip pain Prednisone was not prescribed,NSAIDs, RICE, and exercise given If not  better follow up in office or will refer to PT/orthopedics. Natural history and expected course discussed. Questions answered. Neurosurgeon distributed. Ice to affected area as needed for local pain relief. NSAIDs per medication orders.    Discussed med's effects and SE's. Screening labs and tests as requested with regular follow-up as recommended. Over 40 minutes of exam, counseling, chart review, and complex, high level critical decision making was performed this visit.   Future Appointments  Date Time Provider [place]  10/14/2018  9:00 AM [name], PA-C [facility] None     HPI  68 y.o. AA female, presents for a complete physical and follow up. She has Essential hypertension; Hyperlipidemia associated with type 2 diabetes mellitus ([place]); Vitamin D deficiency; Type 2 diabetes with stage 2 chronic kidney disease GFR 60-89 ([place]); Morbid obesity ([place]); Medication management; and CKD stage 2 due to type 2 diabetes mellitus ([place]) on their problem list.   She has persistent but intermittent R hip pain; she has tried aleve which is helpful but taking inconsistently. Applying ice has been helpful, heat didn't help.    divorced/widowed, 41 son, 39 year old granddaughter, retired but still occasionally substitute teaches, very active in church -   BMI is Body mass index is 38.99 kg/m., she has not been working on diet but not exercise with slow weight loss, she admits very poor eating habits, frequently doesn't eat until dinner, might eat crackers during the day.  Wt Readings from Last 3 Encounters:  01/15/18 198 lb (89.8  kg)  10/03/17 199 lb (90.3 kg)  06/26/17 196 lb 12.8 oz (89.3 kg)   Her blood pressure has been controlled at home, today their BP is BP: 120/70 She does not workout. She denies chest pain, shortness of breath, dizziness.   She is not on cholesterol medication, currently managed by lifestyle changes. Her cholesterol is at goal. The cholesterol last  visit was:   Lab Results  Component Value Date   CHOL 136 10/03/2017   HDL 46 (L) 10/03/2017   LDLCALC 76 10/03/2017   TRIG 55 10/03/2017   CHOLHDL 3.0 10/03/2017   She has been working on diet and exercise for T2DM currently managed by lifestyle modification, she is on bASA, she is not on ACE/ARB and denies increased appetite, nausea, paresthesia of the feet, polydipsia, polyuria, visual disturbances and vomiting. Last A1C in the office was:  Lab Results  Component Value Date   HGBA1C 6.5 (H) 10/03/2017   Last GFR: Lab Results  Component Value Date   GFRNONAA 69 10/03/2017   Patient is on Vitamin D supplement but remained below goal at the last check:    Lab Results  Component Value Date   VD25OH 49 06/26/2017      Current Medications:  Current Outpatient Medications on File Prior to Visit  Medication Sig Dispense Refill  . amLODipine (NORVASC) 10 MG tablet TAKE 1 TABLET BY MOUTH EVERY DAY (TAKE 1/2 TO 1 TABLET DEPENDING ON BLOOD PRESSURE READING) 90 tablet 1  . aspirin 81 MG tablet Take 81 mg by mouth daily.    . Cholecalciferol (VITAMIN D3) 1000 units CAPS Take 1 cap daily 60 capsule   . hydrochlorothiazide (HYDRODIURIL) 25 MG tablet TAKE 1 TABLET EACH MORNING FOR BP & FLUID & SWELLING 90 tablet 1  . Magnesium 250 MG TABS Take by mouth daily.     No current facility-administered medications on file prior to visit.    Allergies:  Allergies  Allergen Reactions  . Ziac [Bisoprolol-Hydrochlorothiazide]     Flushing    Medical History:  She has Essential hypertension; Hyperlipidemia associated with type 2 diabetes mellitus ([place]); Vitamin D deficiency; Type 2 diabetes with stage 2 chronic kidney disease GFR 60-89 ([place]); Morbid obesity ([place]); Medication management; and CKD stage 2 due to type 2 diabetes mellitus ([place]) on their problem list. Health Maintenance:   Immunization History  Administered Date(s) Administered  . PPD Test 04/26/2013   Preventative care: Last  colonoscopy: 09/2014 EGD: 09/2014 Dr. Collene Mares Last mammogram: 01/2017, will schedule Last pap smear/pelvic exam: 2016 declines another DEXA: 2013  Prior vaccinations: TD or Tdap: 2009, declines at this        Influenza: declines Pneumococcal: declines Prevnar13: declines Shingles/Zostavax: declines   Names of Other Physician/Practitioners you currently use: 1. [facility] here for primary care 2. Dr. Truman Hayward, eye doctor, last visit 02/2016 wears reading glasses- need follow up   3. Dr. Sallee Lange, dentist, last visit 2019, q50m  Patient Care Team: Unk Pinto, MD as PCP - General (Internal Medicine) Juanita Craver, MD as Consulting Physician (Gastroenterology)  Surgical History:  She has a past surgical history that includes Cesarean section (1971); Knee arthroscopy (Right); Carpal tunnel release (Right); and Tubal ligation. Family History:  Herfamily history includes Aneurysm in her sister; Diabetes in her brother and brother; Heart disease in her brother and brother; Hypertension in her sister; Stroke in her mother; Thyroid disease in her sister. Social History:  She reports that she has never smoked.  She has never used smokeless tobacco. She reports current alcohol use. She reports that she does not use drugs.  Review of Systems: Review of Systems  Constitutional: Negative for malaise/fatigue and weight loss.  HENT: Negative for hearing loss and tinnitus.   Eyes: Negative for blurred vision and double vision.  Respiratory: Negative for cough, shortness of breath and wheezing.   Cardiovascular: Negative for chest pain, palpitations, orthopnea, claudication and leg swelling.  Gastrointestinal: Negative for abdominal pain, blood in stool, constipation, diarrhea, heartburn, melena, nausea and vomiting.  Genitourinary: Negative.   Musculoskeletal: Positive for joint pain (R hip pain x 2-3 months). Negative for myalgias.  Skin: Negative for  rash.  Neurological: Negative for dizziness, tingling, sensory change, weakness and headaches.  Endo/Heme/Allergies: Negative for polydipsia.  Psychiatric/Behavioral: Negative.  Negative for depression. The patient is not nervous/anxious and does not have insomnia.   All other systems reviewed and are negative.   Physical Exam: Estimated body mass index is 38.99 kg/m as calculated from the following:   Height as of this encounter: 4' 11.75" (1.518 m).   Weight as of this encounter: 198 lb (89.8 kg). BP 120/70   Pulse 84   Temp (!) 97.3 F (36.3 C)   Ht 4' 11.75" (1.518 m)   Wt 198 lb (89.8 kg)   SpO2 99%   BMI 38.99 kg/m  General Appearance: Well nourished, in no apparent distress.  Eyes: PERRLA, EOMs, conjunctiva no swelling or erythema, normal fundi and vessels.  Sinuses: No Frontal/maxillary tenderness  ENT/Mouth: Ext aud canals clear, normal light reflex with TMs without erythema, bulging. Good dentition. No erythema, swelling, or exudate on post pharynx. Tonsils not swollen or erythematous. Hearing normal.  Neck: Supple, thyroid normal. No bruits  Respiratory: Respiratory effort normal, BS equal bilaterally without rales, rhonchi, wheezing or stridor.  Cardio: RRR without murmurs, rubs or gallops. Brisk peripheral pulses without edema.  Chest: symmetric, with normal excursions and percussion.  Breasts: Symmetric, without lumps, nipple discharge, retractions.  Abdomen: Soft, nontender, no guarding, rebound, hernias, masses, or organomegaly.  Lymphatics: Non tender without lymphadenopathy.  Genitourinary: Defer Musculoskeletal: Full ROM all peripheral extremities, 5/5 strength, and normal gait. No R trochanter tenderness, SI joint tenderness, neg straight leg raise.  Skin: Warm, dry without rashes, lesions, ecchymosis. Neuro: Cranial nerves intact, reflexes equal bilaterally. Normal muscle tone, no cerebellar symptoms. Sensation intact.  Psych: Awake and oriented X 3, normal  affect, Insight and Judgment appropriate.   EKG: WNL no ST changes.  Gorden Harms Brevin Mcfadden 4:20 PM [facility]

## 2018-01-16 ENCOUNTER — Other Ambulatory Visit: Payer: Self-pay | Admitting: Adult Health

## 2018-01-16 LAB — LIPID PANEL
CHOLESTEROL: 173 mg/dL (ref <200)
HDL: 50 mg/dL — ABNORMAL LOW (ref >50)
LDL CHOLESTEROL (CALC): 104 mg/dL — AB
Non-HDL Cholesterol (Calc): 123 mg/dL (calc) (ref <130)
TRIGLYCERIDES: 99 mg/dL (ref <150)
Total CHOL/HDL Ratio: 3.5 (calc) (ref <5.0)

## 2018-01-16 LAB — CBC WITH DIFFERENTIAL/PLATELET
ABSOLUTE MONOCYTES: 765 {cells}/uL (ref 200–950)
Basophils Absolute: 68 cells/uL (ref 0–200)
Basophils Relative: 0.8 %
EOS ABS: 238 {cells}/uL (ref 15–500)
Eosinophils Relative: 2.8 %
HCT: 42.2 % (ref 35.0–45.0)
Hemoglobin: 14.5 g/dL (ref 11.7–15.5)
Lymphs Abs: 2873 cells/uL (ref 850–3900)
MCH: 28.8 pg (ref 27.0–33.0)
MCHC: 34.4 g/dL (ref 32.0–36.0)
MCV: 83.9 fL (ref 80.0–100.0)
MONOS PCT: 9 %
MPV: 10.7 fL (ref 7.5–12.5)
Neutro Abs: 4556 cells/uL (ref 1500–7800)
Neutrophils Relative %: 53.6 %
PLATELETS: 300 10*3/uL (ref 140–400)
RBC: 5.03 10*6/uL (ref 3.80–5.10)
RDW: 13.5 % (ref 11.0–15.0)
TOTAL LYMPHOCYTE: 33.8 %
WBC: 8.5 10*3/uL (ref 3.8–10.8)

## 2018-01-16 LAB — COMPLETE METABOLIC PANEL WITH GFR
AG Ratio: 1.4 (calc) (ref 1.0–2.5)
ALKALINE PHOSPHATASE (APISO): 55 U/L (ref 33–130)
ALT: 3 U/L — AB (ref 6–29)
AST: 15 U/L (ref 10–35)
Albumin: 4.3 g/dL (ref 3.6–5.1)
BILIRUBIN TOTAL: 0.5 mg/dL (ref 0.2–1.2)
BUN: 16 mg/dL (ref 7–25)
CHLORIDE: 101 mmol/L (ref 98–110)
CO2: 32 mmol/L (ref 20–32)
CREATININE: 0.93 mg/dL (ref 0.50–0.99)
Calcium: 9.9 mg/dL (ref 8.6–10.4)
GFR, Est African American: 73 mL/min/{1.73_m2} (ref >=60)
GFR, Est Non African American: 63 mL/min/{1.73_m2} (ref >=60)
Globulin: 3.1 g/dL (calc) (ref 1.9–3.7)
Glucose, Bld: 88 mg/dL (ref 65–99)
Potassium: 4.3 mmol/L (ref 3.5–5.3)
Sodium: 141 mmol/L (ref 135–146)
Total Protein: 7.4 g/dL (ref 6.1–8.1)

## 2018-01-16 LAB — MAGNESIUM: MAGNESIUM: 1.9 mg/dL (ref 1.5–2.5)

## 2018-01-16 LAB — TSH: TSH: 1.9 m[IU]/L (ref 0.40–4.50)

## 2018-01-16 LAB — URINALYSIS, ROUTINE W REFLEX MICROSCOPIC
BILIRUBIN URINE: NEGATIVE
GLUCOSE, UA: NEGATIVE
Hgb urine dipstick: NEGATIVE
KETONES UR: NEGATIVE
Leukocytes, UA: NEGATIVE
Nitrite: NEGATIVE
Protein, ur: NEGATIVE
SPECIFIC GRAVITY, URINE: 1.019 (ref 1.001–1.03)
pH: 6 (ref 5.0–8.0)

## 2018-01-16 LAB — HEMOGLOBIN A1C
EAG (MMOL/L): 7.7 (calc)
Hgb A1c MFr Bld: 6.5 % of total Hgb — ABNORMAL HIGH (ref <5.7)
Mean Plasma Glucose: 140 (calc)

## 2018-01-16 LAB — VITAMIN D 25 HYDROXY (VIT D DEFICIENCY, FRACTURES): VIT D 25 HYDROXY: 40 ng/mL (ref 30–100)

## 2018-01-16 LAB — MICROALBUMIN / CREATININE URINE RATIO
CREATININE, URINE: 115 mg/dL (ref 20–275)
MICROALB/CREAT RATIO: 3 ug/mg{creat} (ref <30)
Microalb, Ur: 0.4 mg/dL

## 2018-01-16 MED ORDER — VITAMIN D3 25 MCG (1000 UT) PO CAPS: ORAL_CAPSULE | ORAL | Status: DC

## 2018-02-24 ENCOUNTER — Other Ambulatory Visit: Payer: Self-pay | Admitting: Adult Health

## 2018-02-24 DIAGNOSIS — R609 Edema, unspecified: Secondary | ICD-10-CM

## 2018-02-24 DIAGNOSIS — I10 Essential (primary) hypertension: Secondary | ICD-10-CM

## 2018-05-03 ENCOUNTER — Encounter: Payer: Self-pay | Admitting: Internal Medicine

## 2018-05-03 NOTE — Patient Instructions (Signed)

## 2018-05-03 NOTE — Progress Notes (Signed)
History of Present Illness:      This very nice 68 y.o. DBF presents for 3 month follow up with HTN, HLD, Pre-Diabetes and Vitamin D Deficiency. Patient does c/o recent Lt 1st toe pain w/o hx of injury.       Patient is treated for HTN since 2002 & BP has been controlled at home. Today's BP: 120/78. Patient has had no complaints of any cardiac type chest pain, palpitations, dyspnea / orthopnea / PND, dizziness, claudication, or dependent edema.      Hyperlipidemia is controlled with diet & meds. Patient denies myalgias or other med SE's. Last Lipids were not at goal: Lab Results  Component Value Date   CHOL 173 01/15/2018   HDL 50 (L) 01/15/2018   LDLCALC 104 (H) 01/15/2018   TRIG 99 01/15/2018   CHOLHDL 3.5 01/15/2018       Also, the patient has  Morbid Obesity (BMI 38) and history of T2_NIDDM (2015) attempting dietary management. She has had no symptoms of reactive hypoglycemia, diabetic polys, paresthesias or visual blurring.  Last A1c was not at goal: Lab Results  Component Value Date   HGBA1C 6.5 (H) 01/15/2018       Further, the patient also has history of Vitamin D Deficiency ("32" / 2016)  and supplements vitamin D without any suspected side-effects. Last vitamin D was low and not at goal (70-100): Lab Results  Component Value Date   VD25OH 40 01/15/2018   Current Outpatient Medications on File Prior to Visit  Medication Sig  . amLODipine (NORVASC) 10 MG tablet TAKE 1 TABLET BY MOUTH EVERY DAY (TAKE 1/2 TO 1 TABLET DEPENDING ON BLOOD PRESSURE READING)  . aspirin 81 MG tablet Take 81 mg by mouth daily.  . Cholecalciferol (VITAMIN D3) 25 MCG (1000 UT) CAPS Take 2 cap daily  . hydrochlorothiazide (HYDRODIURIL) 25 MG tablet TAKE 1 TABLET EACH MORNING FOR BP & FLUID & SWELLING  . Magnesium 250 MG TABS Take by mouth daily.  . meloxicam (MOBIC) 15 MG tablet Take one daily with food for 2 weeks, can take with tylenol, can not take with aleve, iburpofen, then as needed daily for  pain (Patient not taking: Reported on 05/04/2018)   No current facility-administered medications on file prior to visit.    Allergies  Allergen Reactions  . Ziac [Bisoprolol-Hydrochlorothiazide]     Flushing    PMHx:   Past Medical History:  Diagnosis Date  . Hypertension   . Obesity   . Pre-diabetes   . Vitamin D deficiency    Immunization History  Administered Date(s) Administered  . Influenza, High Dose Seasonal PF 05/04/2018  . PPD Test 04/26/2013   Past Surgical History:  Procedure Laterality Date  . CARPAL TUNNEL RELEASE Right   . CESAREAN SECTION  1971  . KNEE ARTHROSCOPY Right   . TUBAL LIGATION     FHx:    Reviewed / unchanged  SHx:    Reviewed / unchanged   Systems Review:  Constitutional: Denies fever, chills, wt changes, headaches, insomnia, fatigue, night sweats, change in appetite. Eyes: Denies redness, blurred vision, diplopia, discharge, itchy, watery eyes.  ENT: Denies discharge, congestion, post nasal drip, epistaxis, sore throat, earache, hearing loss, dental pain, tinnitus, vertigo, sinus pain, snoring.  CV: Denies chest pain, palpitations, irregular heartbeat, syncope, dyspnea, diaphoresis, orthopnea, PND, claudication or edema. Respiratory: denies cough, dyspnea, DOE, pleurisy, hoarseness, laryngitis, wheezing.  Gastrointestinal: Denies dysphagia, odynophagia, heartburn, reflux, water brash, abdominal pain or cramps, nausea, vomiting, bloating, diarrhea,  constipation, hematemesis, melena, hematochezia  or hemorrhoids. Genitourinary: Denies dysuria, frequency, urgency, nocturia, hesitancy, discharge, hematuria or flank pain. Musculoskeletal: Denies arthralgias, myalgias, stiffness, jt. swelling, pain, limping or strain/sprain.  Skin: Denies pruritus, rash, hives, warts, acne, eczema or change in skin lesion(s). Neuro: No weakness, tremor, incoordination, spasms, paresthesia or pain. Psychiatric: Denies confusion, memory loss or sensory loss. Endo:  Denies change in weight, skin or hair change.  Heme/Lymph: No excessive bleeding, bruising or enlarged lymph nodes.  Physical Exam  BP 120/78   Pulse 72   Temp (!) 97 F (36.1 C)   Resp 16   Ht 4' 11.75" (1.518 m)   Wt 196 lb 6.4 oz (89.1 kg)   BMI 38.68 kg/m   Appears over nourished, well groomed  and in no distress.  Eyes: PERRLA, EOMs, conjunctiva no swelling or erythema. Sinuses: No frontal/maxillary tenderness ENT/Mouth: EAC's clear, TM's nl w/o erythema, bulging. Nares clear w/o erythema, swelling, exudates. Oropharynx clear without erythema or exudates. Oral hygiene is good. Tongue normal, non obstructing. Hearing intact.  Neck: Supple. Thyroid not palpable. Car 2+/2+ without bruits, nodes or JVD. Chest: Respirations nl with BS clear & equal w/o rales, rhonchi, wheezing or stridor.  Cor: Heart sounds normal w/ regular rate and rhythm without sig. murmurs, gallops, clicks or rubs. Peripheral pulses normal and equal  without edema.  Abdomen: Soft & bowel sounds normal. Non-tender w/o guarding, rebound, hernias, masses or organomegaly.  Lymphatics: Unremarkable.  Musculoskeletal: Full ROM all peripheral extremities, joint stability, 5/5 strength and normal gait. Slight tender over Left !st MTP jtSkin: Warm, dry without exposed rashes, lesions or ecchymosis apparent.  Neuro: Cranial nerves intact, reflexes equal bilaterally. Sensory-motor testing grossly intact. Tendon reflexes grossly intact.  Pysch: Alert & oriented x 3.  Insight and judgement nl & appropriate. No ideations.  Assessment and Plan:  1. Essential hypertension  - Continue medication, monitor blood pressure at home.  - Continue DASH diet.  Reminder to go to the ER if any CP,  SOB, nausea, dizziness, severe HA, changes vision/speech.  - CBC with Differential/Platelet - COMPLETE METABOLIC PANEL WITH GFR - Magnesium - TSH  2. Hyperlipidemia, mixed  - Continue diet/meds, exercise,& lifestyle modifications.  -  Continue monitor periodic cholesterol/liver & renal functions   - Lipid panel - TSH  3. Type 2 diabetes mellitus with stage 2 chronic kidney disease, without long-term current use of insulin (HCC)  - Continue diet, exercise  - Lifestyle modifications.  - Monitor appropriate labs.  - Hemoglobin A1c - Insulin, random  4. Vitamin D deficiency  - Continue supplementation.   - VITAMIN D 25 Hydroxyl 5. Medication management  - CBC with Differential/Platelet - COMPLETE METABOLIC PANEL WITH GFR - Magnesium - Lipid panel - TSH - Hemoglobin A1c - Insulin, random - VITAMIN D 25 Hydroxyl     Discussed  regular exercise, BP monitoring, weight control to achieve/maintain BMI less than 25 and discussed med and SE's. Recommended labs to assess and monitor clinical status with further disposition pending results of labs. I discussed the assessment and treatment plan with the patient. The patient was provided an opportunity to ask questions and all were answered. The patient agreed with the plan and demonstrated an understanding of the instructions. Over 32 minutes of exam, counseling, chart review was performed.      Kirtland Bouchard, MD

## 2018-05-04 ENCOUNTER — Ambulatory Visit (INDEPENDENT_AMBULATORY_CARE_PROVIDER_SITE_OTHER): Payer: Medicare Other | Admitting: Internal Medicine

## 2018-05-04 ENCOUNTER — Other Ambulatory Visit: Payer: Self-pay

## 2018-05-04 VITALS — BP 120/78 | HR 72 | Temp 97.0°F | Resp 16 | Ht 59.75 in | Wt 196.4 lb

## 2018-05-04 DIAGNOSIS — E559 Vitamin D deficiency, unspecified: Secondary | ICD-10-CM

## 2018-05-04 DIAGNOSIS — I10 Essential (primary) hypertension: Secondary | ICD-10-CM | POA: Diagnosis not present

## 2018-05-04 DIAGNOSIS — N182 Chronic kidney disease, stage 2 (mild): Secondary | ICD-10-CM

## 2018-05-04 DIAGNOSIS — Z79899 Other long term (current) drug therapy: Secondary | ICD-10-CM

## 2018-05-04 DIAGNOSIS — E1122 Type 2 diabetes mellitus with diabetic chronic kidney disease: Secondary | ICD-10-CM | POA: Diagnosis not present

## 2018-05-04 DIAGNOSIS — E782 Mixed hyperlipidemia: Secondary | ICD-10-CM | POA: Diagnosis not present

## 2018-05-04 DIAGNOSIS — Z23 Encounter for immunization: Secondary | ICD-10-CM

## 2018-05-05 ENCOUNTER — Other Ambulatory Visit: Payer: Self-pay | Admitting: Internal Medicine

## 2018-05-05 DIAGNOSIS — M109 Gout, unspecified: Secondary | ICD-10-CM

## 2018-05-05 LAB — CBC WITH DIFFERENTIAL/PLATELET
Absolute Monocytes: 572 cells/uL (ref 200–950)
Basophils Absolute: 52 cells/uL (ref 0–200)
Basophils Relative: 0.8 %
Eosinophils Absolute: 143 cells/uL (ref 15–500)
Eosinophils Relative: 2.2 %
HCT: 43 % (ref 35.0–45.0)
Hemoglobin: 14.6 g/dL (ref 11.7–15.5)
Lymphs Abs: 2080 cells/uL (ref 850–3900)
MCH: 28.5 pg (ref 27.0–33.0)
MCHC: 34 g/dL (ref 32.0–36.0)
MCV: 84 fL (ref 80.0–100.0)
MPV: 10.4 fL (ref 7.5–12.5)
Monocytes Relative: 8.8 %
Neutro Abs: 3653 cells/uL (ref 1500–7800)
Neutrophils Relative %: 56.2 %
Platelets: 275 10*3/uL (ref 140–400)
RBC: 5.12 10*6/uL — ABNORMAL HIGH (ref 3.80–5.10)
RDW: 13.4 % (ref 11.0–15.0)
Total Lymphocyte: 32 %
WBC: 6.5 10*3/uL (ref 3.8–10.8)

## 2018-05-05 LAB — HEMOGLOBIN A1C
Hgb A1c MFr Bld: 6.3 % of total Hgb — ABNORMAL HIGH (ref <5.7)
Mean Plasma Glucose: 134 (calc)
eAG (mmol/L): 7.4 (calc)

## 2018-05-05 LAB — LIPID PANEL
Cholesterol: 161 mg/dL (ref <200)
HDL: 50 mg/dL (ref >=50)
LDL Cholesterol (Calc): 94 mg/dL (calc)
Non-HDL Cholesterol (Calc): 111 mg/dL (calc) (ref <130)
Total CHOL/HDL Ratio: 3.2 (calc) (ref <5.0)
Triglycerides: 81 mg/dL (ref <150)

## 2018-05-05 LAB — COMPLETE METABOLIC PANEL WITH GFR
AG Ratio: 1.4 (calc) (ref 1.0–2.5)
ALT: 3 U/L — ABNORMAL LOW (ref 6–29)
AST: 12 U/L (ref 10–35)
Albumin: 4.2 g/dL (ref 3.6–5.1)
Alkaline phosphatase (APISO): 50 U/L (ref 37–153)
BUN: 18 mg/dL (ref 7–25)
CO2: 31 mmol/L (ref 20–32)
Calcium: 10.2 mg/dL (ref 8.6–10.4)
Chloride: 102 mmol/L (ref 98–110)
Creat: 0.86 mg/dL (ref 0.50–0.99)
GFR, Est African American: 80 mL/min/{1.73_m2} (ref >=60)
GFR, Est Non African American: 69 mL/min/{1.73_m2} (ref >=60)
Globulin: 3 g/dL (calc) (ref 1.9–3.7)
Glucose, Bld: 88 mg/dL (ref 65–99)
Potassium: 3.7 mmol/L (ref 3.5–5.3)
Sodium: 140 mmol/L (ref 135–146)
Total Bilirubin: 0.7 mg/dL (ref 0.2–1.2)
Total Protein: 7.2 g/dL (ref 6.1–8.1)

## 2018-05-05 LAB — TSH: TSH: 1.33 mIU/L (ref 0.40–4.50)

## 2018-05-05 LAB — URIC ACID: Uric Acid, Serum: 9.1 mg/dL — ABNORMAL HIGH (ref 2.5–7.0)

## 2018-05-05 LAB — VITAMIN D 25 HYDROXY (VIT D DEFICIENCY, FRACTURES): Vit D, 25-Hydroxy: 45 ng/mL (ref 30–100)

## 2018-05-05 LAB — INSULIN, RANDOM: Insulin: 12.7 u[IU]/mL

## 2018-05-05 LAB — MAGNESIUM: Magnesium: 1.8 mg/dL (ref 1.5–2.5)

## 2018-05-05 MED ORDER — PREDNISONE 20 MG PO TABS: ORAL_TABLET | ORAL | 0 refills | Status: DC

## 2018-05-05 MED ORDER — ALLOPURINOL 300 MG PO TABS: ORAL_TABLET | ORAL | 3 refills | Status: DC

## 2018-06-10 ENCOUNTER — Other Ambulatory Visit: Payer: Self-pay | Admitting: Internal Medicine

## 2018-06-10 ENCOUNTER — Telehealth: Payer: Self-pay | Admitting: *Deleted

## 2018-06-10 DIAGNOSIS — M25559 Pain in unspecified hip: Secondary | ICD-10-CM

## 2018-06-10 NOTE — Telephone Encounter (Signed)
Patient called and states she put hair removal cream on her face and has bumps.  Per Dr Melford Aase, the patient was advised she can use hydrocortisone 1% cream on her face apply 2 to 4 times a day.  Patient is aware.

## 2018-06-12 ENCOUNTER — Other Ambulatory Visit: Payer: Self-pay | Admitting: Internal Medicine

## 2018-06-22 ENCOUNTER — Encounter: Payer: Self-pay | Admitting: Physician Assistant

## 2018-06-22 ENCOUNTER — Other Ambulatory Visit: Payer: Self-pay

## 2018-06-22 ENCOUNTER — Ambulatory Visit: Payer: Self-pay

## 2018-06-22 ENCOUNTER — Ambulatory Visit (INDEPENDENT_AMBULATORY_CARE_PROVIDER_SITE_OTHER): Payer: Medicare Other | Admitting: Physician Assistant

## 2018-06-22 ENCOUNTER — Ambulatory Visit: Payer: Medicare Other

## 2018-06-22 DIAGNOSIS — M545 Low back pain: Secondary | ICD-10-CM

## 2018-06-22 DIAGNOSIS — M4807 Spinal stenosis, lumbosacral region: Secondary | ICD-10-CM

## 2018-06-22 DIAGNOSIS — M25551 Pain in right hip: Secondary | ICD-10-CM | POA: Diagnosis not present

## 2018-06-22 MED ORDER — CYCLOBENZAPRINE HCL 10 MG PO TABS: 10.0000 mg | ORAL_TABLET | Freq: Every day | ORAL | 0 refills | Status: DC

## 2018-06-22 NOTE — Progress Notes (Signed)
Office Visit Note   Patient: Brittney Gray           Date of Birth: 08-08-1950           MRN: [id number] Visit Date: 06/22/2018              Requested by: Unk Pinto, [place],   [id number] PCP: Unk Pinto, MD   Assessment & Plan: Visit Diagnoses:  1. Pain in right hip     Plan: We will have her undergo an MRI of the lumbar spine to rule out HNP as a source of her radicular symptoms both legs.  Follow-up after the MRI to go over results and discuss further treatment.  Exercises for her back was given and reviewed with her.  Handouts on these exercises were reviewed with patient.  Questions encouraged and answered.  Did place her on some Flexeril to take at night hopefully this will help with some of the spasms she has had  Follow-Up Instructions: Return After MRI.   Orders:  Orders Placed This Encounter  Procedures  . XR HIP UNILAT W OR W/O PELVIS 1V RIGHT  . XR Lumbar Spine 2-3 Views   Meds ordered this encounter  Medications  . cyclobenzaprine (FLEXERIL) 10 MG tablet    Sig: Take 1 tablet (10 mg total) by mouth at bedtime.    Dispense:  40 tablet    Refill:  0      Procedures: No procedures performed   Clinical Data: No additional findings.   Subjective: Chief Complaint  Patient presents with  . Right Hip - Pain  . Right Leg - Pain    HPI [name] comes in today due to right hip pain is been ongoing for the past year.  She states her pain will get better and then get worse.  She is having dull pain that is worse in the evening and at night goes down into her thigh and down into the lateral aspect of her right lower leg.  Does not the past ankle.  She does have occasional jolting pain down in the left calf.  Pain does not awaken her.  She has no problems donning shoes or socks.  She has had no bowel bladder dysfunction.  No saddle anesthesia like symptoms.  She has some low back pain.  States her pain is 4 out  of 10 pain worse.  She is taking some Aleve which is helped.  Primary care physician did give her prednisone Dosepak which helped while she was taking but her pain returned.  She is had no known injury. Review of Systems See HPI otherwise negative or noncontributory.  Objective: Vital Signs: There were no vitals taken for this visit.  Physical Exam Constitutional:      Appearance: She is not ill-appearing or diaphoretic.  Pulmonary:     Effort: Pulmonary effort is normal.  Neurological:     Mental Status: She is alert.  Psychiatric:        Mood and Affect: Mood normal.        Behavior: Behavior normal.     Ortho Exam Bilateral lower extremities 5 out of 5 strength throughout against resistance.  Negative straight leg raise bilaterally.  Good range of motion of both hips without pain.  Nontender over the trochanteric region of both hips.  Deep tendon reflexes are 2+ and equal and symmetric at the knees and ankles.  Sensation grossly intact bilateral feet to light touch.  Dorsal pedal pulses are 2+ and equal and symmetric bilaterally. Specialty Comments:  No specialty comments available.  Imaging: Xr Hip Unilat W Or W/o Pelvis 1v Right  Result Date: 06/22/2018 AP pelvis and lateral view of the right hip: Right hip is well located.  Bilateral hips appear well preserved.  No bony abnormalities no acute fractures.  Xr Lumbar Spine 2-3 Views  Result Date: 06/22/2018 AP lateral lumbar: Slight scoliosis.  No acute fractures.  Slight loss of lordotic curvature.  Endplate spurring at L1 4 5 and L5-S1.  No spondylolisthesis.    PMFS History: Patient Active Problem List   Diagnosis Date Noted  . CKD stage 2 due to type 2 diabetes mellitus ([place]) 12/30/2017  . Medication management 04/12/2015  . Morbid obesity ([place]) 09/16/2013  . Type 2 diabetes with stage 2 chronic kidney disease GFR 60-89 ([place]) 01/07/2013  . Essential hypertension 01/03/2013  . Hyperlipidemia associated with type 2  diabetes mellitus ([place]) 01/03/2013  . Vitamin D deficiency 01/03/2013   Past Medical History:  Diagnosis Date  . Hypertension   . Obesity   . Pre-diabetes   . Vitamin D deficiency     Family History  Problem Relation Age of Onset  . Stroke Mother   . Hypertension Sister   . Thyroid disease Sister   . Aneurysm Sister   . Heart disease Brother   . Diabetes Brother   . Heart disease Brother   . Diabetes Brother     Past Surgical History:  Procedure Laterality Date  . CARPAL TUNNEL RELEASE Right   . CESAREAN SECTION  1971  . KNEE ARTHROSCOPY Right   . TUBAL LIGATION     Social History   Occupational History  . Occupation: Oceanographer  . Occupation: Retired  Tobacco Use  . Smoking status: Never Smoker  . Smokeless tobacco: Never Used  Substance and Sexual Activity  . Alcohol use: Yes    Comment: Rarely; socially  . Drug use: No  . Sexual activity: Not Currently

## 2018-07-08 ENCOUNTER — Ambulatory Visit: Payer: Medicare Other | Admitting: Orthopaedic Surgery

## 2018-07-21 ENCOUNTER — Ambulatory Visit (INDEPENDENT_AMBULATORY_CARE_PROVIDER_SITE_OTHER): Payer: Medicare Other | Admitting: Orthopaedic Surgery

## 2018-07-21 ENCOUNTER — Other Ambulatory Visit: Payer: Self-pay

## 2018-07-21 ENCOUNTER — Encounter: Payer: Self-pay | Admitting: Orthopaedic Surgery

## 2018-07-21 ENCOUNTER — Telehealth: Payer: Self-pay | Admitting: *Deleted

## 2018-07-21 ENCOUNTER — Encounter: Payer: Self-pay | Admitting: Adult Health Nurse Practitioner

## 2018-07-21 ENCOUNTER — Ambulatory Visit: Payer: Medicare Other | Admitting: Adult Health Nurse Practitioner

## 2018-07-21 DIAGNOSIS — Z20822 Contact with and (suspected) exposure to covid-19: Secondary | ICD-10-CM

## 2018-07-21 DIAGNOSIS — R4589 Other symptoms and signs involving emotional state: Secondary | ICD-10-CM

## 2018-07-21 DIAGNOSIS — M5431 Sciatica, right side: Secondary | ICD-10-CM | POA: Diagnosis not present

## 2018-07-21 DIAGNOSIS — F418 Other specified anxiety disorders: Secondary | ICD-10-CM | POA: Diagnosis not present

## 2018-07-21 DIAGNOSIS — Z20828 Contact with and (suspected) exposure to other viral communicable diseases: Secondary | ICD-10-CM

## 2018-07-21 DIAGNOSIS — M545 Low back pain, unspecified: Secondary | ICD-10-CM

## 2018-07-21 DIAGNOSIS — G8929 Other chronic pain: Secondary | ICD-10-CM

## 2018-07-21 NOTE — Telephone Encounter (Signed)
Spoke with patient.  Advised she can go to [facility] tomorrow from testing between 8 am - 3:45 pm.  Testing protocol reviewed.  Patient expressed understanding.  Order placed.

## 2018-07-21 NOTE — Telephone Encounter (Signed)
-----   Message from Garnet Sierras, NP sent at 07/21/2018  4:16 PM EDT ----- Regarding: Brittney Gray [birth year] - COVID19 testing / Exposure Please contact for scheduling of COVID19 testing due to exposure.  Thank you.      Garnet Sierras, NP [facility] Office # [phone] Mobile # [phone]

## 2018-07-21 NOTE — Progress Notes (Signed)
Assessment and Plan:  Brittney Gray was seen today for other and exposure to covid-19.  Diagnoses and all orders for this visit:  Exposure to Covid-19 Virus -Sent order for COVID19 testing. Discussed precautions until tested  Anxiety about health Reassurance to patient, asymptomatic, IF she were positive, she it to remain away from others to decrease continued exposure and monitor for any symptoms. Discussed at length with patient. Call office with any new or worsening symptoms.   Discussed hospital precautions  Follow Up Instructions:  Discussed assessment and treatment plan with the patient. The patient was provided an opportunity to ask questions and all were answered. The patient agrees with the plan of care and demonstrates an understanding of the instructions.   The patient was advised to call back or seek an in-person evaluation if the symptoms worsen or if the condition fails to improve as anticipated.  I provided 20 minutes of non-face-to-face time during this encounter including interview, counseling, chart review, and critical decision making was preformed.   Future Appointments  Date Time Provider [place]  08/04/2018  8:45 AM [name], PA-C [facility] None  09/01/2018  8:15 AM Mcarthur Rossetti, MD [facility] None  11/13/2018  9:30 AM [name], PA-C [facility] None    ------------------------------------------------------------------------------------------------------------------   HPI 68 y.o.female presents for evaluation of COVID19 exposure.  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  She was given the result 07/20/18, Negative. On 07/16/18 she was with her niece, the spent the day together, time at her house and out to a store.  She reports three days later her niece went to the ER with lightheadedness and emesis.  She was also tested for COVID19 and it was positive.  She is concerned that she will need to be retested related  to this exposure.  She endorses increased anxiety about this as she does not want to expose anyone else.  She is completely asymptomatic.  She is a Medium COVID risk at this time.   She did not have vitals prior to phone appointment  Past Medical History:  Diagnosis Date  . Hypertension   . Obesity   . Pre-diabetes   . Vitamin D deficiency      Allergies  Allergen Reactions  . Ziac [Bisoprolol-Hydrochlorothiazide]     Flushing     Current Outpatient Medications on File Prior to Visit  Medication Sig  . allopurinol (ZYLOPRIM) 300 MG tablet Take 1 tablet daily to prevent Gout  . amLODipine (NORVASC) 10 MG tablet TAKE 1 TABLET BY MOUTH EVERY DAY (TAKE 1/2 TO 1 TABLET DEPENDING ON BLOOD PRESSURE READING)  . aspirin 81 MG tablet Take 81 mg by mouth daily.  . Cholecalciferol (VITAMIN D3) 25 MCG (1000 UT) CAPS Take 2 cap daily  . cyclobenzaprine (FLEXERIL) 10 MG tablet Take 1 tablet (10 mg total) by mouth at bedtime.  . hydrochlorothiazide (HYDRODIURIL) 25 MG tablet TAKE 1 TABLET EACH MORNING FOR BP & FLUID & SWELLING  . Magnesium 250 MG TABS Take by mouth daily.   No current facility-administered medications on file prior to visit.     ROS: all negative except above.   Physical Exam:  There were no vitals taken for this visit. General : Well sounding patient in no apparent distress HEENT: no hoarseness, no cough for duration of visit Lungs: speaks in complete sentences, no audible wheezing, no apparent distress Neurological: alert, oriented x 3 Psychiatric: pleasant, judgement appropriate    Garnet Sierras, NP  4:48 PM [facility]

## 2018-07-21 NOTE — Progress Notes (Signed)
The patient comes in today to go over an MRI of her lumbar spine.  She has been having right-sided sciatic symptoms.  Her bowels are managed in the morning.  It radiates down to behind her knee and down to her foot.  Denies any weakness at all.  She denies any change in bowel or bladder function.  Is been more of annoyance and irritation.  She is moderately obese and is 68 years old.  She tries to be as active as she can.  On exam she does have pain in the posterior pelvis and sciatic region to the right side only not the left side.  Her hip and knee exam are normal.  She does have pain when I extend her leg and perform a straight leg raise and this is on the right side.  She is got good strength in the bilateral lower extremities.  MRI is reviewed with her and it does show actually a paracentral disc protrusion at L4-L5 to the right side and this is in proximity of the L5 nerve.  This seems to correlate with her symptoms.  I would like to send her to physical therapy as an outpatient for back extension exercises and traction.  I would also like to send her to Dr. Ernestina Patches for a right-sided L4-L5 injection.  I explained this to her in detail what this involves and she does wish to proceed with both plans in terms of therapy and an injection.  All question concerns were answered and addressed.  I will see her back myself in about 6 weeks.

## 2018-07-22 ENCOUNTER — Other Ambulatory Visit: Payer: Self-pay

## 2018-07-22 ENCOUNTER — Other Ambulatory Visit: Payer: Self-pay | Admitting: Adult Health

## 2018-07-22 ENCOUNTER — Other Ambulatory Visit: Payer: Self-pay | Admitting: Internal Medicine

## 2018-07-22 DIAGNOSIS — F418 Other specified anxiety disorders: Secondary | ICD-10-CM

## 2018-07-22 MED ORDER — BUSPIRONE HCL 5 MG PO TABS: 5.0000 mg | ORAL_TABLET | Freq: Three times a day (TID) | ORAL | 1 refills | Status: DC

## 2018-07-26 LAB — NOVEL CORONAVIRUS, NAA: SARS-CoV-2, NAA: NOT DETECTED

## 2018-07-28 ENCOUNTER — Other Ambulatory Visit: Payer: Self-pay | Admitting: Physician Assistant

## 2018-07-28 NOTE — Telephone Encounter (Signed)
Ok to rf? 

## 2018-08-03 NOTE — Progress Notes (Signed)
MEDICARE WELLNESS  Assessment and Plan: Encounter for Medicare annual wellness exam 1 year Make MGM appointment  Type 2 diabetes mellitus with stage 2 chronic kidney disease, without long-term current use of insulin (Brittney Gray) -     Hemoglobin A1c Discussed general issues about diabetes pathophysiology and management., Educational material distributed., Suggested low cholesterol diet., Encouraged aerobic exercise., Discussed foot care., Reminded to get yearly retinal exam.  CKD stage 2 due to type 2 diabetes mellitus (Brittney Gray) -     Hemoglobin A1c Discussed general issues about diabetes pathophysiology and management., Educational material distributed., Suggested low cholesterol diet., Encouraged aerobic exercise., Discussed foot care., Reminded to get yearly retinal exam.  Morbid obesity (Brittney Gray) - follow up 3 months for progress monitoring - increase veggies, decrease carbs - long discussion about weight loss, diet, and exercise  Hyperlipidemia associated with type 2 diabetes mellitus (Brittney Gray) -     Lipid panel  Mixed hyperlipidemia check lipids decrease fatty foods increase activity.   Class 2 severe obesity due to excess calories with serious comorbidity and body mass index (BMI) of 38.0 to 38.9 in adult Brittney Gray) - follow up 3 months for progress monitoring - increase veggies, decrease carbs - long discussion about weight loss, diet, and exercise  Essential hypertension -     CBC with Differential/Platelet -     COMPLETE METABOLIC PANEL WITH GFR -     TSH - continue medications, DASH diet, exercise and monitor at home. Call if greater than 130/80.   Acute gout involving toe, unspecified cause, unspecified laterality -     Uric acid Stay on allopurinol daily  Vitamin D deficiency -     VITAMIN D 25 Hydroxy (Vit-D Deficiency, Fractures)  Medication management -     Magnesium  Iron deficiency anemia, unspecified iron deficiency anemia type monitor  Discussed med's effects and SE's.  Screening labs and tests as requested with regular follow-up as recommended. Over 30 minutes of exam, counseling, chart review, and complex, high level critical decision making was performed this visit.   Future Appointments  Date Time Provider [place]  08/10/2018  8:15 AM Brittney Sinning, MD [facility] None  09/01/2018  8:15 AM Brittney Rossetti, MD [facility] None  11/13/2018  9:30 AM Brittney Mutters, PA-C [facility] None     HPI  68 y.o. AA female, presents for a medicare and follow up. She has Essential hypertension; Hyperlipidemia associated with type 2 diabetes mellitus (Brittney Gray); Vitamin D deficiency; Type 2 diabetes with stage 2 chronic kidney disease GFR 60-89 (Brittney Gray); Morbid obesity (Brittney Gray); Medication management; CKD stage 2 due to type 2 diabetes mellitus (Brittney Gray); and Situational anxiety on their problem list.   June 30th she got tested and July 13th she got negative results, then July 14th she was around her niece and she got tested July 14th and she was positive. She then got retested on July 15th that was negative, she has been in her house since that time. She states she had horrible anxiety and had buspar that helped some, said it made her sleepy but helped relax.   She has persistent but intermittent R hip pain, follows Dr. Ninfa Gray, had MRI back with L4-L5 disc bulge on the right to L5 and going to get a shot Monday.    divorced/widowed, 6 son, 88 year old granddaughter, retired but still occasionally substitute teaches, very active in church -   BMI is Body mass index is 39.67 kg/m., she has not been working on diet and admits to very  poor eating habits, frequently doesn't eat until dinner, might eat crackers during the day.  Wt Readings from Last 3 Encounters:  08/04/18 196 lb 6.4 oz (89.1 kg)  05/04/18 196 lb 6.4 oz (89.1 kg)  01/15/18 198 lb (89.8 kg)   Her blood pressure has been controlled at home, today their BP is BP: 126/86 She does not workout. She denies chest  pain, shortness of breath, dizziness.   She is not on cholesterol medication, currently managed by lifestyle changes. Her cholesterol is at goal. The cholesterol last visit was:   Lab Results  Component Value Date   CHOL 161 05/04/2018   HDL 50 05/04/2018   LDLCALC 94 05/04/2018   TRIG 81 05/04/2018   CHOLHDL 3.2 05/04/2018   She has been working on diet and exercise for T2DM currently managed by lifestyle modification, she is on bASA, she is not on ACE/ARB and denies increased appetite, nausea, paresthesia of the feet, polydipsia, polyuria, visual disturbances and vomiting. Last A1C in the office was:  Lab Results  Component Value Date   HGBA1C 6.3 (H) 05/04/2018   Last GFR: Lab Results  Component Value Date   GFRNONAA 69 05/04/2018   Patient is on Vitamin D supplement but remained below goal at the last check:    Lab Results  Component Value Date   VD25OH 45 05/04/2018      Current Medications:  Current Outpatient Medications on File Prior to Visit  Medication Sig Dispense Refill  . allopurinol (ZYLOPRIM) 300 MG tablet Take 1 tablet daily to prevent Gout 90 tablet 3  . amLODipine (NORVASC) 10 MG tablet TAKE 1 TABLET BY MOUTH EVERY DAY (TAKE 1/2 TO 1 TABLET DEPENDING ON BLOOD PRESSURE READING) 90 tablet 1  . aspirin 81 MG tablet Take 81 mg by mouth daily.    . Cholecalciferol (VITAMIN D3) 25 MCG (1000 UT) CAPS Take 2 cap daily 60 capsule   . hydrochlorothiazide (HYDRODIURIL) 25 MG tablet TAKE 1 TABLET EACH MORNING FOR BP & FLUID & SWELLING 90 tablet 1  . Magnesium 250 MG TABS Take by mouth daily.    . busPIRone (BUSPAR) 5 MG tablet Take 1 tablet (5 mg total) by mouth 3 (three) times daily. (Patient not taking: Reported on 08/04/2018) 90 tablet 1  . cyclobenzaprine (FLEXERIL) 10 MG tablet TAKE 1 TABLET BY MOUTH EVERYDAY AT BEDTIME (Patient not taking: Reported on 08/04/2018) 40 tablet 0   No current facility-administered medications on file prior to visit.    Allergies:   Allergies  Allergen Reactions  . Ziac [Bisoprolol-Hydrochlorothiazide]     Flushing    Medical History:  She has Essential hypertension; Hyperlipidemia associated with type 2 diabetes mellitus ([name]); Vitamin D deficiency; Type 2 diabetes with stage 2 chronic kidney disease GFR 60-89 ([place]); Morbid obesity ([place]); Medication management; CKD stage 2 due to type 2 diabetes mellitus ([place]); and Situational anxiety on their problem list. Health Maintenance:   Immunization History  Administered Date(s) Administered  . Influenza, High Dose Seasonal PF 05/04/2018  . PPD Test 04/26/2013   Preventative care: Last colonoscopy: 09/2014 EGD: 09/2014 Dr. Collene Mares Last mammogram: 01/2017, will schedule Last pap smear/pelvic exam: 2016 declines another DEXA: 2013  Prior vaccinations: TD or Tdap: 2009, declines at this        Influenza: declines Pneumococcal: declines Prevnar13: declines Shingles/Zostavax: declines   Names of Other Physician/Practitioners you currently use: 1. [facility] here for primary care 2. Dr. Truman Hayward, eye doctor, last visit 02/2017  wears reading glasses- need follow up   3. Dr. Sallee Lange, dentist, last visit 2019, q21m  Patient Care Team: Unk Pinto, MD as PCP - General (Internal Medicine) Juanita Craver, MD as Consulting Physician (Gastroenterology)  Surgical History:  She has a past surgical history that includes Cesarean section (1971); Knee arthroscopy (Right); Carpal tunnel release (Right); and Tubal ligation. Family History:  Herfamily history includes Aneurysm in her sister; Diabetes in her brother and brother; Heart disease in her brother and brother; Hypertension in her sister; Stroke in her mother; Thyroid disease in her sister. Social History:  She reports that she has never smoked. She has never used smokeless tobacco. She reports current alcohol use. She reports that she does not use drugs.  MEDICARE WELLNESS  OBJECTIVES: Physical activity: Current Exercise Habits: The patient does not participate in regular exercise at present Cardiac risk factors: Cardiac Risk Factors include: advanced age (>33men, >39 women);diabetes mellitus;dyslipidemia;hypertension;obesity (BMI >30kg/m2);sedentary lifestyle;family history of premature cardiovascular disease Depression/mood screen:   Depression screen [facility] 2/9 08/04/2018  Decreased Interest 0  Down, Depressed, Hopeless 0  PHQ - 2 Score 0    ADLs:  In your present state of health, do you have any difficulty performing the following activities: 08/04/2018 05/03/2018  Hearing? N N  Vision? N N  Difficulty concentrating or making decisions? N N  Walking or climbing stairs? N N  Dressing or bathing? N N  Doing errands, shopping? N N  Some recent data might be hidden     Cognitive Testing  Alert? Yes  Normal Appearance?Yes  Oriented to person? Yes  Place? Yes   Time? Yes  Recall of three objects?  Yes  Can perform simple calculations? Yes  Displays appropriate judgment?Yes  Can read the correct time from a watch face?Yes  EOL planning: Does Patient Have a Medical Advance Directive?: No Would patient like information on creating a medical advance directive?: Yes (MAU/Ambulatory/Procedural Areas - Information given)    Review of Systems: Review of Systems  Constitutional: Negative for malaise/fatigue and weight loss.  HENT: Negative for hearing loss and tinnitus.   Eyes: Negative for blurred vision and double vision.  Respiratory: Negative for cough, shortness of breath and wheezing.   Cardiovascular: Negative for chest pain, palpitations, orthopnea, claudication and leg swelling.  Gastrointestinal: Negative for abdominal pain, blood in stool, constipation, diarrhea, heartburn, melena, nausea and vomiting.  Genitourinary: Negative.   Musculoskeletal: Positive for joint pain (R hip pain x 2-3 months). Negative for myalgias.  Skin: Negative for rash.   Neurological: Negative for dizziness, tingling, sensory change, weakness and headaches.  Endo/Heme/Allergies: Negative for polydipsia.  Psychiatric/Behavioral: Negative.  Negative for depression. The patient is not nervous/anxious and does not have insomnia.   All other systems reviewed and are negative.   Physical Exam: Estimated body mass index is 39.67 kg/m as calculated from the following:   Height as of this encounter: 4\' 11"  (1.499 m).   Weight as of this encounter: 196 lb 6.4 oz (89.1 kg). BP 126/86   Pulse (!) 101   Temp 98.1 F (36.7 C)   Ht 4\' 11"  (1.499 m)   Wt 196 lb 6.4 oz (89.1 kg)   SpO2 99%   BMI 39.67 kg/m  General Appearance: Well nourished, in no apparent distress.  Eyes: PERRLA, EOMs, conjunctiva no swelling or erythema, normal fundi and vessels.  Sinuses: No Frontal/maxillary tenderness  ENT/Mouth: Ext aud canals clear, normal light reflex with TMs without erythema, bulging. Good dentition. No erythema, swelling,  or exudate on post pharynx. Tonsils not swollen or erythematous. Hearing normal.  Neck: Supple, thyroid normal. No bruits  Respiratory: Respiratory effort normal, BS equal bilaterally without rales, rhonchi, wheezing or stridor.  Cardio: RRR without murmurs, rubs or gallops. Brisk peripheral pulses without edema.  Chest: symmetric, with normal excursions and percussion.  Breasts: Symmetric, without lumps, nipple discharge, retractions.  Abdomen: Soft, nontender, no guarding, rebound, hernias, masses, or organomegaly.  Lymphatics: Non tender without lymphadenopathy.  Musculoskeletal: Full ROM all peripheral extremities, 5/5 strength, and normal gait. Skin: Warm, dry without rashes, lesions, ecchymosis. Neuro: Cranial nerves intact, reflexes equal bilaterally. Normal muscle tone, no cerebellar symptoms. Sensation intact.  Psych: Awake and oriented X 3, normal affect, Insight and Judgment appropriate.    Medicare Attestation I have personally  reviewed: The patient's medical and social history Their use of alcohol, tobacco or illicit drugs Their current medications and supplements The patient's functional ability including ADLs,fall risks, home safety risks, cognitive, and hearing and visual impairment Diet and physical activities Evidence for depression or mood disorders  The patient's weight, height, BMI, and visual acuity have been recorded in the chart.  I have made referrals, counseling, and provided education to the patient based on review of the above and I have provided the patient with a written personalized care plan for preventive services.    Brittney Gray 8:56 AM [facility]

## 2018-08-04 ENCOUNTER — Other Ambulatory Visit: Payer: Self-pay

## 2018-08-04 ENCOUNTER — Ambulatory Visit (INDEPENDENT_AMBULATORY_CARE_PROVIDER_SITE_OTHER): Payer: Medicare Other | Admitting: Physician Assistant

## 2018-08-04 ENCOUNTER — Encounter: Payer: Self-pay | Admitting: Physician Assistant

## 2018-08-04 VITALS — BP 126/86 | HR 101 | Temp 98.1°F | Ht 59.0 in | Wt 196.4 lb

## 2018-08-04 DIAGNOSIS — M109 Gout, unspecified: Secondary | ICD-10-CM

## 2018-08-04 DIAGNOSIS — Z0001 Encounter for general adult medical examination with abnormal findings: Secondary | ICD-10-CM

## 2018-08-04 DIAGNOSIS — I10 Essential (primary) hypertension: Secondary | ICD-10-CM | POA: Diagnosis not present

## 2018-08-04 DIAGNOSIS — E782 Mixed hyperlipidemia: Secondary | ICD-10-CM | POA: Diagnosis not present

## 2018-08-04 DIAGNOSIS — D509 Iron deficiency anemia, unspecified: Secondary | ICD-10-CM

## 2018-08-04 DIAGNOSIS — E559 Vitamin D deficiency, unspecified: Secondary | ICD-10-CM

## 2018-08-04 DIAGNOSIS — E66812 Obesity, class 2: Secondary | ICD-10-CM

## 2018-08-04 DIAGNOSIS — E1169 Type 2 diabetes mellitus with other specified complication: Secondary | ICD-10-CM

## 2018-08-04 DIAGNOSIS — N182 Chronic kidney disease, stage 2 (mild): Secondary | ICD-10-CM

## 2018-08-04 DIAGNOSIS — Z0000 Encounter for general adult medical examination without abnormal findings: Secondary | ICD-10-CM

## 2018-08-04 DIAGNOSIS — R6889 Other general symptoms and signs: Secondary | ICD-10-CM

## 2018-08-04 DIAGNOSIS — E785 Hyperlipidemia, unspecified: Secondary | ICD-10-CM

## 2018-08-04 DIAGNOSIS — E1122 Type 2 diabetes mellitus with diabetic chronic kidney disease: Secondary | ICD-10-CM

## 2018-08-04 DIAGNOSIS — Z79899 Other long term (current) drug therapy: Secondary | ICD-10-CM

## 2018-08-04 DIAGNOSIS — Z6838 Body mass index (BMI) 38.0-38.9, adult: Secondary | ICD-10-CM

## 2018-08-04 NOTE — Patient Instructions (Signed)
I want to challenge you to lose 10 lbs before the next appointment.  That would be about 3 lbs a month!  This is very achievable and I know you can do it.  10 lbs is actually 40 lbs of pressure of your joints, back, hips, knees and ankles.  10lbs may be enough to help your blood pressure and cholesterol for next visit.   Intermittent fasting is more about strategy than starvation. It's meant to reset your body in different ways, hopefully with fitness and nutrition changes as a result.  Like any big switchover, though, results may vary when it comes down to the individual level. What works for your friends may not work for you, or vice versa. That's why it's helpful to play around with variations on intermittent fasting and healthy habits and find what works best for you.  WHAT IS INTERMITTENT FASTING AND WHY DO IT?  Intermittent fasting doesn't involve specific foods, but rather, a strict schedule regarding when you eat. Also called "time-restricted eating," the tactic has been praised for its contribution to weight loss, improved body composition, and decreased cravings. Preliminary research also suggests it may be beneficial for glucose tolerance, hormone regulation, better muscle mass and lower body fat.  Part of its appeal is the simplicity of the effort. Unlike some other trends, there's no calculations to intermittent fasting.  You simply eat within a certain block of time, usually a window of 8-10 hours. In the other big block of time - about 14-16 hours, including when you're asleep - you don't eat anything, not even snacks. You can drink water, coffee, tea or any other beverage that doesn't have calories.  For example, if you like having a late dinner, you might skip breakfast and have your first meal at noon and your last meal of the day at 8 p.m., and then not eat until noon again the next day.  IDEAS FOR GETTING STARTED  If you're new to the strategy, it may be helpful to eat  within the typical circadian rhythm and keep eating within daylight hours. This can be especially beneficial if you're looking at intermittent fasting for weight-loss goals.  So first try only eating between 12pm to 8pm.  Outside of this time you may have water, black coffee, and hot tea. You may not eat it drink anything that has carbs, sugars, OR artificial sugars like diet soda.   Like any major eating and fitness shift, it can take time to find the perfect fit, so don't be afraid to experiment with different options - including ditching intermittent fasting altogether if it's simply not for you. But if it is, you may be surprised by some of the benefits that come along with the strategy.     When it comes to diets, agreement about the perfect plan isn't easy to find, even among the experts. Experts at the [place] developed an idea known as the Healthy Eating Plate. Just imagine a plate divided into logical, healthy portions.  The emphasis is on diet quality:  Load up on vegetables and fruits - one-half of your plate: Aim for color and variety, and remember that potatoes don't count.  Go for whole grains - one-quarter of your plate: Whole wheat, barley, wheat berries, quinoa, oats, brown rice, and foods made with them. If you want pasta, go with whole wheat pasta.  Protein power - one-quarter of your plate: Fish, chicken, beans, and nuts are all healthy, versatile protein sources.  Limit red meat.  The diet, however, does go beyond the plate, offering a few other suggestions.  Use healthy plant oils, such as olive, canola, soy, corn, sunflower and peanut. Check the labels, and avoid partially hydrogenated oil, which have unhealthy trans fats.  If you're thirsty, drink water. Coffee and tea are good in moderation, but skip sugary drinks and limit milk and dairy products to one or two daily servings.  The type of carbohydrate in the diet is more important than the  amount. Some sources of carbohydrates, such as vegetables, fruits, whole grains, and beans-are healthier than others.  Finally, stay active.

## 2018-08-05 LAB — TSH: TSH: 1.28 mIU/L (ref 0.40–4.50)

## 2018-08-05 LAB — CBC WITH DIFFERENTIAL/PLATELET
Absolute Monocytes: 539 cells/uL (ref 200–950)
Basophils Absolute: 50 cells/uL (ref 0–200)
Basophils Relative: 0.8 %
Eosinophils Absolute: 211 cells/uL (ref 15–500)
Eosinophils Relative: 3.4 %
HCT: 43.6 % (ref 35.0–45.0)
Hemoglobin: 14.3 g/dL (ref 11.7–15.5)
Lymphs Abs: 2046 cells/uL (ref 850–3900)
MCH: 28.1 pg (ref 27.0–33.0)
MCHC: 32.8 g/dL (ref 32.0–36.0)
MCV: 85.8 fL (ref 80.0–100.0)
MPV: 10.3 fL (ref 7.5–12.5)
Monocytes Relative: 8.7 %
Neutro Abs: 3354 cells/uL (ref 1500–7800)
Neutrophils Relative %: 54.1 %
Platelets: 297 10*3/uL (ref 140–400)
RBC: 5.08 10*6/uL (ref 3.80–5.10)
RDW: 13.8 % (ref 11.0–15.0)
Total Lymphocyte: 33 %
WBC: 6.2 10*3/uL (ref 3.8–10.8)

## 2018-08-05 LAB — COMPLETE METABOLIC PANEL WITH GFR
AG Ratio: 1.3 (calc) (ref 1.0–2.5)
ALT: <3 U/L — ABNORMAL LOW (ref 6–29)
AST: 13 U/L (ref 10–35)
Albumin: 3.9 g/dL (ref 3.6–5.1)
Alkaline phosphatase (APISO): 54 U/L (ref 37–153)
BUN: 12 mg/dL (ref 7–25)
CO2: 26 mmol/L (ref 20–32)
Calcium: 9.8 mg/dL (ref 8.6–10.4)
Chloride: 103 mmol/L (ref 98–110)
Creat: 0.87 mg/dL (ref 0.50–0.99)
GFR, Est African American: 79 mL/min/{1.73_m2} (ref >=60)
GFR, Est Non African American: 68 mL/min/{1.73_m2} (ref >=60)
Globulin: 2.9 g/dL (calc) (ref 1.9–3.7)
Glucose, Bld: 95 mg/dL (ref 65–99)
Potassium: 4.3 mmol/L (ref 3.5–5.3)
Sodium: 139 mmol/L (ref 135–146)
Total Bilirubin: 0.6 mg/dL (ref 0.2–1.2)
Total Protein: 6.8 g/dL (ref 6.1–8.1)

## 2018-08-05 LAB — HEMOGLOBIN A1C
Hgb A1c MFr Bld: 6.3 % of total Hgb — ABNORMAL HIGH (ref <5.7)
Mean Plasma Glucose: 134 (calc)
eAG (mmol/L): 7.4 (calc)

## 2018-08-05 LAB — URIC ACID: Uric Acid, Serum: 6.4 mg/dL (ref 2.5–7.0)

## 2018-08-05 LAB — MAGNESIUM: Magnesium: 1.8 mg/dL (ref 1.5–2.5)

## 2018-08-05 LAB — LIPID PANEL
Cholesterol: 152 mg/dL (ref <200)
HDL: 46 mg/dL — ABNORMAL LOW (ref >=50)
LDL Cholesterol (Calc): 89 mg/dL (calc)
Non-HDL Cholesterol (Calc): 106 mg/dL (calc) (ref <130)
Total CHOL/HDL Ratio: 3.3 (calc) (ref <5.0)
Triglycerides: 82 mg/dL (ref <150)

## 2018-08-05 LAB — VITAMIN D 25 HYDROXY (VIT D DEFICIENCY, FRACTURES): Vit D, 25-Hydroxy: 37 ng/mL (ref 30–100)

## 2018-08-10 ENCOUNTER — Telehealth: Payer: Self-pay | Admitting: *Deleted

## 2018-08-10 ENCOUNTER — Ambulatory Visit: Payer: Self-pay

## 2018-08-10 ENCOUNTER — Other Ambulatory Visit: Payer: Self-pay

## 2018-08-10 ENCOUNTER — Ambulatory Visit (INDEPENDENT_AMBULATORY_CARE_PROVIDER_SITE_OTHER): Payer: Medicare Other | Admitting: Physical Medicine and Rehabilitation

## 2018-08-10 ENCOUNTER — Encounter: Payer: Self-pay | Admitting: Physical Medicine and Rehabilitation

## 2018-08-10 VITALS — BP 138/95 | HR 90

## 2018-08-10 DIAGNOSIS — M5416 Radiculopathy, lumbar region: Secondary | ICD-10-CM

## 2018-08-10 DIAGNOSIS — M5116 Intervertebral disc disorders with radiculopathy, lumbar region: Secondary | ICD-10-CM

## 2018-08-10 MED ORDER — METHYLPREDNISOLONE ACETATE 80 MG/ML IJ SUSP
80.0000 mg | Freq: Once | INTRAMUSCULAR | Status: AC
  Administered 2018-08-10: 80 mg

## 2018-08-10 NOTE — Procedures (Signed)
Lumbar Epidural Steroid Injection - Interlaminar Approach with Fluoroscopic Guidance  Patient: Brittney Gray      Date of Birth: 01-30-1950 MRN: [id number] PCP: Unk Pinto, MD      Visit Date: 08/10/2018   Universal Protocol:     Consent Given By: the patient  Position: PRONE  Additional Comments: Vital signs were monitored before and after the procedure. Patient was prepped and draped in the usual sterile fashion. The correct patient, procedure, and site was verified.   Injection Procedure Details:  Procedure Site One Meds Administered:  Meds ordered this encounter  Medications  . methylPREDNISolone acetate (DEPO-MEDROL) injection 80 mg     Laterality: Right  Location/Site:  L5-S1  Needle size: 20 G  Needle type: Tuohy  Needle Placement: Paramedian epidural  Findings:   -Comments: Excellent flow of contrast into the epidural space.  Procedure Details: Using a paramedian approach from the side mentioned above, the region overlying the inferior lamina was localized under fluoroscopic visualization and the soft tissues overlying this structure were infiltrated with 4 ml. of 1% Lidocaine without Epinephrine. The Tuohy needle was inserted into the epidural space using a paramedian approach.   The epidural space was localized using loss of resistance along with lateral and bi-planar fluoroscopic views.  After negative aspirate for air, blood, and CSF, a 2 ml. volume of Isovue-250 was injected into the epidural space and the flow of contrast was observed. Radiographs were obtained for documentation purposes.    The injectate was administered into the level noted above.   Additional Comments:  The patient tolerated the procedure well Dressing: 2 x 2 sterile gauze and Band-Aid    Post-procedure details: Patient was observed during the procedure. Post-procedure instructions were reviewed.  Patient left the clinic in stable condition.

## 2018-08-10 NOTE — Progress Notes (Signed)
Brittney Gray - 68 y.o. female MRN [id number]  Date of birth: [birth year]  Office Visit Note: Visit Date: 08/10/2018 PCP: Unk Pinto, MD Referred by: Unk Pinto, MD  Subjective: Chief Complaint  Patient presents with  . Lower Back - Pain   HPI:  Brittney Gray is a 68 y.o. female who comes in today At the request of Dr. Jean Rosenthal for diagnostic of therapeutic right L5-S1 interlaminar epidural steroid injection.  Patient has disc herniation protrusion at L4-5 on the right with likely pain coming from the L5 nerve root.  We will see her back in a few weeks to see if she needs a second injection.  Hopefully she does well with just the 1 injection and she is very nervous about having it today but did extremely well with the injection itself.  She is having pretty classic L5 distribution pain.  She is failed conservative care including medications and exercises and time.  MRI was reviewed below.  ROS Otherwise per HPI.  Assessment & Plan: Visit Diagnoses:  1. Lumbar radiculopathy   2. Radiculopathy due to lumbar intervertebral disc disorder     Plan: No additional findings.   Meds & Orders:  Meds ordered this encounter  Medications  . methylPREDNISolone acetate (DEPO-MEDROL) injection 80 mg    Orders Placed This Encounter  Procedures  . XR C-ARM NO REPORT  . Epidural Steroid injection    Follow-up: Return in about 2 weeks (around 08/24/2018) for Repeat injection versus L5 Transforaminal epidural.   Procedures: No procedures performed  Lumbar Epidural Steroid Injection - Interlaminar Approach with Fluoroscopic Guidance  Patient: Brittney Gray      Date of Birth: January 21, 1950 MRN: [id number] PCP: Unk Pinto, MD      Visit Date: 08/10/2018   Universal Protocol:     Consent Given By: the patient  Position: PRONE  Additional Comments: Vital signs were monitored before and after the procedure. Patient was prepped and draped in the usual sterile fashion.  The correct patient, procedure, and site was verified.   Injection Procedure Details:  Procedure Site One Meds Administered:  Meds ordered this encounter  Medications  . methylPREDNISolone acetate (DEPO-MEDROL) injection 80 mg     Laterality: Right  Location/Site:  L5-S1  Needle size: 20 G  Needle type: Tuohy  Needle Placement: Paramedian epidural  Findings:   -Comments: Excellent flow of contrast into the epidural space.  Procedure Details: Using a paramedian approach from the side mentioned above, the region overlying the inferior lamina was localized under fluoroscopic visualization and the soft tissues overlying this structure were infiltrated with 4 ml. of 1% Lidocaine without Epinephrine. The Tuohy needle was inserted into the epidural space using a paramedian approach.   The epidural space was localized using loss of resistance along with lateral and bi-planar fluoroscopic views.  After negative aspirate for air, blood, and CSF, a 2 ml. volume of Isovue-250 was injected into the epidural space and the flow of contrast was observed. Radiographs were obtained for documentation purposes.    The injectate was administered into the level noted above.   Additional Comments:  The patient tolerated the procedure well Dressing: 2 x 2 sterile gauze and Band-Aid    Post-procedure details: Patient was observed during the procedure. Post-procedure instructions were reviewed.  Patient left the clinic in stable condition.   Clinical History: MRI LUMBAR SPINE WITHOUT CONTRAST  TECHNIQUE: Multiplanar, multisequence MR imaging of the lumbar spine was performed. No intravenous contrast was administered.  COMPARISON: 06/22/2018  radiography  FINDINGS: Segmentation: 5 lumbar type vertebrae  Alignment: Borderline anterolisthesis at L4-5.  Vertebrae: No fracture, evidence of discitis, or bone lesion.  Conus medullaris and cauda equina: Conus extends to the T12-L1 level.  Conus and cauda equina appear normal.  Paraspinal and other soft tissues: Negative  Disc levels:  T12- L1: Unremarkable.  L1-L2: Unremarkable.  L2-L3: Minor disc bulging  L3-L4: Mild disc narrowing and desiccation with bulge. No impingement. Mild spinal stenosis  L4-L5: Disc narrowing and bulging with a right paracentral protrusion impinging on the descending L5 nerve root. Spinal stenosis is mild to moderate  L5-S1:Disc narrowing with primarily far-lateral spurring and bulging.  IMPRESSION: 1. Symptomatic finding is likely L4-5 right paracentral protrusion which impinges on the descending L5 nerve root. 2. Noncompressive disc degeneration at L3-4 and L4-5.   Electronically Signed By: Monte Fantasia M.D. On: 07/04/2018 08:53     Objective:  VS:  HT:    WT:   BMI:     BP:(!) 138/95  HR:90bpm  TEMP: ( )  RESP:  Physical Exam  Ortho Exam Imaging: Xr C-arm No Report  Result Date: 08/10/2018 Please see Notes tab for imaging impression.

## 2018-08-10 NOTE — Progress Notes (Signed)
 .  Numeric Pain Rating Scale and Functional Assessment Average Pain 8   In the last MONTH (on 0-10 scale) has pain interfered with the following?  1. General activity like being  able to carry out your everyday physical activities such as walking, climbing stairs, carrying groceries, or moving a chair?  Rating(7)   +Driver, -BT, -Dye Allergies.  

## 2018-08-10 NOTE — Telephone Encounter (Signed)
Notification or Prior Authorization is not required for the requested services  This [employer] members plan does not currently require a prior authorization for [id number]  Decision ID #:[id number]

## 2018-08-13 ENCOUNTER — Other Ambulatory Visit: Payer: Self-pay | Admitting: Adult Health

## 2018-08-13 DIAGNOSIS — F418 Other specified anxiety disorders: Secondary | ICD-10-CM

## 2018-08-20 ENCOUNTER — Other Ambulatory Visit: Payer: Self-pay

## 2018-08-20 ENCOUNTER — Encounter: Payer: Self-pay | Admitting: Physical Therapy

## 2018-08-20 ENCOUNTER — Ambulatory Visit: Payer: Medicare Other | Attending: Orthopaedic Surgery | Admitting: Physical Therapy

## 2018-08-20 DIAGNOSIS — R262 Difficulty in walking, not elsewhere classified: Secondary | ICD-10-CM

## 2018-08-20 DIAGNOSIS — M545 Low back pain, unspecified: Secondary | ICD-10-CM

## 2018-08-20 DIAGNOSIS — G8929 Other chronic pain: Secondary | ICD-10-CM | POA: Diagnosis present

## 2018-08-20 DIAGNOSIS — M6281 Muscle weakness (generalized): Secondary | ICD-10-CM

## 2018-08-20 NOTE — Therapy (Signed)
[place], [place], [address] Phone: [phone]   Fax:  [phone]  Physical Therapy Evaluation  Patient Details  Name: Brittney Gray MRN: [id number] Date of Birth: Jan 06, 1951 Referring Provider (PT): Jean Rosenthal, MD   Encounter Date: 08/20/2018  PT End of Session - 08/20/18 0818    Visit Number  1    Number of Visits  6    Date for PT Re-Evaluation  09/25/18    Authorization Type  [employer] MCR- KX at visit 15    PN at visit 10    PT Start Time  0805    PT Stop Time  0843    PT Time Calculation (min)  38 min    Activity Tolerance  Patient tolerated treatment well    Behavior During Therapy  [facility] for tasks assessed/performed       Past Medical History:  Diagnosis Date  . Hypertension   . Obesity   . Pre-diabetes   . Vitamin D deficiency     Past Surgical History:  Procedure Laterality Date  . CARPAL TUNNEL RELEASE Right   . CESAREAN SECTION  1971  . KNEE ARTHROSCOPY Right   . TUBAL LIGATION      There were no vitals filed for this visit.   Subjective Assessment - 08/20/18 0810    Subjective  Dr Ernestina Patches did an injection about a week ago which helped but I still have discomfort into Rt hip. Occasional cramping in Rt gastroc when I flex my foot. Have another appointment on the 24th if I need it.    How long can you stand comfortably?  curling my cousins hair was uncomfortable.    Patient Stated Goals  decrease pain with standing, walking program for weight loss    Currently in Pain?  Yes    Pain Score  2     Pain Location  Hip    Pain Orientation  Right    Pain Descriptors / Indicators  Aching    Aggravating Factors   standing too long    Pain Relieving Factors  laying supine, stretching         [facility] PT Assessment - 08/20/18 0001      Assessment   Medical Diagnosis  LBP    Referring Provider (PT)  Jean Rosenthal, MD    Onset Date/Surgical Date  --   a year or more   Hand  Dominance  Right    Next MD Visit  8/24    Prior Therapy  no      Precautions   Precautions  None      Restrictions   Weight Bearing Restrictions  No      Balance Screen   Has the patient fallen in the past 6 months  No      [place] residence    Living Arrangements  Alone    Additional Comments  stairs at home      Prior Function   Level of [place]  Retired      Associate Professor   Overall Cognitive Status  Within Functional Limits for tasks assessed      Observation/Other Assessments   Focus on Therapeutic Outcomes (FOTO)   38% limited      Sensation   Additional Comments  [facility]      Posture/Postural Control   Posture Comments  incr lumbar lordosis with anterior pelvic tilt  ROM / Strength   AROM / PROM / Strength  Strength      Strength   Strength Assessment Site  Hip    Right/Left Hip  Right;Left    Right Hip Flexion  4/5    Right Hip ABduction  4-/5    Left Hip Flexion  4/5    Left Hip ABduction  4-/5      Palpation   Palpation comment  denied TTP at bil SIJ                Objective measurements completed on examination: See above findings.      [facility] Adult PT Treatment/Exercise - 08/20/18 0001      Exercises   Exercises  Knee/Hip;Other Exercises    Other Exercises   discussed progressing walking program      Knee/Hip Exercises: Stretches   Passive Hamstring Stretch Limitations  in seated & supine    Piriformis Stretch Limitations  in seated & supine    Gastroc Stretch Limitations  standing at wall             PT Education - 08/20/18 1218    Education Details  anatomy of condition, POC, HEP, exercise form/rationale    Person(s) Educated  Patient    Methods  Explanation;Demonstration;Tactile cues;Verbal cues;Handout    Comprehension  Verbalized understanding;Returned demonstration;Verbal cues required;Tactile cues required;Need further instruction       PT Short  Term Goals - 08/20/18 1210      PT SHORT TERM GOAL #1   Title  STG=LTG        PT Long Term Goals - 08/20/18 1210      PT LONG TERM GOAL #1   Title  gross hip strength 5/5    Baseline  see flowsheet    Time  4    Period  Weeks    Status  New    Target Date  09/18/18      PT LONG TERM GOAL #2   Title  pt will be able to walk for at least 45 min for exercise without incr LBP    Baseline  unable to walk "a long time" right now    Time  4    Period  Weeks    Status  New    Target Date  09/18/18      PT LONG TERM GOAL #3   Title  Pt will tolerate standing for ADLs with understanding of use of postures and stretches to decrease limitation by back    Baseline  will educate as appropriate    Time  4    Period  Weeks    Status  New    Target Date  09/18/18      PT LONG TERM GOAL #4   Title  FOTO to 28% limited    Baseline  38% limited    Time  4    Period  Weeks    Status  New    Target Date  09/18/18             Plan - 08/20/18 0848    Clinical Impression Statement  Pt presents to PT with complaints of chronic back pain that was reduced with injection. continues to have discomfort in lateral hip and calf musculature so stretching and walking programs were discussed today. Provided with options for STM using roller and tennis ball. Injections were very helpful and pt will benefit from skilled PT to address muscular deficits contributing to limitations and  meet goals.    Personal Factors and Comorbidities  Comorbidity 1    Comorbidities  obesity, h/o TKA    Examination-Activity Limitations  Locomotion Level;Bend;Sit;Carry;Sleep;Squat;Stand;Lift    Examination-Participation Restrictions  Meal Prep;Cleaning;Community Activity   exercise   Stability/Clinical Decision Making  Stable/Uncomplicated    Clinical Decision Making  Low    Rehab Potential  Good    PT Frequency  --   2 in first week followed by 1/week after   PT Duration  4 weeks    PT Treatment/Interventions   ADLs/Self Care Home Management;Cryotherapy;Electrical Stimulation;Ultrasound;Traction;Moist Heat;Iontophoresis 4mg /ml Dexamethasone;Gait training;Stair training;Functional mobility training;Therapeutic activities;Therapeutic exercise;Balance training;Patient/family education;Neuromuscular re-education;Manual techniques;Passive range of motion;Dry needling;Spinal Manipulations;Taping    PT Next Visit Plan  traction PRN, gross strengthening & core activation, did she work on walking program?    PT Home Exercise Plan  HSS, figure 4, gastroc stretch, walking program short of painful distance.    Consulted and Agree with Plan of Care  Patient       Patient will benefit from skilled therapeutic intervention in order to improve the following deficits and impairments:  Difficulty walking, Increased muscle spasms, Decreased activity tolerance, Pain, Improper body mechanics, Decreased strength, Postural dysfunction  Visit Diagnosis: 1. Chronic right-sided low back pain without sciatica   2. Difficulty in walking, not elsewhere classified   3. Muscle weakness (generalized)        Problem List Patient Active Problem List   Diagnosis Date Noted  . Situational anxiety 07/22/2018  . CKD stage 2 due to type 2 diabetes mellitus ([place]) 12/30/2017  . Medication management 04/12/2015  . Morbid obesity ([place]) 09/16/2013  . Type 2 diabetes with stage 2 chronic kidney disease GFR 60-89 ([place]) 01/07/2013  . Essential hypertension 01/03/2013  . Hyperlipidemia associated with type 2 diabetes mellitus ([place]) 01/03/2013  . Vitamin D deficiency 01/03/2013  Nathian Stencil C. Cloa Bushong PT, DPT 08/20/18 12:19 PM   [facility] [address] [place], [place], [address] Phone: [phone]   Fax:  [phone]  Name: Noni Stonesifer MRN: [id number] Date of Birth: 1950/02/28

## 2018-08-22 ENCOUNTER — Other Ambulatory Visit: Payer: Self-pay | Admitting: Adult Health

## 2018-08-22 DIAGNOSIS — R609 Edema, unspecified: Secondary | ICD-10-CM

## 2018-08-22 DIAGNOSIS — I10 Essential (primary) hypertension: Secondary | ICD-10-CM

## 2018-08-28 ENCOUNTER — Encounter

## 2018-08-31 ENCOUNTER — Ambulatory Visit: Payer: Self-pay

## 2018-08-31 ENCOUNTER — Ambulatory Visit (INDEPENDENT_AMBULATORY_CARE_PROVIDER_SITE_OTHER): Payer: Medicare Other | Admitting: Physical Medicine and Rehabilitation

## 2018-08-31 ENCOUNTER — Encounter: Payer: Self-pay | Admitting: Physical Medicine and Rehabilitation

## 2018-08-31 VITALS — BP 129/91 | HR 86

## 2018-08-31 DIAGNOSIS — M5116 Intervertebral disc disorders with radiculopathy, lumbar region: Secondary | ICD-10-CM

## 2018-08-31 DIAGNOSIS — M519 Unspecified thoracic, thoracolumbar and lumbosacral intervertebral disc disorder: Secondary | ICD-10-CM

## 2018-08-31 DIAGNOSIS — M5416 Radiculopathy, lumbar region: Secondary | ICD-10-CM

## 2018-08-31 HISTORY — DX: Unspecified thoracic, thoracolumbar and lumbosacral intervertebral disc disorder: M51.9

## 2018-08-31 MED ORDER — METHYLPREDNISOLONE ACETATE 80 MG/ML IJ SUSP
80.0000 mg | Freq: Once | INTRAMUSCULAR | Status: AC
  Administered 2018-08-31: 80 mg

## 2018-08-31 NOTE — Progress Notes (Signed)
  Numeric Pain Rating Scale and Functional Assessment Average Pain 5   In the last MONTH (on 0-10 scale) has pain interfered with the following?  1. General activity like being  able to carry out your everyday physical activities such as walking, climbing stairs, carrying groceries, or moving a chair?  Rating(8)   +Driver, -BT, -Dye Allergies.  

## 2018-09-01 ENCOUNTER — Ambulatory Visit: Payer: Medicare Other | Admitting: Orthopaedic Surgery

## 2018-09-01 NOTE — Progress Notes (Signed)
Brittney Gray - 68 y.o. female MRN [id number]  Date of birth: [birth year]  Office Visit Note: Visit Date: 08/31/2018 PCP: Unk Pinto, MD Referred by: Unk Pinto, MD  Subjective: No chief complaint on file.  HPI:  Brittney Gray is a 68 y.o. female who comes in today For planned repeat L5-S1 interlaminar epidural steroid injection to the right.  Prior injection on August 10, 2018 relieved a lot of her leg pain but not her buttock pain and back pain.  She rates her pain now is a 5 out of 10 which is improved.  She has a right paracentral disc herniation at L4-5 with some stenosis.  We will repeat the injection today.  Her case is complicated by type 2 diabetes with complications.  Depending on relief would look at either an L5 transforaminal approach or potentially referral for spine surgery or neurosurgical evaluation.  We will continue with current medications.  She is failed conservative care otherwise.  ROS Otherwise per HPI.  Assessment & Plan: Visit Diagnoses:  1. Lumbar radiculopathy   2. Radiculopathy due to lumbar intervertebral disc disorder     Plan: No additional findings.   Meds & Orders:  Meds ordered this encounter  Medications  . methylPREDNISolone acetate (DEPO-MEDROL) injection 80 mg    Orders Placed This Encounter  Procedures  . XR C-ARM NO REPORT  . Epidural Steroid injection    Follow-up: Return if symptoms worsen or fail to improve, for Consider L5 transforaminal injection versus spine surgery evaluation.   Procedures: No procedures performed  Lumbar Epidural Steroid Injection - Interlaminar Approach with Fluoroscopic Guidance  Patient: Brittney Gray      Date of Birth: 1950/06/09 MRN: [id number] PCP: Unk Pinto, MD      Visit Date: 08/31/2018   Universal Protocol:     Consent Given By: the patient  Position: PRONE  Additional Comments: Vital signs were monitored before and after the procedure. Patient was prepped and draped in the  usual sterile fashion. The correct patient, procedure, and site was verified.   Injection Procedure Details:  Procedure Site One Meds Administered:  Meds ordered this encounter  Medications  . methylPREDNISolone acetate (DEPO-MEDROL) injection 80 mg     Laterality: Right  Location/Site:  L5-S1  Needle size: 20 G  Needle type: Tuohy  Needle Placement: Paramedian epidural  Findings:   -Comments: Excellent flow of contrast into the epidural space.  Procedure Details: Using a paramedian approach from the side mentioned above, the region overlying the inferior lamina was localized under fluoroscopic visualization and the soft tissues overlying this structure were infiltrated with 4 ml. of 1% Lidocaine without Epinephrine. The Tuohy needle was inserted into the epidural space using a paramedian approach.   The epidural space was localized using loss of resistance along with lateral and bi-planar fluoroscopic views.  After negative aspirate for air, blood, and CSF, a 2 ml. volume of Isovue-250 was injected into the epidural space and the flow of contrast was observed. Radiographs were obtained for documentation purposes.    The injectate was administered into the level noted above.   Additional Comments:  The patient tolerated the procedure well Dressing: 2 x 2 sterile gauze and Band-Aid    Post-procedure details: Patient was observed during the procedure. Post-procedure instructions were reviewed.  Patient left the clinic in stable condition.   Clinical History: MRI LUMBAR SPINE WITHOUT CONTRAST  TECHNIQUE: Multiplanar, multisequence MR imaging of the lumbar spine was performed. No intravenous contrast was administered.  COMPARISON:  06/22/2018 radiography  FINDINGS: Segmentation: 5 lumbar type vertebrae  Alignment: Borderline anterolisthesis at L4-5.  Vertebrae: No fracture, evidence of discitis, or bone lesion.  Conus medullaris and cauda equina: Conus extends  to the T12-L1 level. Conus and cauda equina appear normal.  Paraspinal and other soft tissues: Negative  Disc levels:  T12- L1: Unremarkable.  L1-L2: Unremarkable.  L2-L3: Minor disc bulging  L3-L4: Mild disc narrowing and desiccation with bulge. No impingement. Mild spinal stenosis  L4-L5: Disc narrowing and bulging with a right paracentral protrusion impinging on the descending L5 nerve root. Spinal stenosis is mild to moderate  L5-S1:Disc narrowing with primarily far-lateral spurring and bulging.  IMPRESSION: 1. Symptomatic finding is likely L4-5 right paracentral protrusion which impinges on the descending L5 nerve root. 2. Noncompressive disc degeneration at L3-4 and L4-5.   Electronically Signed By: Monte Fantasia M.D. On: 07/04/2018 08:53     Objective:  VS:  HT:    WT:   BMI:     BP:(!) 129/91  HR:86bpm  TEMP: ( )  RESP:  Physical Exam  Ortho Exam Imaging: Xr C-arm No Report  Result Date: 08/31/2018 Please see Notes tab for imaging impression.

## 2018-09-01 NOTE — Procedures (Signed)
Lumbar Epidural Steroid Injection - Interlaminar Approach with Fluoroscopic Guidance  Patient: Brittney Gray      Date of Birth: May 10, 1950 MRN: [id number] PCP: Unk Pinto, MD      Visit Date: 08/31/2018   Universal Protocol:     Consent Given By: the patient  Position: PRONE  Additional Comments: Vital signs were monitored before and after the procedure. Patient was prepped and draped in the usual sterile fashion. The correct patient, procedure, and site was verified.   Injection Procedure Details:  Procedure Site One Meds Administered:  Meds ordered this encounter  Medications  . methylPREDNISolone acetate (DEPO-MEDROL) injection 80 mg     Laterality: Right  Location/Site:  L5-S1  Needle size: 20 G  Needle type: Tuohy  Needle Placement: Paramedian epidural  Findings:   -Comments: Excellent flow of contrast into the epidural space.  Procedure Details: Using a paramedian approach from the side mentioned above, the region overlying the inferior lamina was localized under fluoroscopic visualization and the soft tissues overlying this structure were infiltrated with 4 ml. of 1% Lidocaine without Epinephrine. The Tuohy needle was inserted into the epidural space using a paramedian approach.   The epidural space was localized using loss of resistance along with lateral and bi-planar fluoroscopic views.  After negative aspirate for air, blood, and CSF, a 2 ml. volume of Isovue-250 was injected into the epidural space and the flow of contrast was observed. Radiographs were obtained for documentation purposes.    The injectate was administered into the level noted above.   Additional Comments:  The patient tolerated the procedure well Dressing: 2 x 2 sterile gauze and Band-Aid    Post-procedure details: Patient was observed during the procedure. Post-procedure instructions were reviewed.  Patient left the clinic in stable condition.

## 2018-09-02 ENCOUNTER — Ambulatory Visit (INDEPENDENT_AMBULATORY_CARE_PROVIDER_SITE_OTHER): Payer: Medicare Other | Admitting: Orthopaedic Surgery

## 2018-09-02 DIAGNOSIS — M5431 Sciatica, right side: Secondary | ICD-10-CM

## 2018-09-02 DIAGNOSIS — M4807 Spinal stenosis, lumbosacral region: Secondary | ICD-10-CM | POA: Diagnosis not present

## 2018-09-02 NOTE — Progress Notes (Signed)
The patient is here in follow-up after having to epidural steroid injections by Dr. Ernestina Patches this month.  One was earlier this month along with just 2 days ago.  She is feeling much better overall.  She is 68 years old and has a known L5-S1 disc to the right side.  She says the injections help and she just taken some Aleve and she is doing much better overall.  She still has some pain when she sitting or laying down.  She is going to physical therapy as well as trying a home exercise program.  She is working on weight loss.  She denies any change in bowel bladder function.  She denies any weakness in her legs.  On exam she has negative straight leg raise to the right side and excellent strength in her bilateral extremities.  There is good sensation today as well.  At this point follow-up can be as needed.  She will adhere to her physical therapy and home exercise program.  If this worsens in any way she will let us know.  All questions concerns were answered and addressed.

## 2018-09-04 ENCOUNTER — Encounter: Payer: Self-pay | Admitting: Physical Therapy

## 2018-09-04 ENCOUNTER — Other Ambulatory Visit: Payer: Self-pay

## 2018-09-04 ENCOUNTER — Ambulatory Visit: Payer: Medicare Other | Admitting: Physical Therapy

## 2018-09-04 DIAGNOSIS — M545 Low back pain, unspecified: Secondary | ICD-10-CM

## 2018-09-04 DIAGNOSIS — M6281 Muscle weakness (generalized): Secondary | ICD-10-CM

## 2018-09-04 DIAGNOSIS — G8929 Other chronic pain: Secondary | ICD-10-CM

## 2018-09-04 DIAGNOSIS — R262 Difficulty in walking, not elsewhere classified: Secondary | ICD-10-CM

## 2018-09-04 NOTE — Therapy (Signed)
[place], [place], [address] Phone: [phone]   Fax:  [phone]  Physical Therapy Treatment  Patient Details  Name: Brittney Gray MRN: [id number] Date of Birth: 11-Aug-1950 Referring Provider (PT): Jean Rosenthal, MD   Encounter Date: 09/04/2018  PT End of Session - 09/04/18 0805    Visit Number  2    Number of Visits  6    Date for PT Re-Evaluation  09/25/18    Authorization Type  [employer] MCR- KX at visit 15    PN at visit 10    PT Start Time  0757    PT Stop Time  0835    PT Time Calculation (min)  38 min       Past Medical History:  Diagnosis Date  . Hypertension   . Obesity   . Pre-diabetes   . Vitamin D deficiency     Past Surgical History:  Procedure Laterality Date  . CARPAL TUNNEL RELEASE Right   . CESAREAN SECTION  1971  . KNEE ARTHROSCOPY Right   . TUBAL LIGATION      There were no vitals filed for this visit.  Subjective Assessment - 09/04/18 0804    Subjective  No pain, 1/10.    Currently in Pain?  No/denies    Pain Score  1     Pain Location  Back    Pain Orientation  Right    Pain Descriptors / Indicators  Aching    Aggravating Factors   laying on right side    Pain Relieving Factors  stretching, laying supine                       [facility] Adult PT Treatment/Exercise - 09/04/18 0001      Knee/Hip Exercises: Stretches   Active Hamstring Stretch  2 reps;20 seconds    Active Hamstring Stretch Limitations  supine with strap, has pain in R lateral hip with lowering     Passive Hamstring Stretch Limitations  seated     Piriformis Stretch Limitations  in seated & supine    Gastroc Stretch Limitations  standing at wall      Knee/Hip Exercises: Aerobic   Nustep  L5 UE/LE 5 minutes       Knee/Hip Exercises: Supine   Other Supine Knee/Hip Exercises  pelvic tilits , supine marching     Other Supine Knee/Hip Exercises  Clam red band x 20               PT Education - 09/04/18 0841    Education Details  HEP    Person(s) Educated  Patient    Methods  Explanation;Handout    Comprehension  Verbalized understanding       PT Short Term Goals - 08/20/18 1210      PT SHORT TERM GOAL #1   Title  STG=LTG        PT Long Term Goals - 08/20/18 1210      PT LONG TERM GOAL #1   Title  gross hip strength 5/5    Baseline  see flowsheet    Time  4    Period  Weeks    Status  New    Target Date  09/18/18      PT LONG TERM GOAL #2   Title  pt will be able to walk for at least 45 min for exercise without incr LBP    Baseline  unable to walk "a  long time" right now    Time  4    Period  Weeks    Status  New    Target Date  09/18/18      PT LONG TERM GOAL #3   Title  Pt will tolerate standing for ADLs with understanding of use of postures and stretches to decrease limitation by back    Baseline  will educate as appropriate    Time  4    Period  Weeks    Status  New    Target Date  09/18/18      PT LONG TERM GOAL #4   Title  FOTO to 28% limited    Baseline  38% limited    Time  4    Period  Weeks    Status  New    Target Date  09/18/18            Plan - 09/04/18 0816    Clinical Impression Statement  Pt reports walking 6 blocks 5 times since last visit. She is doing her stretches in sitting. We reviewed stretches and continues with hip and core activation, updated HEP. Some pilling noted in right posteriolateral hip with most exercises.    PT Next Visit Plan  traction PRN, gross strengthening & core activation, did she work on walking program?    PT Home Exercise Plan  HSS, figure 4, gastroc stretch, walking program short of painful distance., supine clam red band, PPT, Supine Marching       Patient will benefit from skilled therapeutic intervention in order to improve the following deficits and impairments:  Difficulty walking, Increased muscle spasms, Decreased activity tolerance, Pain, Improper body  mechanics, Decreased strength, Postural dysfunction  Visit Diagnosis: Chronic right-sided low back pain without sciatica  Difficulty in walking, not elsewhere classified  Muscle weakness (generalized)     Problem List Patient Active Problem List   Diagnosis Date Noted  . Lumbar radiculopathy 08/31/2018  . Situational anxiety 07/22/2018  . CKD stage 2 due to type 2 diabetes mellitus ([place]) 12/30/2017  . Medication management 04/12/2015  . Morbid obesity ([place]) 09/16/2013  . Type 2 diabetes with stage 2 chronic kidney disease GFR 60-89 ([place]) 01/07/2013  . Essential hypertension 01/03/2013  . Hyperlipidemia associated with type 2 diabetes mellitus ([place]) 01/03/2013  . Vitamin D deficiency 01/03/2013    Dorene Ar, PTA 09/04/2018, 8:44 AM  [facility] [address] [place], [place], [address] Phone: [phone]   Fax:  [phone]  Name: Brittney Gray MRN: [id number] Date of Birth: [birth year]

## 2018-09-11 ENCOUNTER — Other Ambulatory Visit: Payer: Self-pay

## 2018-09-11 ENCOUNTER — Ambulatory Visit: Payer: Medicare Other | Attending: Orthopaedic Surgery | Admitting: Physical Therapy

## 2018-09-11 ENCOUNTER — Encounter: Payer: Self-pay | Admitting: Physical Therapy

## 2018-09-11 DIAGNOSIS — R262 Difficulty in walking, not elsewhere classified: Secondary | ICD-10-CM

## 2018-09-11 DIAGNOSIS — M545 Low back pain, unspecified: Secondary | ICD-10-CM

## 2018-09-11 DIAGNOSIS — M6281 Muscle weakness (generalized): Secondary | ICD-10-CM | POA: Diagnosis present

## 2018-09-11 DIAGNOSIS — G8929 Other chronic pain: Secondary | ICD-10-CM | POA: Insufficient documentation

## 2018-09-11 NOTE — Therapy (Signed)
[place], [place], [address] Phone: [phone]   Fax:  [phone]  Physical Therapy Treatment  Patient Details  Name: Brittney Gray MRN: [id number] Date of Birth: [birth year] Referring Provider (PT): Jean Rosenthal, MD   Encounter Date: 09/11/2018  PT End of Session - 09/11/18 0811    Visit Number  3    Number of Visits  6    Date for PT Re-Evaluation  09/25/18    Authorization Type  UHC MCR- KX at visit 15    PN at visit 10    PT Start Time  0806    PT Stop Time  0853    PT Time Calculation (min)  47 min    Activity Tolerance  Patient tolerated treatment well    Behavior During Therapy  [facility] for tasks assessed/performed       Past Medical History:  Diagnosis Date  . Hypertension   . Obesity   . Pre-diabetes   . Vitamin D deficiency     Past Surgical History:  Procedure Laterality Date  . CARPAL TUNNEL RELEASE Right   . CESAREAN SECTION  1971  . KNEE ARTHROSCOPY Right   . TUBAL LIGATION      There were no vitals filed for this visit.  Subjective Assessment - 09/11/18 0808    Subjective  My back is feeling great but I still feel it out into my hip. Haven't had any calf cramping in a while.    How long can you sit comfortably?  90 min or so    Patient Stated Goals  decrease pain with standing, walking program for weight loss    Currently in Pain?  Yes    Pain Score  8    no visual signs of pain   Pain Location  Hip    Pain Orientation  Right    Pain Descriptors / Indicators  --   It's just there   Aggravating Factors   night time    Pain Relieving Factors  stretching         [facility] PT Assessment - 09/11/18 0001      Assessment   Medical Diagnosis  LBP    Referring Provider (PT)  Jean Rosenthal, MD                   [facility] Adult PT Treatment/Exercise - 09/11/18 0001      Knee/Hip Exercises: Stretches   Passive Hamstring Stretch Limitations  supine with  strap    Piriformis Stretch Limitations  supine      Knee/Hip Exercises: Aerobic   Nustep  L5 5 min UE & LE      Modalities   Modalities  Traction      Traction   Type of Traction  Lumbar    Min (lbs)  25    Max (lbs)  75    Hold Time  60    Rest Time  10    Time  10 min      Manual Therapy   Manual Therapy  Soft tissue mobilization    Soft tissue mobilization  Rt glut max/med/min, TFL             PT Education - 09/11/18 1013    Education Details  traction rationale & expected outcomes    Person(s) Educated  Patient    Methods  Explanation    Comprehension  Verbalized understanding       PT Short Term Goals -  08/20/18 1210      PT SHORT TERM GOAL #1   Title  STG=LTG        PT Long Term Goals - 08/20/18 1210      PT LONG TERM GOAL #1   Title  gross hip strength 5/5    Baseline  see flowsheet    Time  4    Period  Weeks    Status  New    Target Date  09/18/18      PT LONG TERM GOAL #2   Title  pt will be able to walk for at least 45 min for exercise without incr LBP    Baseline  unable to walk "a long time" right now    Time  4    Period  Weeks    Status  New    Target Date  09/18/18      PT LONG TERM GOAL #3   Title  Pt will tolerate standing for ADLs with understanding of use of postures and stretches to decrease limitation by back    Baseline  will educate as appropriate    Time  4    Period  Weeks    Status  New    Target Date  09/18/18      PT LONG TERM GOAL #4   Title  FOTO to 28% limited    Baseline  38% limited    Time  4    Period  Weeks    Status  New    Target Date  09/18/18            Plan - 09/11/18 [id number]    Clinical Impression Statement  Traction utilized today per PT order, Began at 75lb today and will progress as tolerated. Pt reported relief of concordant pain after manual therapy today. Encouraged her to continue walking and stretching as the muscles still need to decrease tension after injection calmed pain at  joint.    PT Treatment/Interventions  ADLs/Self Care Home Management;Cryotherapy;Electrical Stimulation;Ultrasound;Traction;Moist Heat;Iontophoresis 4mg /ml Dexamethasone;Gait training;Stair training;Functional mobility training;Therapeutic activities;Therapeutic exercise;Balance training;Patient/family education;Neuromuscular re-education;Manual techniques;Passive range of motion;Dry needling;Spinal Manipulations;Taping    PT Next Visit Plan  outcome of traction? core strengthening    PT Home Exercise Plan  HSS, figure 4, gastroc stretch, walking program short of painful distance., supine clam red band, PPT, Supine Marching    Consulted and Agree with Plan of Care  Patient       Patient will benefit from skilled therapeutic intervention in order to improve the following deficits and impairments:  Difficulty walking, Increased muscle spasms, Decreased activity tolerance, Pain, Improper body mechanics, Decreased strength, Postural dysfunction  Visit Diagnosis: Chronic right-sided low back pain without sciatica  Difficulty in walking, not elsewhere classified  Muscle weakness (generalized)     Problem List Patient Active Problem List   Diagnosis Date Noted  . Lumbar radiculopathy 08/31/2018  . Situational anxiety 07/22/2018  . CKD stage 2 due to type 2 diabetes mellitus ([place]) 12/30/2017  . Medication management 04/12/2015  . Morbid obesity ([place]) 09/16/2013  . Type 2 diabetes with stage 2 chronic kidney disease GFR 60-89 ([place]) 01/07/2013  . Essential hypertension 01/03/2013  . Hyperlipidemia associated with type 2 diabetes mellitus ([place]) 01/03/2013  . Vitamin D deficiency 01/03/2013    Antha Niday C. Avarie Tavano PT, DPT 09/11/18 10:14 AM   [place] [facility] [address] [place], [place], [address] Phone: [phone]   Fax:  [phone]  Name: Lynzie Sark MRN: [id number]  Date of Birth: 1950-08-06

## 2018-09-18 ENCOUNTER — Ambulatory Visit: Payer: Medicare Other | Admitting: Physical Therapy

## 2018-09-18 ENCOUNTER — Other Ambulatory Visit: Payer: Self-pay

## 2018-09-18 ENCOUNTER — Encounter: Payer: Self-pay | Admitting: Physical Therapy

## 2018-09-18 DIAGNOSIS — M545 Low back pain, unspecified: Secondary | ICD-10-CM

## 2018-09-18 DIAGNOSIS — R262 Difficulty in walking, not elsewhere classified: Secondary | ICD-10-CM

## 2018-09-18 DIAGNOSIS — M6281 Muscle weakness (generalized): Secondary | ICD-10-CM

## 2018-09-18 DIAGNOSIS — G8929 Other chronic pain: Secondary | ICD-10-CM

## 2018-09-18 NOTE — Therapy (Signed)
[place], [place], [address] Phone: [phone]   Fax:  [phone]  Physical Therapy Treatment  Patient Details  Name: Brittney Gray MRN: [id number] Date of Birth: [birth year] Referring Provider (PT): Jean Rosenthal, MD   Encounter Date: 09/18/2018  PT End of Session - 09/18/18 0834    Visit Number  4    Number of Visits  6    Date for PT Re-Evaluation  09/25/18    Authorization Type  UHC MCR- KX at visit 15    PN at visit 10    PT Start Time  0756    PT Stop Time  0845    PT Time Calculation (min)  49 min       Past Medical History:  Diagnosis Date  . Hypertension   . Obesity   . Pre-diabetes   . Vitamin D deficiency     Past Surgical History:  Procedure Laterality Date  . CARPAL TUNNEL RELEASE Right   . CESAREAN SECTION  1971  . KNEE ARTHROSCOPY Right   . TUBAL LIGATION      There were no vitals filed for this visit.  Subjective Assessment - 09/18/18 0801    Subjective  My hip bone still hurts. Hurts more when I am still. Once I get moving I do not feel it as much.    Currently in Pain?  No/denies    Aggravating Factors   sitting or laying down    Pain Relieving Factors  moving stretching                       [facility] Adult PT Treatment/Exercise - 09/18/18 0001      Knee/Hip Exercises: Stretches   Active Hamstring Stretch  2 reps;20 seconds    Active Hamstring Stretch Limitations  supine with strap, has pain in R lateral hip with lowering     ITB Stretch Limitations  supine with strap     Piriformis Stretch Limitations  supine push and pull      Knee/Hip Exercises: Aerobic   Nustep  L5 5 min UE & LE      Knee/Hip Exercises: Supine   Bridges  10 reps    Bridges with Clamshell  10 reps   green band hold   Other Supine Knee/Hip Exercises  pelvic tilits , supine marching     Other Supine Knee/Hip Exercises  Clam green band x 20       Knee/Hip Exercises:  Sidelying   Clams  x 10 green, reverse x 10       Traction   Type of Traction  Lumbar    Min (lbs)  25    Max (lbs)  75    Hold Time  60    Rest Time  10    Time  10 min      Manual Therapy   Soft tissue mobilization  Rt glut max/med/min, TFL               PT Short Term Goals - 08/20/18 1210      PT SHORT TERM GOAL #1   Title  STG=LTG        PT Long Term Goals - 08/20/18 1210      PT LONG TERM GOAL #1   Title  gross hip strength 5/5    Baseline  see flowsheet    Time  4    Period  Weeks    Status  New    Target Date  09/18/18      PT LONG TERM GOAL #2   Title  pt will be able to walk for at least 45 min for exercise without incr LBP    Baseline  unable to walk "a long time" right now    Time  4    Period  Weeks    Status  New    Target Date  09/18/18      PT LONG TERM GOAL #3   Title  Pt will tolerate standing for ADLs with understanding of use of postures and stretches to decrease limitation by back    Baseline  will educate as appropriate    Time  4    Period  Weeks    Status  New    Target Date  09/18/18      PT LONG TERM GOAL #4   Title  FOTO to 28% limited    Baseline  38% limited    Time  4    Period  Weeks    Status  New    Target Date  09/18/18            Plan - 09/18/18 0835    Clinical Impression Statement  Pt reports feeling a little better after traction. She reports most pain in right posterior hip with sitting and laying. She is tenderto palpation. Manual used to decrease soft tissue tenderness. Repeated traction as pt reported it was helpful last session. Continued with hip and core strength. Pulling sensation reported from posterior hip to knee with therex.    PT Next Visit Plan  outcome of traction? core strengthening    PT Home Exercise Plan  HSS, figure 4, gastroc stretch, walking program short of painful distance., supine clam red band, PPT, Supine Marching       Patient will benefit from skilled therapeutic  intervention in order to improve the following deficits and impairments:  Difficulty walking, Increased muscle spasms, Decreased activity tolerance, Pain, Improper body mechanics, Decreased strength, Postural dysfunction  Visit Diagnosis: Chronic right-sided low back pain without sciatica  Difficulty in walking, not elsewhere classified  Muscle weakness (generalized)     Problem List Patient Active Problem List   Diagnosis Date Noted  . Lumbar radiculopathy 08/31/2018  . Situational anxiety 07/22/2018  . CKD stage 2 due to type 2 diabetes mellitus ([place]) 12/30/2017  . Medication management 04/12/2015  . Morbid obesity ([place]) 09/16/2013  . Type 2 diabetes with stage 2 chronic kidney disease GFR 60-89 ([place]) 01/07/2013  . Essential hypertension 01/03/2013  . Hyperlipidemia associated with type 2 diabetes mellitus ([place]) 01/03/2013  . Vitamin D deficiency 01/03/2013    Dorene Ar, PTA 09/18/2018, 8:38 AM  [facility] [address] [place], [place], [address] Phone: [phone]   Fax:  [phone]  Name: Marchelle Huish MRN: [id number] Date of Birth: Jan 18, 1950

## 2018-09-29 ENCOUNTER — Other Ambulatory Visit: Payer: Self-pay

## 2018-09-29 ENCOUNTER — Ambulatory Visit: Payer: Medicare Other | Admitting: Physical Therapy

## 2018-09-29 ENCOUNTER — Encounter: Payer: Self-pay | Admitting: Physical Therapy

## 2018-09-29 DIAGNOSIS — M6281 Muscle weakness (generalized): Secondary | ICD-10-CM

## 2018-09-29 DIAGNOSIS — M545 Low back pain, unspecified: Secondary | ICD-10-CM

## 2018-09-29 DIAGNOSIS — G8929 Other chronic pain: Secondary | ICD-10-CM

## 2018-09-29 DIAGNOSIS — R262 Difficulty in walking, not elsewhere classified: Secondary | ICD-10-CM

## 2018-09-29 NOTE — Therapy (Addendum)
[place], [place], [address] Phone: [phone]   Fax:  [phone]  Physical Therapy Treatment  Patient Details  Name: Brittney Gray MRN: [id number] Date of Birth: 02/09/1950 Referring Provider (PT): Jean Rosenthal, MD   Encounter Date: 09/29/2018  PT End of Session - 09/29/18 0805    Visit Number  5    Number of Visits  10    Date for PT Re-Evaluation  10/27/18    Authorization Type  [employer]HC MCR- KX at visit 15    PN at visit 10    PT Start Time  0802    PT Stop Time  0845    PT Time Calculation (min)  43 min       Past Medical History:  Diagnosis Date  . Hypertension   . Obesity   . Pre-diabetes   . Vitamin D deficiency     Past Surgical History:  Procedure Laterality Date  . CARPAL TUNNEL RELEASE Right   . CESAREAN SECTION  1971  . KNEE ARTHROSCOPY Right   . TUBAL LIGATION      There were no vitals filed for this visit.  Subjective Assessment - 09/29/18 0803    Subjective  The pain is not getting better in the hip. I am doing the majority of the exercises.    Currently in Pain?  Yes    Pain Score  4     Pain Location  Hip    Pain Orientation  Right    Aggravating Factors   laying down, sitting to watch TV, getting up in the morning.    Pain Relieving Factors  get moving         [facility] PT Assessment - 09/29/18 0001      Observation/Other Assessments   Focus on Therapeutic Outcomes (FOTO)   44% limited      Strength   Right Hip Flexion  4+/5    Right Hip ABduction  4/5    Left Hip Flexion  4+/5    Left Hip ABduction  4/5      Palpation   Palpation comment  tender right piriformis/gluteals                   [facility] Adult PT Treatment/Exercise - 09/29/18 0001      Self-Care   Self-Care  Other Self-Care Comments    Other Self-Care Comments   Tennis ball in sitting, supine to gluteal/piriformis with hip AROM, figure 4       Knee/Hip Exercises: Stretches   Active Hamstring Stretch  2 reps;20 seconds    Active Hamstring Stretch Limitations  supine with strap, has pain in R lateral hip with lowering     ITB Stretch Limitations  supine with strap     Piriformis Stretch Limitations  supine push and pull      Knee/Hip Exercises: Aerobic   Nustep  L5 5 min UE & LE      Knee/Hip Exercises: Supine   Bridges with [employer]  10 reps    Bridges with Clamshell  10 reps   green band hold   Other Supine Knee/Hip Exercises  pelvic tilits , supine marching       Knee/Hip Exercises: Sidelying   Hip ABduction Limitations  13 left , x 10 right     Clams  x 20 green right , reverse clam AROM on right       Manual Therapy   Manual therapy comments  R  LAD     Soft tissue mobilization  Rt piriformis, glut med , massage roller              PT Education - 09/29/18 0924    Education Details  HEP    Person(s) Educated  Patient    Methods  Explanation;Handout    Comprehension  Verbalized understanding       PT Short Term Goals - 08/20/18 1210      PT SHORT TERM GOAL #1   Title  STG=LTG        PT Long Term Goals - 09/29/18 0900      PT LONG TERM GOAL #1   Title  gross hip strength 5/5    Baseline  see flowsheet    Time  4    Period  Weeks    Status  On-going    Target Date  10/27/18      PT LONG TERM GOAL #2   Title  pt will be able to walk for at least 45 min for exercise without incr LBP    Baseline  limitied to 20 minutes    Time  4    Period  Weeks    Status  On-going    Target Date  10/27/18      PT LONG TERM GOAL #3   Title  Pt will tolerate standing for ADLs with understanding of use of postures and stretches to decrease limitation by back    Baseline  will educate as appropriate, pain with 20 minutes standing    Time  4    Period  Weeks    Status  On-going    Target Date  10/27/18      PT LONG TERM GOAL #4   Title  FOTO to 28% limited    Baseline  38% limited, 44% on status    Time  4    Period  Weeks     Status  On-going    Target Date  10/27/18            Plan - 09/29/18 0813    Clinical Impression Statement  Pt reports walking for 20 minutes and then hip pain begins to increase. 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 She reports her pain intensity has improved however it is still present. Her hip strength has improved. She is very tender in right gluteals and piriformis. She would benefit from extension of POC to address remaining  deficits.    PT Frequency  1x / week    PT Duration  4 weeks    PT Treatment/Interventions  ADLs/Self Care Home Management;Cryotherapy;Electrical Stimulation;Ultrasound;Traction;Moist Heat;Iontophoresis 4mg /ml Dexamethasone;Gait training;Stair training;Functional mobility training;Therapeutic activities;Therapeutic exercise;Balance training;Patient/family education;Neuromuscular re-education;Manual techniques;Passive range of motion;Dry needling;Spinal Manipulations;Taping    PT Next Visit Plan  core/hip strengthening, soft tissue work to right gluteals/piriformis    PT Home Exercise Plan  HSS, figure 4, gastroc stretch, walking program short of painful distance., supine clam red band, PPT, Supine Marching, side hip abduction, knee to opp shoulder, side clam green band       Patient will benefit from skilled therapeutic intervention in order to improve the following deficits and impairments:  Difficulty walking, Increased muscle spasms, Decreased activity tolerance, Pain, Improper body mechanics, Decreased strength, Postural dysfunction  Visit Diagnosis: Chronic right-sided low back pain without sciatica  Difficulty in walking, not elsewhere classified  Muscle weakness (generalized)     Problem List Patient Active Problem  List   Diagnosis Date Noted  . Lumbar radiculopathy 08/31/2018  . Situational anxiety 07/22/2018  . CKD stage 2 due to type 2 diabetes mellitus ([place]) 12/30/2017  . Medication  management 04/12/2015  . Morbid obesity ([place]) 09/16/2013  . Type 2 diabetes with stage 2 chronic kidney disease GFR 60-89 ([place]) 01/07/2013  . Essential hypertension 01/03/2013  . Hyperlipidemia associated with type 2 diabetes mellitus ([place]) 01/03/2013  . Vitamin D deficiency 01/03/2013    Dorene Ar, PTA 09/29/2018, 9:25 AM  [facility] [address] [place], [place], [address] Phone: [phone]   Fax:  [phone]  Name: Brittney Gray MRN: [id number] Date of Birth: 09/12/1950

## 2018-09-29 NOTE — Addendum Note (Signed)
Addended by: Jonna Coup on: 09/29/2018 06:26 PM   Modules accepted: Orders

## 2018-10-05 ENCOUNTER — Ambulatory Visit: Payer: Medicare Other | Admitting: Physical Therapy

## 2018-10-05 ENCOUNTER — Other Ambulatory Visit: Payer: Self-pay

## 2018-10-05 DIAGNOSIS — G8929 Other chronic pain: Secondary | ICD-10-CM

## 2018-10-05 DIAGNOSIS — M545 Low back pain, unspecified: Secondary | ICD-10-CM

## 2018-10-05 DIAGNOSIS — M6281 Muscle weakness (generalized): Secondary | ICD-10-CM

## 2018-10-05 DIAGNOSIS — R262 Difficulty in walking, not elsewhere classified: Secondary | ICD-10-CM

## 2018-10-05 NOTE — Therapy (Signed)
Brittney Gray, [place], [address] Phone: [phone]   Fax:  [phone]  Physical Therapy Treatment  Patient Details  Name: Brittney Gray MRN: [id number] Date of Birth: 03-Sep-1950 Referring Provider (PT): Jean Rosenthal, MD   Encounter Date: 10/05/2018  PT End of Session - 10/05/18 0818    Visit Number  6    Number of Visits  10    Date for PT Re-Evaluation  10/27/18    Authorization Type  [employer] MCR- KX at visit 15    PN at visit 10    PT Start Time  0800    PT Stop Time  0838    PT Time Calculation (min)  38 min       Past Medical History:  Diagnosis Date  . Hypertension   . Obesity   . Pre-diabetes   . Vitamin D deficiency     Past Surgical History:  Procedure Laterality Date  . CARPAL TUNNEL RELEASE Right   . CESAREAN SECTION  1971  . KNEE ARTHROSCOPY Right   . TUBAL LIGATION      There were no vitals filed for this visit.  Subjective Assessment - 10/05/18 0809    Currently in Pain?  Yes    Pain Score  5    up to 9/10   Pain Location  Hip    Pain Orientation  Right    Pain Descriptors / Indicators  Dull    Aggravating Factors   sitting                       [facility] Adult PT Treatment/Exercise - 10/05/18 0001      Knee/Hip Exercises: Stretches   Piriformis Stretch Limitations  seated push and pull      Knee/Hip Exercises: Aerobic   Nustep  L5 5 min UE & LE      Knee/Hip Exercises: Supine   Bridges  20 reps    Straight Leg Raises  10 reps    Straight Leg Raises Limitations  pulling and pain in right groin       Knee/Hip Exercises: Sidelying   Hip ABduction Limitations  left x 16, right x 6 plus 4 more     Clams  x 20 green right , reverse clam AROM on right       Manual Therapy   Soft tissue mobilization  Prone tack and stretch with PROM Hip IR/ER, trigger point release with tennis ball x 3 right piriformis                PT Short Term Goals -  08/20/18 1210      PT SHORT TERM GOAL #1   Title  STG=LTG        PT Long Term Goals - 09/29/18 0900      PT LONG TERM GOAL #1   Title  gross hip strength 5/5    Baseline  see flowsheet    Time  4    Period  Weeks    Status  On-going    Target Date  10/27/18      PT LONG TERM GOAL #2   Title  pt will be able to walk for at least 45 min for exercise without incr LBP    Baseline  limitied to 20 minutes    Time  4    Period  Weeks    Status  On-going    Target Date  10/27/18  PT LONG TERM GOAL #3   Title  Pt will tolerate standing for ADLs with understanding of use of postures and stretches to decrease limitation by back    Baseline  will educate as appropriate, pain with 20 minutes standing    Time  4    Period  Weeks    Status  On-going    Target Date  10/27/18      PT LONG TERM GOAL #4   Title  FOTO to 28% limited    Baseline  38% limited, 44% on status    Time  4    Period  Weeks    Status  On-going    Target Date  10/27/18            Plan - 10/05/18 0843    Clinical Impression Statement  Pt arrives with 5/10 hip pain and reports she is still having most dificulty with sitting. She has increased dificulty with therex today complaining of increased hip pain and groin pain with reps.She alos reports  pain radiating down leg and into calf. Prone manual soft tissue adn PROM performed to right hip. Attempted Lumbar traction today however patient could not tolerate lying down so discontinued for today. She plans to call Dr Ninfa Linden for follow up.    PT Next Visit Plan  did she schedule MD follow up? core/hip strength, soft tissue right hip.    PT Home Exercise Plan  HSS, figure 4, gastroc stretch, walking program short of painful distance., supine clam red band, PPT, Supine Marching, side hip abduction, knee to opp shoulder, side clam green band       Patient will benefit from skilled therapeutic intervention in order to improve the following deficits and  impairments:  Difficulty walking, Increased muscle spasms, Decreased activity tolerance, Pain, Improper body mechanics, Decreased strength, Postural dysfunction  Visit Diagnosis: Chronic right-sided low back pain without sciatica  Difficulty in walking, not elsewhere classified  Muscle weakness (generalized)     Problem List Patient Active Problem List   Diagnosis Date Noted  . Lumbar radiculopathy 08/31/2018  . Situational anxiety 07/22/2018  . CKD stage 2 due to type 2 diabetes mellitus ([place]) 12/30/2017  . Medication management 04/12/2015  . Morbid obesity ([place]) 09/16/2013  . Type 2 diabetes with stage 2 chronic kidney disease GFR 60-89 ([place]) 01/07/2013  . Essential hypertension 01/03/2013  . Hyperlipidemia associated with type 2 diabetes mellitus ([place]) 01/03/2013  . Vitamin D deficiency 01/03/2013    Dorene Ar, PTA 10/05/2018, 8:51 AM  [facility] [address] [place], [place], [address] Phone: [phone]   Fax:  [phone]  Name: Brittney Gray MRN: [id number] Date of Birth: September 10, 1950

## 2018-10-08 ENCOUNTER — Encounter: Payer: Self-pay | Admitting: Orthopaedic Surgery

## 2018-10-08 ENCOUNTER — Ambulatory Visit (INDEPENDENT_AMBULATORY_CARE_PROVIDER_SITE_OTHER): Payer: Medicare Other | Admitting: Orthopaedic Surgery

## 2018-10-08 DIAGNOSIS — M25551 Pain in right hip: Secondary | ICD-10-CM | POA: Diagnosis not present

## 2018-10-08 MED ORDER — METHYLPREDNISOLONE ACETATE 40 MG/ML IJ SUSP
40.0000 mg | INTRAMUSCULAR | Status: AC | PRN
  Administered 2018-10-08: 40 mg via INTRA_ARTICULAR

## 2018-10-08 MED ORDER — LIDOCAINE HCL 1 % IJ SOLN
3.0000 mL | INTRAMUSCULAR | Status: AC | PRN
  Administered 2018-10-08: 3 mL

## 2018-10-08 NOTE — Progress Notes (Signed)
Office Visit Note   Patient: Brittney Gray           Date of Birth: Feb 02, 1950           MRN: [id number] Visit Date: 10/08/2018              Requested by: Unk Pinto, [place] [address] [place],  [place] [address] PCP: Unk Pinto, MD   Assessment & Plan: Visit Diagnoses:  1. Pain in right hip     Plan: I gave her reassurance that this is definitely not her hip and is more of her posterior pelvis area and sciatic area.  I did provide at least a trigger point injection in the posterior pelvis area where she is most point tender and she states that she felt better after the injection.  At this point I do feel that she can stop physical therapy and we can see her back in about a month to see if she is doing well.  I would certainly recommend a repeat injection by Dr. Ernestina Patches if she is not having progress.  All question concerns were answered and addressed.  Follow-Up Instructions: Return in about 4 weeks (around 11/05/2018).   Orders:  No orders of the defined types were placed in this encounter.  No orders of the defined types were placed in this encounter.     Procedures: Large Joint Inj: R greater trochanter on 10/08/2018 2:35 PM Indications: pain and diagnostic evaluation Details: 22 G 1.5 in needle, lateral approach  Arthrogram: No  Medications: 3 mL lidocaine 1 %; 40 mg methylPREDNISolone acetate 40 MG/ML Outcome: tolerated well, no immediate complications Procedure, treatment alternatives, risks and benefits explained, specific risks discussed. Consent was given by the patient. Immediately prior to procedure a time out was called to verify the correct patient, procedure, equipment, support staff and site/side marked as required. Patient was prepped and draped in the usual sterile fashion.       Clinical Data: No additional findings.   Subjective: Chief Complaint  Patient presents with  . Right Hip - Follow-up  The patient is still having  complaints of right-sided sciatica.  She did have an L5-S1 injection to the right side by Dr. Ernestina Patches in August of this year.  She is also been to physical therapy.  She still reports good days and bad days.  She said the injection has worn off.  She is feeling this is not her back as it is her hip but she points to the sciatic area as a source of her pain.  She denies any numbness and tingling in her legs and denies any weakness but the pain does radiate from her sciatic region to sometimes behind her knee.  She denies any change in bowel bladder function either.  HPI  Review of Systems She currently denies any headache, chest pain, shortness of breath, fever, chills, nausea, vomiting  Objective: Vital Signs: There were no vitals taken for this visit.  Physical Exam She is alert and orient x3 and in no acute distress Ortho Exam Examination of her right hip is normal in terms of full and fluid range of motion of her hip with no pain in the groin on the right or left sides.  There is no pain of the trochanteric area of the IT band.  There is pain to palpation of the posterior pelvis around the SI joint but also just the posterior pelvis itself. Specialty Comments:  No specialty comments available.  Imaging: No  results found. Previous pelvis and hip films were reviewed with her and it shows a well maintained right joint with normal joint space and no significant arthritic findings or acute findings.  PMFS History: Patient Active Problem List   Diagnosis Date Noted  . Lumbar radiculopathy 08/31/2018  . Situational anxiety 07/22/2018  . CKD stage 2 due to type 2 diabetes mellitus ([place]) 12/30/2017  . Medication management 04/12/2015  . Morbid obesity ([place]) 09/16/2013  . Type 2 diabetes with stage 2 chronic kidney disease GFR 60-89 ([place]) 01/07/2013  . Essential hypertension 01/03/2013  . Hyperlipidemia associated with type 2 diabetes mellitus ([place]) 01/03/2013  . Vitamin D deficiency  01/03/2013   Past Medical History:  Diagnosis Date  . Hypertension   . Obesity   . Pre-diabetes   . Vitamin D deficiency     Family History  Problem Relation Age of Onset  . Stroke Mother   . Hypertension Sister   . Thyroid disease Sister   . Aneurysm Sister   . Heart disease Brother   . Diabetes Brother   . Heart disease Brother   . Diabetes Brother     Past Surgical History:  Procedure Laterality Date  . CARPAL TUNNEL RELEASE Right   . CESAREAN SECTION  1971  . KNEE ARTHROSCOPY Right   . TUBAL LIGATION     Social History   Occupational History  . Occupation: Oceanographer  . Occupation: Retired  Tobacco Use  . Smoking status: Never Smoker  . Smokeless tobacco: Never Used  Substance and Sexual Activity  . Alcohol use: Yes    Comment: Rarely; socially  . Drug use: No  . Sexual activity: Not Currently

## 2018-10-12 ENCOUNTER — Ambulatory Visit: Payer: Medicare Other | Attending: Orthopaedic Surgery | Admitting: Physical Therapy

## 2018-10-12 ENCOUNTER — Other Ambulatory Visit: Payer: Self-pay

## 2018-10-12 ENCOUNTER — Encounter: Payer: Self-pay | Admitting: Physical Therapy

## 2018-10-12 DIAGNOSIS — G8929 Other chronic pain: Secondary | ICD-10-CM | POA: Diagnosis present

## 2018-10-12 DIAGNOSIS — M545 Low back pain, unspecified: Secondary | ICD-10-CM

## 2018-10-12 DIAGNOSIS — R262 Difficulty in walking, not elsewhere classified: Secondary | ICD-10-CM

## 2018-10-12 DIAGNOSIS — M6281 Muscle weakness (generalized): Secondary | ICD-10-CM

## 2018-10-12 NOTE — Therapy (Addendum)
[place], [place], [address] Phone: [phone]   Fax:  [phone]  Physical Therapy Treatment/Discharge  Patient Details  Name: Brittney Gray MRN: [id number] Date of Birth: 05-Aug-1950 Referring Provider (PT): Jean Rosenthal, MD   Encounter Date: 10/12/2018  PT End of Session - 10/12/18 0803    Visit Number  7    Number of Visits  10    Date for PT Re-Evaluation  10/27/18    Authorization Type  [employer] MCR- KX at visit 15    PN at visit 10    PT Start Time  0800    PT Stop Time  0845    PT Time Calculation (min)  45 min       Past Medical History:  Diagnosis Date  . Hypertension   . Obesity   . Pre-diabetes   . Vitamin D deficiency     Past Surgical History:  Procedure Laterality Date  . CARPAL TUNNEL RELEASE Right   . CESAREAN SECTION  1971  . KNEE ARTHROSCOPY Right   . TUBAL LIGATION      There were no vitals filed for this visit.  Subjective Assessment - 10/12/18 0804    Subjective  Just a little pain this morning. I was hurting yesterday and was having pain with shopping.    Currently in Pain?  Yes    Pain Score  7     Pain Location  Hip    Pain Orientation  Right    Pain Descriptors / Indicators  Dull    Aggravating Factors   sitting, prolonged activity on feet    Pain Relieving Factors  get moving.                       [facility] Adult PT Treatment/Exercise - 10/12/18 0001      Knee/Hip Exercises: Stretches   Active Hamstring Stretch  2 reps;20 seconds    Active Hamstring Stretch Limitations  supine with strap, has pain in R lateral hip with lowering     Piriformis Stretch Limitations  seated push and pull      Knee/Hip Exercises: Aerobic   Nustep  L5 5 min UE & LE      Knee/Hip Exercises: Standing   Functional Squat Limitations  squat at counter x 10     Other Standing Knee Exercises  3 way hip standing       Knee/Hip Exercises: Seated   Sit to Sand  10  reps      Knee/Hip Exercises: Sidelying   Clams  x 20       Modalities   Modalities  Electrical Stimulation;Moist Heat      Moist Heat Therapy   Number Minutes Moist Heat  10 Minutes    Moist Heat Location  Hip      Electrical Stimulation   Electrical Stimulation Location  Posterior right hip    Electrical Stimulation Action  IFC x 10 minutes     Electrical Stimulation Parameters  9 mA    Electrical Stimulation Goals  Pain      Manual Therapy   Manual therapy comments  R LAD     Soft tissue mobilization  Rt piriformis, glut med , massage roller              PT Education - 10/12/18 0905    Education Details  TPDN    Person(s) Educated  Patient    Methods  Explanation;Handout  Comprehension  Verbalized understanding       PT Short Term Goals - 08/20/18 1210      PT SHORT TERM GOAL #1   Title  STG=LTG        PT Long Term Goals - 09/29/18 0900      PT LONG TERM GOAL #1   Title  gross hip strength 5/5    Baseline  see flowsheet    Time  4    Period  Weeks    Status  On-going    Target Date  10/27/18      PT LONG TERM GOAL #2   Title  pt will be able to walk for at least 45 min for exercise without incr LBP    Baseline  limitied to 20 minutes    Time  4    Period  Weeks    Status  On-going    Target Date  10/27/18      PT LONG TERM GOAL #3   Title  Pt will tolerate standing for ADLs with understanding of use of postures and stretches to decrease limitation by back    Baseline  will educate as appropriate, pain with 20 minutes standing    Time  4    Period  Weeks    Status  On-going    Target Date  10/27/18      PT LONG TERM GOAL #4   Title  FOTO to 28% limited    Baseline  38% limited, 44% on status    Time  4    Period  Weeks    Status  On-going    Target Date  10/27/18            Plan - 10/12/18 3646    Clinical Impression Statement  Pt saw MD last week who gave her a hip injection. She reports no relief. She is frustrated with her  lack of improvement. Her pain is located posterior hip and very tender to palpation. After massage roller to hip, she reported decreased pain. Increased pain with all stretches and therex. Trial of IFC with HMP and she reported feeling a littel better post session. TPDN handout given for her consider this treatment at her next session.    PT Next Visit Plan  core/hip strength, soft tissue right hip. Consider TPDN next session.    PT Home Exercise Plan  HSS, figure 4, gastroc stretch, walking program short of painful distance., supine clam red band, PPT, Supine Marching, side hip abduction, knee to opp shoulder, side clam green band       Patient will benefit from skilled therapeutic intervention in order to improve the following deficits and impairments:  Difficulty walking, Increased muscle spasms, Decreased activity tolerance, Pain, Improper body mechanics, Decreased strength, Postural dysfunction  Visit Diagnosis: Chronic right-sided low back pain without sciatica  Difficulty in walking, not elsewhere classified  Muscle weakness (generalized)     Problem List Patient Active Problem List   Diagnosis Date Noted  . Lumbar radiculopathy 08/31/2018  . Situational anxiety 07/22/2018  . CKD stage 2 due to type 2 diabetes mellitus ([place]) 12/30/2017  . Medication management 04/12/2015  . Morbid obesity ([place]) 09/16/2013  . Type 2 diabetes with stage 2 chronic kidney disease GFR 60-89 ([place]) 01/07/2013  . Essential hypertension 01/03/2013  . Hyperlipidemia associated with type 2 diabetes mellitus ([place]) 01/03/2013  . Vitamin D deficiency 01/03/2013    Dorene Ar, PTA 10/12/2018, 9:11 AM  [facility], [place], [address] Phone: [phone]   Fax:  [phone]  Name: Brittney Gray MRN: [id number] Date of Birth: [birth year]  PHYSICAL THERAPY DISCHARGE SUMMARY  Visits from Start of Care: 7  Current  functional level related to goals / functional outcomes: See above   Remaining deficits: See above   Education / Equipment: Anatomy of condition, POC, HEP, exercise form/rationale  Plan: Patient agrees to discharge.  Patient goals were not met. Patient is being discharged due to not returning since the last visit.  ?????     Brittney Gray C. Hightower PT, DPT 11/10/18 12:59 PM

## 2018-10-12 NOTE — Patient Instructions (Signed)

## 2018-10-14 ENCOUNTER — Ambulatory Visit: Payer: Self-pay | Admitting: Physician Assistant

## 2018-10-20 ENCOUNTER — Ambulatory Visit: Payer: Medicare Other | Admitting: Physical Therapy

## 2018-10-27 ENCOUNTER — Ambulatory Visit: Payer: Medicare Other | Admitting: Physical Therapy

## 2018-11-03 ENCOUNTER — Ambulatory Visit: Payer: Medicare Other | Admitting: Physical Therapy

## 2018-11-05 ENCOUNTER — Encounter: Payer: Self-pay | Admitting: Orthopaedic Surgery

## 2018-11-05 ENCOUNTER — Other Ambulatory Visit: Payer: Self-pay

## 2018-11-05 ENCOUNTER — Ambulatory Visit (INDEPENDENT_AMBULATORY_CARE_PROVIDER_SITE_OTHER): Payer: Medicare Other | Admitting: Orthopaedic Surgery

## 2018-11-05 DIAGNOSIS — M25551 Pain in right hip: Secondary | ICD-10-CM

## 2018-11-05 DIAGNOSIS — M5431 Sciatica, right side: Secondary | ICD-10-CM | POA: Diagnosis not present

## 2018-11-05 NOTE — Progress Notes (Signed)
Office Visit Note   Patient: Brittney Gray           Date of Birth: Jan 09, 1950           MRN: [id number] Visit Date: 11/05/2018              Requested by: Unk Pinto, [place] [address],  [place] [address] PCP: Unk Pinto, MD   Assessment & Plan: Visit Diagnoses:  1. Pain in right hip   2. Sciatica, right side     Plan: Due to the fact that she continues to have pain in the point her SI joint region will try and SI joint injection.  Also be done by Dr. Ernestina Patches.  We will see her back month after the injection to see what type of response she had.  Questions were encouraged and answered by Dr. Ninfa Linden and myself.  Follow-Up Instructions: No follow-ups on file.   Orders:  No orders of the defined types were placed in this encounter.  No orders of the defined types were placed in this encounter.     Procedures: No procedures performed   Clinical Data: No additional findings.   Subjective: Chief Complaint  Patient presents with  . Right Hip - Follow-up    HPI Brittney Gray returns today complaining of right hip pain.  She still having some right sciatic-like pain but states this is overall improved.  States her hip pain is about 70% better after having a posterior trach injection by Dr. Ninfa Linden.  She felt physical therapy did not help at all.  Again she had L5-S1 injections by Dr. Ernestina Patches on the right side in August of this year and states that these gave her minimal relief.  She denies any waking pain.  Denies any change in bowel bladder function. Review of Systems See HPI otherwise negative or noncontributory.  Objective: Vital Signs: There were no vitals taken for this visit.  Physical Exam Constitutional:      Appearance: She is not ill-appearing.  Pulmonary:     Effort: Pulmonary effort is normal.  Neurological:     Mental Status: She is alert and oriented to person, place, and time.     Ortho Exam Right hip good range of  motion with internal and external rotation.  She has no tenderness of the IT band.  No significant tenderness over the trochanteric region.  She has tenderness over the SI joint region and lower lumbar spinal column with palpation..  Faber's causes pain right posterior hip.  Specialty Comments:  No specialty comments available.  Imaging: No results found.   PMFS History: Patient Active Problem List   Diagnosis Date Noted  . Lumbar radiculopathy 08/31/2018  . Situational anxiety 07/22/2018  . CKD stage 2 due to type 2 diabetes mellitus ([place]) 12/30/2017  . Medication management 04/12/2015  . Morbid obesity ([place]) 09/16/2013  . Type 2 diabetes with stage 2 chronic kidney disease GFR 60-89 ([place]) 01/07/2013  . Essential hypertension 01/03/2013  . Hyperlipidemia associated with type 2 diabetes mellitus ([place]) 01/03/2013  . Vitamin D deficiency 01/03/2013   Past Medical History:  Diagnosis Date  . Hypertension   . Obesity   . Pre-diabetes   . Vitamin D deficiency     Family History  Problem Relation Age of Onset  . Stroke Mother   . Hypertension Sister   . Thyroid disease Sister   . Aneurysm Sister   . Heart disease Brother   . Diabetes Brother   .  Heart disease Brother   . Diabetes Brother     Past Surgical History:  Procedure Laterality Date  . CARPAL TUNNEL RELEASE Right   . CESAREAN SECTION  1971  . KNEE ARTHROSCOPY Right   . TUBAL LIGATION     Social History   Occupational History  . Occupation: Oceanographer  . Occupation: Retired  Tobacco Use  . Smoking status: Never Smoker  . Smokeless tobacco: Never Used  Substance and Sexual Activity  . Alcohol use: Yes    Comment: Rarely; socially  . Drug use: No  . Sexual activity: Not Currently

## 2018-11-12 NOTE — Progress Notes (Signed)
Assessment and Plan:   Hyperlipidemia associated with type 2 diabetes mellitus ([place]) -     Lipid Profile check lipids decrease fatty foods increase activity.   Type 2 diabetes mellitus with stage 2 chronic kidney disease, without long-term current use of insulin (HCC) -     Hemoglobin A1c ([facility]) Discussed general issues about diabetes pathophysiology and management., Educational material distributed., Suggested low cholesterol diet., Encouraged aerobic exercise., Discussed foot care., Reminded to get yearly retinal exam.  CKD stage 2 due to type 2 diabetes mellitus ([place]) -     Hemoglobin A1c ([facility]) Increase fluids, avoid NSAIDS, monitor sugars, will monitor  Essential hypertension - continue medications, DASH diet, exercise and monitor at home. Call if greater than 130/80.  -     CBC with Diff -     COMPLETE METABOLIC PANEL WITH GFR -     TSH  Morbid obesity ([place]) - follow up 3 months for progress monitoring - increase veggies, decrease carbs - long discussion about weight loss, diet, and exercise  Medication management -     Magnesium  Vitamin D deficiency -     Vitamin D (25 hydroxy)  Continue diet and meds as discussed. Further disposition pending results of labs. Discussed med's effects and SE's.   Over 30 minutes of exam, counseling, chart review, and critical decision making was performed   HPI 68 y.o. female  presents for 3 month follow up on hypertension, cholesterol, diabetes and vitamin D deficiency.   Her blood pressure has been controlled at home, today her BP is BP: 130/70.  She does workout, walks sporadically. She denies chest pain, shortness of breath, dizziness.  She is following with Dr. Ninfa Linden for right hip pain, has had 3 injections and is still having the pain, will go back Monday. Marland Kitchen    BMI is Body mass index is 39.63 kg/m., she is working on diet and exercise. Wt Readings from Last 3 Encounters:  11/16/18 196 lb 3.2 oz (89 kg)  08/04/18  196 lb 6.4 oz (89.1 kg)  05/04/18 196 lb 6.4 oz (89.1 kg)    She has been working on diet and exercise for diabetes With CKD stage 1 not on ACE/ARB Hyperlipidemia not at goal  she is not on bASA  and denies  paresthesia of the feet, polydipsia, polyuria and visual disturbances.  Last A1C was:  Lab Results  Component Value Date   HGBA1C 6.3 (H) 08/04/2018   Lab Results  Component Value Date   GFRAA 79 08/04/2018   Lab Results  Component Value Date   CHOL 152 08/04/2018   HDL 46 (L) 08/04/2018   LDLCALC 89 08/04/2018   TRIG 82 08/04/2018   CHOLHDL 3.3 08/04/2018    Patient is on Vitamin D supplement, 2000 IU daily Lab Results  Component Value Date   VD25OH 37 08/04/2018    Current Medications:  Current Outpatient Medications on File Prior to Visit  Medication Sig Dispense Refill  . allopurinol (ZYLOPRIM) 300 MG tablet Take 1 tablet daily to prevent Gout 90 tablet 3  . amLODipine (NORVASC) 10 MG tablet TAKE 1 TABLET BY MOUTH EVERY DAY (TAKE 1/2 TO 1 TABLET DEPENDING ON BLOOD PRESSURE READING) 90 tablet 1  . aspirin 81 MG tablet Take 81 mg by mouth daily.    . busPIRone (BUSPAR) 5 MG tablet TAKE 1 TABLET BY MOUTH THREE TIMES A DAY 270 tablet 1  . Cholecalciferol (VITAMIN D3) 25 MCG (1000 UT) CAPS Take 2 cap daily 60  capsule   . cyclobenzaprine (FLEXERIL) 10 MG tablet TAKE 1 TABLET BY MOUTH EVERYDAY AT BEDTIME 40 tablet 0  . hydrochlorothiazide (HYDRODIURIL) 25 MG tablet TAKE 1 TABLET EACH MORNING FOR BP & FLUID & SWELLING 90 tablet 1  . Magnesium 250 MG TABS Take by mouth daily.     No current facility-administered medications on file prior to visit.    Medical History:  Past Medical History:  Diagnosis Date  . Hypertension   . Obesity   . Pre-diabetes   . Vitamin D deficiency    Allergies:  Allergies  Allergen Reactions  . Ziac [Bisoprolol-Hydrochlorothiazide]     Flushing     Review of Systems:  Review of Systems  Constitutional: Negative.   HENT:  Negative.   Eyes: Negative.   Respiratory: Negative.   Cardiovascular: Negative.   Gastrointestinal: Negative.   Genitourinary: Negative.   Musculoskeletal: Negative.   Skin: Negative.   Neurological: Negative.   Endo/Heme/Allergies: Negative.   Psychiatric/Behavioral: Negative.     Family history- Review and unchanged Social history- Review and unchanged Physical Exam: BP 130/70   Pulse 62   Temp 97.6 F (36.4 C)   Wt 196 lb 3.2 oz (89 kg)   SpO2 99%   BMI 39.63 kg/m  Wt Readings from Last 3 Encounters:  11/16/18 196 lb 3.2 oz (89 kg)  08/04/18 196 lb 6.4 oz (89.1 kg)  05/04/18 196 lb 6.4 oz (89.1 kg)   General Appearance: Well nourished, in no apparent distress. Eyes: PERRLA, EOMs, conjunctiva no swelling or erythema Sinuses: No Frontal/maxillary tenderness ENT/Mouth: Ext aud canals clear, TMs without erythema, bulging. No erythema, swelling, or exudate on post pharynx.  Tonsils not swollen or erythematous. Hearing normal.  Neck: Supple, thyroid normal.  Respiratory: Respiratory effort normal, BS equal bilaterally without rales, rhonchi, wheezing or stridor.  Cardio: RRR with no MRGs. Brisk peripheral pulses without edema.  Abdomen: Soft, + BS.  Non tender, no guarding, rebound, hernias, masses. Lymphatics: Non tender without lymphadenopathy.  Musculoskeletal: Full ROM, 5/5 strength, Normal gait Skin: Warm, dry without rashes, lesions, ecchymosis.  Neuro: Cranial nerves intact. No cerebellar symptoms.  Psych: Awake and oriented X 3, normal affect, Insight and Judgment appropriate.    Brittney Mutters, PA-C 3:59 PM [facility]

## 2018-11-13 ENCOUNTER — Ambulatory Visit: Payer: Self-pay | Admitting: Physician Assistant

## 2018-11-16 ENCOUNTER — Other Ambulatory Visit: Payer: Self-pay

## 2018-11-16 ENCOUNTER — Ambulatory Visit: Payer: Medicare Other | Admitting: Physician Assistant

## 2018-11-16 ENCOUNTER — Encounter: Payer: Self-pay | Admitting: Physician Assistant

## 2018-11-16 VITALS — BP 130/70 | HR 62 | Temp 97.6°F | Wt 196.2 lb

## 2018-11-16 DIAGNOSIS — N182 Chronic kidney disease, stage 2 (mild): Secondary | ICD-10-CM

## 2018-11-16 DIAGNOSIS — Z79899 Other long term (current) drug therapy: Secondary | ICD-10-CM

## 2018-11-16 DIAGNOSIS — E1169 Type 2 diabetes mellitus with other specified complication: Secondary | ICD-10-CM

## 2018-11-16 DIAGNOSIS — E785 Hyperlipidemia, unspecified: Secondary | ICD-10-CM

## 2018-11-16 DIAGNOSIS — E559 Vitamin D deficiency, unspecified: Secondary | ICD-10-CM

## 2018-11-16 DIAGNOSIS — E1122 Type 2 diabetes mellitus with diabetic chronic kidney disease: Secondary | ICD-10-CM

## 2018-11-16 DIAGNOSIS — I10 Essential (primary) hypertension: Secondary | ICD-10-CM | POA: Diagnosis not present

## 2018-11-16 NOTE — Patient Instructions (Signed)
General eating tips  What to Avoid . Avoid added sugars o Often added sugar can be found in processed foods such as many condiments, dry cereals, cakes, cookies, chips, crisps, crackers, candies, sweetened drinks, etc.  o Read labels and AVOID/DECREASE use of foods with the following in their ingredient list: Sugar, fructose, high fructose corn syrup, sucrose, glucose, maltose, dextrose, molasses, cane sugar, brown sugar, any type of syrup, agave nectar, etc.   . Avoid snacking in between meals- drink water or if you feel you need a snack, pick a high water content snack such as cucumbers, watermelon, or any veggie.  . Avoid foods made with flour o If you are going to eat food made with flour, choose those made with whole-grains; and, minimize your consumption as much as is tolerable . Avoid processed foods o These foods are generally stocked in the middle of the grocery store.  o Focus on shopping on the perimeter of the grocery.  What to Include . Vegetables o GREEN LEAFY VEGETABLES: Kale, spinach, mustard greens, collard greens, cabbage, broccoli, etc. o OTHER: Asparagus, cauliflower, eggplant, carrots, peas, Brussel sprouts, tomatoes, bell peppers, zucchini, beets, cucumbers, etc. . Grains, seeds, and legumes o Beans: kidney beans, black eyed peas, garbanzo beans, black beans, pinto beans, etc. o Whole, unrefined grains: brown rice, barley, bulgur, oatmeal, etc. . Healthy fats  o Avoid highly processed fats such as vegetable oil o Examples of healthy fats: avocado, olives, virgin olive oil, dark chocolate (?72% Cocoa), nuts (peanuts, almonds, walnuts, cashews, pecans, etc.) o Please still do small amount of these healthy fats, they are dense in calories.  . Low - Moderate Intake of Animal Sources of Protein o Meat sources: chicken, turkey, salmon, tuna. Limit to 4 ounces of meat at one time or the size of your palm. o Consider limiting dairy sources, but when choosing dairy focus on:  PLAIN Greek yogurt, cottage cheese, high-protein milk . Fruit o Choose berries    If I told you I had a single pill that would help you with everything listed below and more, would you be interested? . decrease stress by improving anxiety and depression . help you achieve a healthy weight . give you more energy . make you more productive . help you focus . decrease your risk of dementia/heart attack/stroke/falls . improve your bone health  These are just some of the benefits that exercise brings to you.   IT IS WORTH carving out some time every day to fit in exercise. It will help in every aspect of your health. Even if you have injuries that prevent you from participating in a type of exercise you used to do; there is always something that you can do to keep exercise a part of your life. If improving your health is important, make exercise your priority. It is worth the time! If you have questions about the type of exercise that is right for you, please talk with me about this!  EXERCISE IS MEDICINE!  Benefits of Exercise  Reduces breast cancer onset and recurrence by 50% Lowers risk of colon cancer by 66% Reduces the risk of Alzheimer's by almost 50% Reduces heart disease and high blood pressure by almost 50% Lowers risk of stroke by 33% Lowers risk of Type II Diabetes Mellitus by over 60% Treats depression as well as medication or cognitive behavioral therapy       Exercising to Stay Healthy  Exercising regularly is important. It has many health benefits, such as:    Improving your overall fitness, flexibility, and endurance.  Increasing your bone density.  Helping with weight control.  Decreasing your body fat.  Increasing your muscle strength.  Reducing stress and tension.  Improving your overall health.   In order to become healthy and stay healthy, it is recommended that you do moderate-intensity and vigorous-intensity exercise. You can tell that you are  exercising at a moderate intensity if you have a higher heart rate and faster breathing, but you are still able to hold a conversation. You can tell that you are exercising at a vigorous intensity if you are breathing much harder and faster and cannot hold a conversation while exercising. How often should I exercise? Choose an activity that you enjoy and set realistic goals. Your health care provider can help you to make an activity plan that works for you. Exercise regularly as directed by your health care provider. This may include:  Doing resistance training twice each week, such as: ? Push-ups. ? Sit-ups. ? Lifting weights. ? Using resistance bands.  Doing a given intensity of exercise for a given amount of time. Choose from these options: ? 150 minutes of moderate-intensity exercise every week. ? 75 minutes of vigorous-intensity exercise every week. ? A mix of moderate-intensity and vigorous-intensity exercise every week.   Children, pregnant women, people who are out of shape, people who are overweight, and older adults may need to consult a health care provider for individual recommendations. If you have any sort of medical condition, be sure to consult your health care provider before starting a new exercise program. What are some exercise ideas? Some moderate-intensity exercise ideas include:  Walking at a rate of 1 mile in 15 minutes.  Biking.  Hiking.  Golfing.  Dancing.   Some vigorous-intensity exercise ideas include:  Walking at a rate of at least 4.5 miles per hour.  Jogging or running at a rate of 5 miles per hour.  Biking at a rate of at least 10 miles per hour.  Lap swimming.  Roller-skating or in-line skating.  Cross-country skiing.  Vigorous competitive sports, such as football, basketball, and soccer.  Jumping rope.  Aerobic dancing.   What are some everyday activities that can help me to get exercise?  Yard work, such as: ? Pushing a lawn  mower. ? Raking and bagging leaves.  Washing and waxing your car.  Pushing a stroller.  Shoveling snow.  Gardening.  Washing windows or floors. How can I be more active in my day-to-day activities?  Use the stairs instead of the elevator.  Take a walk during your lunch break.  If you drive, park your car farther away from work or school.  If you take public transportation, get off one stop early and walk the rest of the way.  Make all of your phone calls while standing up and walking around.  Get up, stretch, and walk around every 30 minutes throughout the day. What guidelines should I follow while exercising?  Do not exercise so much that you hurt yourself, feel dizzy, or get very short of breath.  Consult your health care provider before starting a new exercise program.  Wear comfortable clothes and shoes with good support.  Drink plenty of water while you exercise to prevent dehydration or heat stroke. Body water is lost during exercise and must be replaced.  Work out until you breathe faster and your heart beats faster. This information is not intended to replace advice given to you by your health care   provider. Make sure you discuss any questions you have with your health care provider.

## 2018-11-17 LAB — COMPLETE METABOLIC PANEL WITH GFR
AG Ratio: 1.6 (calc) (ref 1.0–2.5)
ALT: 3 U/L — ABNORMAL LOW (ref 6–29)
AST: 14 U/L (ref 10–35)
Albumin: 4.3 g/dL (ref 3.6–5.1)
Alkaline phosphatase (APISO): 55 U/L (ref 37–153)
BUN: 17 mg/dL (ref 7–25)
CO2: 33 mmol/L — ABNORMAL HIGH (ref 20–32)
Calcium: 10.1 mg/dL (ref 8.6–10.4)
Chloride: 100 mmol/L (ref 98–110)
Creat: 0.93 mg/dL (ref 0.50–0.99)
GFR, Est African American: 73 mL/min/{1.73_m2} (ref >=60)
GFR, Est Non African American: 63 mL/min/{1.73_m2} (ref >=60)
Globulin: 2.7 g/dL (calc) (ref 1.9–3.7)
Glucose, Bld: 95 mg/dL (ref 65–99)
Potassium: 3.5 mmol/L (ref 3.5–5.3)
Sodium: 140 mmol/L (ref 135–146)
Total Bilirubin: 0.5 mg/dL (ref 0.2–1.2)
Total Protein: 7 g/dL (ref 6.1–8.1)

## 2018-11-17 LAB — CBC WITH DIFFERENTIAL/PLATELET
Absolute Monocytes: 737 cells/uL (ref 200–950)
Basophils Absolute: 53 cells/uL (ref 0–200)
Basophils Relative: 0.7 %
Eosinophils Absolute: 167 cells/uL (ref 15–500)
Eosinophils Relative: 2.2 %
HCT: 43.5 % (ref 35.0–45.0)
Hemoglobin: 14.6 g/dL (ref 11.7–15.5)
Lymphs Abs: 2652 cells/uL (ref 850–3900)
MCH: 28.7 pg (ref 27.0–33.0)
MCHC: 33.6 g/dL (ref 32.0–36.0)
MCV: 85.5 fL (ref 80.0–100.0)
MPV: 11 fL (ref 7.5–12.5)
Monocytes Relative: 9.7 %
Neutro Abs: 3990 cells/uL (ref 1500–7800)
Neutrophils Relative %: 52.5 %
Platelets: 267 10*3/uL (ref 140–400)
RBC: 5.09 10*6/uL (ref 3.80–5.10)
RDW: 13 % (ref 11.0–15.0)
Total Lymphocyte: 34.9 %
WBC: 7.6 10*3/uL (ref 3.8–10.8)

## 2018-11-17 LAB — VITAMIN D 25 HYDROXY (VIT D DEFICIENCY, FRACTURES): Vit D, 25-Hydroxy: 35 ng/mL (ref 30–100)

## 2018-11-17 LAB — TSH: TSH: 1.1 mIU/L (ref 0.40–4.50)

## 2018-11-17 LAB — HEMOGLOBIN A1C
Hgb A1c MFr Bld: 6.3 % of total Hgb — ABNORMAL HIGH (ref <5.7)
Mean Plasma Glucose: 134 (calc)
eAG (mmol/L): 7.4 (calc)

## 2018-11-17 LAB — LIPID PANEL
Cholesterol: 155 mg/dL (ref <200)
HDL: 49 mg/dL — ABNORMAL LOW (ref >=50)
LDL Cholesterol (Calc): 92 mg/dL (calc)
Non-HDL Cholesterol (Calc): 106 mg/dL (calc) (ref <130)
Total CHOL/HDL Ratio: 3.2 (calc) (ref <5.0)
Triglycerides: 62 mg/dL (ref <150)

## 2018-11-17 LAB — MAGNESIUM: Magnesium: 2 mg/dL (ref 1.5–2.5)

## 2018-11-23 ENCOUNTER — Ambulatory Visit: Payer: Self-pay

## 2018-11-23 ENCOUNTER — Ambulatory Visit (INDEPENDENT_AMBULATORY_CARE_PROVIDER_SITE_OTHER): Payer: Medicare Other | Admitting: Physical Medicine and Rehabilitation

## 2018-11-23 ENCOUNTER — Other Ambulatory Visit: Payer: Self-pay

## 2018-11-23 ENCOUNTER — Encounter: Payer: Self-pay | Admitting: Physical Medicine and Rehabilitation

## 2018-11-23 DIAGNOSIS — M461 Sacroiliitis, not elsewhere classified: Secondary | ICD-10-CM

## 2018-11-23 NOTE — Progress Notes (Signed)
.  Numeric Pain Rating Scale and Functional Assessment Average Pain 8   In the last MONTH (on 0-10 scale) has pain interfered with the following?  1. General activity like being  able to carry out your everyday physical activities such as walking, climbing stairs, carrying groceries, or moving a chair?  Rating(9)    -Dye Allergies.  

## 2018-11-23 NOTE — Progress Notes (Signed)
   Brittney Gray - 68 y.o. female MRN [id number]  Date of birth: 1950-05-13  Office Visit Note: Visit Date: 11/23/2018 PCP: Unk Pinto, MD Referred by: Unk Pinto, MD  Subjective: Chief Complaint  Patient presents with  . Right Leg - Pain   HPI:  Brittney Gray is a 68 y.o. female who comes in today At the request of Dr. Jean Rosenthal for right sacroiliac joint injection with fluoroscopic guidance.  Patient is having right hip and buttock pain.  She has had epidural injection in the past.  She will follow-up with Dr. Ninfa Linden.  ROS Otherwise per HPI.  Assessment & Plan: Visit Diagnoses:  1. Sacroiliitis ([place])     Plan: No additional findings.   Meds & Orders: No orders of the defined types were placed in this encounter.   Orders Placed This Encounter  Procedures  . Sacroiliac Joint Inj  . C-ARM NO Order    Follow-up: Return if symptoms worsen or fail to improve, for Jean Rosenthal, M.D..   Procedures: Sacroiliac Joint Inj (Right) on 12/14/2018 3:34 PM Indications: pain and diagnostic evaluation Details: 22 G 3.5 in needle, fluoroscopy-guided posterior approach Medications: 2 mL bupivacaine 0.5 %; 80 mg methylPREDNISolone acetate 80 MG/ML Outcome: tolerated well, no immediate complications  There was excellent flow of contrast producing a partial arthrogram of the sacroiliac joint.  Procedure, treatment alternatives, risks and benefits explained, specific risks discussed. Consent was given by the patient. Immediately prior to procedure a time out was called to verify the correct patient, procedure, equipment, support staff and site/side marked as required. Patient was prepped and draped in the usual sterile fashion.      No notes on file   Clinical History: MRI LUMBAR SPINE WITHOUT CONTRAST  TECHNIQUE: Multiplanar, multisequence MR imaging of the lumbar spine was performed. No intravenous contrast was administered.  COMPARISON: 06/22/2018  radiography  FINDINGS: Segmentation: 5 lumbar type vertebrae  Alignment: Borderline anterolisthesis at L4-5.  Vertebrae: No fracture, evidence of discitis, or bone lesion.  Conus medullaris and cauda equina: Conus extends to the T12-L1 level. Conus and cauda equina appear normal.  Paraspinal and other soft tissues: Negative  Disc levels:  T12- L1: Unremarkable.  L1-L2: Unremarkable.  L2-L3: Minor disc bulging  L3-L4: Mild disc narrowing and desiccation with bulge. No impingement. Mild spinal stenosis  L4-L5: Disc narrowing and bulging with a right paracentral protrusion impinging on the descending L5 nerve root. Spinal stenosis is mild to moderate  L5-S1:Disc narrowing with primarily far-lateral spurring and bulging.  IMPRESSION: 1. Symptomatic finding is likely L4-5 right paracentral protrusion which impinges on the descending L5 nerve root. 2. Noncompressive disc degeneration at L3-4 and L4-5.   Electronically Signed By: Monte Fantasia M.D. On: 07/04/2018 08:53     Objective:  VS:  HT:    WT:   BMI:     BP:   HR: bpm  TEMP: ( )  RESP:  Physical Exam Musculoskeletal:     Comments: Positive Fortin finger test on the right equivocally positive Patrick's.     Ortho Exam Imaging: No results found.

## 2018-12-14 ENCOUNTER — Telehealth: Payer: Self-pay | Admitting: Radiology

## 2018-12-14 DIAGNOSIS — M461 Sacroiliitis, not elsewhere classified: Secondary | ICD-10-CM | POA: Diagnosis not present

## 2018-12-14 MED ORDER — BUPIVACAINE HCL 0.5 % IJ SOLN
2.0000 mL | INTRAMUSCULAR | Status: AC | PRN
  Administered 2018-12-14: 2 mL via INTRA_ARTICULAR

## 2018-12-14 MED ORDER — METHYLPREDNISOLONE ACETATE 80 MG/ML IJ SUSP
80.0000 mg | INTRAMUSCULAR | Status: AC | PRN
  Administered 2018-12-14: 80 mg via INTRA_ARTICULAR

## 2018-12-14 NOTE — Telephone Encounter (Signed)
Patient Brittney Gray states that she was in for an injection andshe would like to know what type of injection that was done and what medicines were used.  She states that her hair is falling out really bad and says it is a reaction to the procedure.  Please advise.

## 2018-12-14 NOTE — Telephone Encounter (Signed)
The medicine we used was Depo-Medrol and that was a sacroiliac joint injection.  She had the same injection medicine in the past with epidural injections on 2 occasions.  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  She should talk to her primary care physician or perhaps a dermatologist.

## 2018-12-15 NOTE — Telephone Encounter (Signed)
I called to advised patient of your response, she states that she doesn't have the money to go see these Drs. And that she did not have this problem with the other 2 injections. She said something isn't right. She wants to know if tere is something that you can advise her to try whether it is some hair cream, oils or gels for her to try.  She states that she does not want any pills. Please advise

## 2018-12-16 NOTE — Telephone Encounter (Signed)
I called and advised patient of message below--she states ok then

## 2018-12-16 NOTE — Telephone Encounter (Signed)
She should talk to her PCP. The medication was exactly the same. I have no experience with hair loss whatsoever. I am truly sorry I have no recommendations. If it is the condition I named before it is considered temporary.

## 2018-12-21 NOTE — Progress Notes (Signed)
Assessment and Plan:  Brittney Gray was seen today for alopecia.  Diagnoses and all orders for this visit:  Hair loss Hair loss pattern on exam suggestive of female pattern hair loss ? Acute on chronic etiologies; sudden increase in last 2 weeks  No family history Will r/o thyroid etiology, check deficiencies of zinc, iron/ferritin She will initiate on hair/skin/nails supplement Check testosterone for female patter hair loss However patient is very anxious, anticipate proceeding with dermatology referral for further specialist input pending lab results; initiate proprecia if in agreement  -     CBC with Diff -     Zinc -     TSH -     Iron, TIBC and Ferritin Panel -     Testosterone, Total  Further disposition pending results of labs. Discussed med's effects and SE's.   Over 15 minutes of exam, counseling, chart review, and critical decision making was performed.   Future Appointments  Date Time Provider [place]  02/23/2019  9:00 AM [name], PA-C [id number] None    ------------------------------------------------------------------------------------------------------------------   HPI BP 130/90   Pulse 86   Temp (!) 97.3 F (36.3 C)   Wt 192 lb (87.1 kg)   SpO2 98%   BMI 38.78 kg/m   68 y.o.female with htn, hyperlipdemia, T2DM, morbid obesity, situational anxiety presents for evaluation of 2 weeks of atypical hair loss;    Reports always had thinning on top and at crown x 4-5 years but reports had been mild/stable, but nothing like it is currently.   She reports hair has been doing well this year, had a texturizer put in in October of this year, seemed like hair was growing out and doing well but suddenly in the last 2 weeks has noted increased hair loss, has noted chunks of hair coming out in the morning when combing. She denies other areas of notable hair loss.   She thought may be r/t recent shot by ortho but called - this is 3rd shot, just a steroid, they  don't believe this is related. Otherwise no new medications, supplements, products at home.   Denies itching, rash, discharge, greasiness of hair; denies fatigue/malaise, dizziness, cold intolerance, dyspnea, weight gain, atypical stress. She denies taking any supplements containing selenium. Denies history of menorrhagia, irregular menses, intertility, denies hirsutism. Denies family history of alopecia.   She is not on thyroid medication.  Lab Results  Component Value Date   TSH 1.10 11/16/2018   Lab Results  Component Value Date   IRON 62 09/21/2014   TIBC 315 09/21/2014   FERRITIN 63 09/21/2014   Lab Results  Component Value Date   WBC 7.6 11/16/2018   HGB 14.6 11/16/2018   HCT 43.5 11/16/2018   MCV 85.5 11/16/2018   PLT 267 11/16/2018      Past Medical History:  Diagnosis Date  . Hypertension   . Obesity   . Pre-diabetes   . Vitamin D deficiency      Allergies  Allergen Reactions  . Ziac [Bisoprolol-Hydrochlorothiazide]     Flushing     Current Outpatient Medications on File Prior to Visit  Medication Sig  . allopurinol (ZYLOPRIM) 300 MG tablet Take 1 tablet daily to prevent Gout  . amLODipine (NORVASC) 10 MG tablet TAKE 1 TABLET BY MOUTH EVERY DAY (TAKE 1/2 TO 1 TABLET DEPENDING ON BLOOD PRESSURE READING)  . aspirin 81 MG tablet Take 81 mg by mouth daily.  . busPIRone (BUSPAR) 5 MG tablet TAKE 1 TABLET BY MOUTH THREE  TIMES A DAY  . Cholecalciferol (VITAMIN D3) 25 MCG (1000 UT) CAPS Take 2 cap daily  . cyclobenzaprine (FLEXERIL) 10 MG tablet TAKE 1 TABLET BY MOUTH EVERYDAY AT BEDTIME  . hydrochlorothiazide (HYDRODIURIL) 25 MG tablet TAKE 1 TABLET EACH MORNING FOR BP & FLUID & SWELLING  . Magnesium 250 MG TABS Take by mouth daily.   No current facility-administered medications on file prior to visit.    ROS: all negative except above.   Physical Exam:  BP 130/90   Pulse 86   Temp (!) 97.3 F (36.3 C)   Wt 192 lb (87.1 kg)   SpO2 98%   BMI 38.78  kg/m   General Appearance: Well nourished, obese AA female, in no apparent distress. Eyes: PERRLA, conjunctiva no swelling or erythema ENT/Mouth: Hearing normal.  Neck: Supple, thyroid normal.  Respiratory: Respiratory effort normal, BS equal bilaterally without rales, rhonchi, wheezing or stridor.  Cardio: RRR with no MRGs. Brisk peripheral pulses without edema.  Abdomen: Soft, + BS.  Non tender. Lymphatics: Non tender without lymphadenopathy.  Musculoskeletal: Full ROM, 5/5 strength, normal gait.  Skin: Warm, dry without rashes, lesions, ecchymosis; scalp with short natural/textured hair, with thinning and easily visible scalp at frontal and crown areas; occipital regions area spared; several strands easily removed with minimal pressure with no erythema, rash, discharge notable to scalp Neuro: Normal muscle tone, Sensation intact.  Psych: Awake and oriented X 3, anxious affect, Insight and Judgment appropriate.     Izora Ribas, NP 5:31 PM [facility]

## 2018-12-22 ENCOUNTER — Other Ambulatory Visit: Payer: Self-pay

## 2018-12-22 ENCOUNTER — Encounter: Payer: Self-pay | Admitting: Adult Health

## 2018-12-22 ENCOUNTER — Ambulatory Visit (INDEPENDENT_AMBULATORY_CARE_PROVIDER_SITE_OTHER): Payer: Medicare Other | Admitting: Adult Health

## 2018-12-22 VITALS — BP 130/90 | HR 86 | Temp 97.3°F | Wt 192.0 lb

## 2018-12-22 DIAGNOSIS — L659 Nonscarring hair loss, unspecified: Secondary | ICD-10-CM | POA: Insufficient documentation

## 2018-12-22 NOTE — Patient Instructions (Addendum)
Make sure you are not on any selenium supplements  Checking for deficiencies and androgenic andropecia

## 2018-12-27 LAB — TESTOSTERONE, FREE: TESTOSTERONE FREE: 1.5 pg/mL (ref 0.2–5.0)

## 2018-12-27 LAB — TSH: TSH: 1.68 mIU/L (ref 0.40–4.50)

## 2018-12-27 LAB — CBC WITH DIFFERENTIAL/PLATELET
Absolute Monocytes: 570 cells/uL (ref 200–950)
Basophils Absolute: 51 cells/uL (ref 0–200)
Basophils Relative: 0.8 %
Eosinophils Absolute: 179 cells/uL (ref 15–500)
Eosinophils Relative: 2.8 %
HCT: 45 % (ref 35.0–45.0)
Hemoglobin: 15.3 g/dL (ref 11.7–15.5)
Lymphs Abs: 2470 cells/uL (ref 850–3900)
MCH: 29.5 pg (ref 27.0–33.0)
MCHC: 34 g/dL (ref 32.0–36.0)
MCV: 86.7 fL (ref 80.0–100.0)
MPV: 10.7 fL (ref 7.5–12.5)
Monocytes Relative: 8.9 %
Neutro Abs: 3130 cells/uL (ref 1500–7800)
Neutrophils Relative %: 48.9 %
Platelets: 265 10*3/uL (ref 140–400)
RBC: 5.19 10*6/uL — ABNORMAL HIGH (ref 3.80–5.10)
RDW: 12.9 % (ref 11.0–15.0)
Total Lymphocyte: 38.6 %
WBC: 6.4 10*3/uL (ref 3.8–10.8)

## 2018-12-27 LAB — IRON,TIBC AND FERRITIN PANEL
%SAT: 26 % (calc) (ref 16–45)
Ferritin: 38 ng/mL (ref 16–288)
Iron: 100 ug/dL (ref 45–160)
TIBC: 380 mcg/dL (calc) (ref 250–450)

## 2018-12-28 ENCOUNTER — Other Ambulatory Visit: Payer: Self-pay | Admitting: Adult Health

## 2018-12-28 DIAGNOSIS — L659 Nonscarring hair loss, unspecified: Secondary | ICD-10-CM

## 2019-01-06 ENCOUNTER — Encounter: Payer: Self-pay | Admitting: Adult Health

## 2019-01-31 ENCOUNTER — Ambulatory Visit: Payer: Medicare PPO | Attending: Internal Medicine

## 2019-01-31 DIAGNOSIS — Z23 Encounter for immunization: Secondary | ICD-10-CM

## 2019-01-31 NOTE — Progress Notes (Signed)
   Covid-19 Vaccination Clinic  Name:  Adamari Goulette    MRN: [id number] DOB: [birth year]  01/31/2019  Ms. Benney was observed post Covid-19 immunization for 15 minutes without incidence. She was provided with Vaccine Information Sheet and instruction to access the V-Safe system.   Ms. Sina was instructed to call 911 with any severe reactions post vaccine: Marland Kitchen Difficulty breathing  . Swelling of your face and throat  . A fast heartbeat  . A bad rash all over your body  . Dizziness and weakness    Immunizations Administered    Name Date Dose VIS Date Route   Pfizer COVID-19 Vaccine 01/31/2019 10:49 AM 0.3 mL 12/18/2018 Intramuscular   Manufacturer: [place]   Lot: [id number]   [place]: [id number]

## 2019-02-11 ENCOUNTER — Other Ambulatory Visit: Payer: Self-pay | Admitting: Physician Assistant

## 2019-02-11 DIAGNOSIS — R609 Edema, unspecified: Secondary | ICD-10-CM

## 2019-02-11 DIAGNOSIS — I10 Essential (primary) hypertension: Secondary | ICD-10-CM

## 2019-02-11 DIAGNOSIS — L659 Nonscarring hair loss, unspecified: Secondary | ICD-10-CM | POA: Diagnosis not present

## 2019-02-23 ENCOUNTER — Encounter: Payer: Medicare Other | Admitting: Physician Assistant

## 2019-02-27 ENCOUNTER — Ambulatory Visit: Payer: Medicare PPO | Attending: Internal Medicine

## 2019-02-27 DIAGNOSIS — Z23 Encounter for immunization: Secondary | ICD-10-CM

## 2019-02-27 NOTE — Progress Notes (Signed)
   Covid-19 Vaccination Clinic  Name:  Brittney Gray    MRN: [id number] DOB: [birth year]  02/27/2019  [name] was observed post Covid-19 immunization for 15 minutes without incidence. She was provided with Vaccine Information Sheet and instruction to access the V-Safe system.   [name] was instructed to call 911 with any severe reactions post vaccine: Marland Kitchen Difficulty breathing  . Swelling of your face and throat  . A fast heartbeat  . A bad rash all over your body  . Dizziness and weakness    Immunizations Administered    Name Date Dose VIS Date Route   Pfizer COVID-19 Vaccine 02/27/2019 10:17 AM 0.3 mL 12/18/2018 Intramuscular   Manufacturer: [employer]   Lot: [id number]   [place]: [id number]

## 2019-03-01 ENCOUNTER — Other Ambulatory Visit: Payer: Self-pay

## 2019-03-01 ENCOUNTER — Ambulatory Visit (INDEPENDENT_AMBULATORY_CARE_PROVIDER_SITE_OTHER): Payer: Medicare PPO | Admitting: Orthopaedic Surgery

## 2019-03-01 DIAGNOSIS — M4807 Spinal stenosis, lumbosacral region: Secondary | ICD-10-CM

## 2019-03-01 DIAGNOSIS — M5431 Sciatica, right side: Secondary | ICD-10-CM

## 2019-03-01 DIAGNOSIS — G8929 Other chronic pain: Secondary | ICD-10-CM

## 2019-03-01 NOTE — Progress Notes (Signed)
The patient is someone of seen multiple times for right-sided low back pain and sciatic pain as well as hip pain.  She denies any groin pain.  She has had at least 2 right-sided L5-S1 injections by Dr. Ernestina Patches and one right-sided SI joint injection by Dr. Ernestina Patches.  The 2 spine injections were in August of this past year and then the SI joint injection was in November of this past year.  She continues to complain of right-sided low back pain that radiates sometimes down to her calf.  She denies any groin pain.  On examination of her right hip it moves fluidly and smoothly with no pain in the groin.  Previous films of her pelvis and right hip were normal.  Her pain still seems to be to the right side at the low back and sciatic region.  It has been 6 months since her last steroid injection in her lumbar spine which was helpful before.  I would like to send her back to Dr. Ernestina Patches to consider a right-sided L5-S1 injection again.  He may need to provide 1 at some point in the facet joint as well at L5-S1.  We will set her up first for an epidural steroid injection.  Once he sees her he can get her back to [place] about 2 to 3 weeks after his intervention.  All question concerns were answered and addressed.  She would like to consider this treatment route as well.

## 2019-03-02 NOTE — Progress Notes (Deleted)
CPE  NEED 3 MONTH WELLNESS  Assessment and Plan: CPE 1 year Make MGM appointment  Type 2 diabetes mellitus with stage 2 chronic kidney disease, without long-term current use of insulin (HCC) -     Hemoglobin A1c Discussed general issues about diabetes pathophysiology and management., Educational material distributed., Suggested low cholesterol diet., Encouraged aerobic exercise., Discussed foot care., Reminded to get yearly retinal exam.  CKD stage 2 due to type 2 diabetes mellitus ([place]) -     Hemoglobin A1c Discussed general issues about diabetes pathophysiology and management., Educational material distributed., Suggested low cholesterol diet., Encouraged aerobic exercise., Discussed foot care., Reminded to get yearly retinal exam.  Morbid obesity ([place]) - follow up 3 months for progress monitoring - increase veggies, decrease carbs - long discussion about weight loss, diet, and exercise  Hyperlipidemia associated with type 2 diabetes mellitus ([place]) -     Lipid panel  Mixed hyperlipidemia check lipids decrease fatty foods increase activity.   Class 2 severe obesity due to excess calories with serious comorbidity and body mass index (BMI) of 38.0 to 38.9 in adult [facility]) - follow up 3 months for progress monitoring - increase veggies, decrease carbs - long discussion about weight loss, diet, and exercise  Essential hypertension -     CBC with Differential/Platelet -     COMPLETE METABOLIC PANEL WITH GFR -     TSH - continue medications, DASH diet, exercise and monitor at home. Call if greater than 130/80.   Acute gout involving toe, unspecified cause, unspecified laterality -     Uric acid Stay on allopurinol daily  Vitamin D deficiency -     VITAMIN D 25 Hydroxy (Vit-D Deficiency, Fractures)  Medication management -     Magnesium  Iron deficiency anemia, unspecified iron deficiency anemia type monitor  Discussed med's effects and SE's. Screening labs and tests as  requested with regular follow-up as recommended. Over 30 minutes of exam, counseling, chart review, and complex, high level critical decision making was performed this visit.   Future Appointments  Date Time Provider [place]  03/03/2019  4:00 PM [name], [place] [facility] None  02/24/2020  9:00 AM [name], PA-C [facility] None  03/06/2020  3:00 PM [name], PA-C [facility] None     HPI  69 y.o. AA female, presents for a medicare and follow up. She has Essential hypertension; Hyperlipidemia associated with type 2 diabetes mellitus ([place]); Vitamin D deficiency; Type 2 diabetes with stage 2 chronic kidney disease GFR 60-89 ([place]); Morbid obesity ([place]); Medication management; CKD stage 2 due to type 2 diabetes mellitus ([place]); Situational anxiety; Lumbar radiculopathy; and Alopecia of scalp on their problem list.   June 30th she got tested and July 13th she got negative results, then July 14th she was around her niece and she got tested July 14th and she was positive. She then got retested on July 15th that was negative, she has been in her house since that time. She states she had horrible anxiety and had buspar that helped some, said it made her sleepy but helped relax.   She has persistent but intermittent R hip pain, follows Dr. Ninfa Linden, had MRI back with L4-L5 disc bulge on the right to L5 and going to get a shot Monday.    divorced/widowed, 53 son, 49 year old granddaughter, retired but still occasionally substitute teaches, very active in church -   BMI is There is no height or weight on file to calculate BMI., she has not  been working on diet and admits to very poor eating habits, frequently doesn't eat until dinner, might eat crackers during the day.  Wt Readings from Last 3 Encounters:  12/22/18 192 lb (87.1 kg)  11/16/18 196 lb 3.2 oz (89 kg)  08/04/18 196 lb 6.4 oz (89.1 kg)   Her blood pressure has been controlled at home, today their BP is   She does not  workout. She denies chest pain, shortness of breath, dizziness.   She is not on cholesterol medication, currently managed by lifestyle changes. Her cholesterol is at goal. The cholesterol last visit was:   Lab Results  Component Value Date   CHOL 155 11/16/2018   HDL 49 (L) 11/16/2018   LDLCALC 92 11/16/2018   TRIG 62 11/16/2018   CHOLHDL 3.2 11/16/2018   She has been working on diet and exercise for T2DM currently managed by lifestyle modification, she is on bASA, she is not on ACE/ARB and denies increased appetite, nausea, paresthesia of the feet, polydipsia, polyuria, visual disturbances and vomiting. Last A1C in the office was:  Lab Results  Component Value Date   HGBA1C 6.3 (H) 11/16/2018   Last GFR: Lab Results  Component Value Date   GFRNONAA 63 11/16/2018   Patient is on Vitamin D supplement but remained below goal at the last check:    Lab Results  Component Value Date   VD25OH 35 11/16/2018      Current Medications:  Current Outpatient Medications on File Prior to Visit  Medication Sig Dispense Refill  . allopurinol (ZYLOPRIM) 300 MG tablet Take 1 tablet daily to prevent Gout 90 tablet 3  . amLODipine (NORVASC) 10 MG tablet TAKE 1 TABLET BY MOUTH EVERY DAY (TAKE 1/2 TO 1 TABLET DEPENDING ON BLOOD PRESSURE READING) 90 tablet 1  . aspirin 81 MG tablet Take 81 mg by mouth daily.    . busPIRone (BUSPAR) 5 MG tablet TAKE 1 TABLET BY MOUTH THREE TIMES A DAY 270 tablet 1  . Cholecalciferol (VITAMIN D3) 25 MCG (1000 UT) CAPS Take 2 cap daily 60 capsule   . cyclobenzaprine (FLEXERIL) 10 MG tablet TAKE 1 TABLET BY MOUTH EVERYDAY AT BEDTIME 40 tablet 0  . hydrochlorothiazide (HYDRODIURIL) 25 MG tablet Take 1 tablet Daily  for BP & Fluid Retention / Ankle Swelling 90 tablet 3  . Magnesium 250 MG TABS Take by mouth daily.     No current facility-administered medications on file prior to visit.   Allergies:  Allergies  Allergen Reactions  . Ziac  [Bisoprolol-Hydrochlorothiazide]     Flushing    Medical History:  She has Essential hypertension; Hyperlipidemia associated with type 2 diabetes mellitus ([place]); Vitamin D deficiency; Type 2 diabetes with stage 2 chronic kidney disease GFR 60-89 ([place]); Morbid obesity ([place]); Medication management; CKD stage 2 due to type 2 diabetes mellitus ([place]); Situational anxiety; Lumbar radiculopathy; and Alopecia of scalp on their problem list. Health Maintenance:   Immunization History  Administered Date(s) Administered  . Influenza, High Dose Seasonal PF 05/04/2018  . PFIZER SARS-COV-2 Vaccination 01/31/2019, 02/27/2019  . PPD Test 04/26/2013   Preventative care: Last colonoscopy: 09/2014 EGD: 09/2014 Dr. Collene Mares Last mammogram: 01/2017, will schedule Last pap smear/pelvic exam: 2016 declines another DEXA: 2013  Prior vaccinations: TD or Tdap: 2009, declines at this        Influenza: declines Pneumococcal: declines Prevnar13: declines Shingles/Zostavax: declines   Names of Other Physician/Practitioners you currently use: 1. [facility] here for primary care 2.  Dr. Truman Hayward, eye doctor, last visit 02/2017 wears reading glasses- need follow up   3. Dr. Sallee Lange, dentist, last visit 2019, q74m  Patient Care Team: Unk Pinto, MD as PCP - General (Internal Medicine) Juanita Craver, MD as Consulting Physician (Gastroenterology)  Surgical History:  She has a past surgical history that includes Cesarean section (1971); Knee arthroscopy (Right); Carpal tunnel release (Right); and Tubal ligation. Family History:  Herfamily history includes Aneurysm in her sister; Diabetes in her brother and brother; Heart disease in her brother and brother; Hypertension in her sister; Stroke in her mother; Thyroid disease in her sister. Social History:  She reports that she has never smoked. She has never used smokeless tobacco. She reports current alcohol use. She reports  that she does not use drugs.    Review of Systems: Review of Systems  Constitutional: Negative for malaise/fatigue and weight loss.  HENT: Negative for hearing loss and tinnitus.   Eyes: Negative for blurred vision and double vision.  Respiratory: Negative for cough, shortness of breath and wheezing.   Cardiovascular: Negative for chest pain, palpitations, orthopnea, claudication and leg swelling.  Gastrointestinal: Negative for abdominal pain, blood in stool, constipation, diarrhea, heartburn, melena, nausea and vomiting.  Genitourinary: Negative.   Musculoskeletal: Positive for joint pain (R hip pain x 2-3 months). Negative for myalgias.  Skin: Negative for rash.  Neurological: Negative for dizziness, tingling, sensory change, weakness and headaches.  Endo/Heme/Allergies: Negative for polydipsia.  Psychiatric/Behavioral: Negative.  Negative for depression. The patient is not nervous/anxious and does not have insomnia.   All other systems reviewed and are negative.   Physical Exam: Estimated body mass index is 38.78 kg/m as calculated from the following:   Height as of 08/04/18: 4\' 11"  (1.499 m).   Weight as of 12/22/18: 192 lb (87.1 kg). There were no vitals taken for this visit. General Appearance: Well nourished, in no apparent distress.  Eyes: PERRLA, EOMs, conjunctiva no swelling or erythema, normal fundi and vessels.  Sinuses: No Frontal/maxillary tenderness  ENT/Mouth: Ext aud canals clear, normal light reflex with TMs without erythema, bulging. Good dentition. No erythema, swelling, or exudate on post pharynx. Tonsils not swollen or erythematous. Hearing normal.  Neck: Supple, thyroid normal. No bruits  Respiratory: Respiratory effort normal, BS equal bilaterally without rales, rhonchi, wheezing or stridor.  Cardio: RRR without murmurs, rubs or gallops. Brisk peripheral pulses without edema.  Chest: symmetric, with normal excursions and percussion.  Breasts: Symmetric,  without lumps, nipple discharge, retractions.  Abdomen: Soft, nontender, no guarding, rebound, hernias, masses, or organomegaly.  Lymphatics: Non tender without lymphadenopathy.  Musculoskeletal: Full ROM all peripheral extremities, 5/5 strength, and normal gait. Skin: Warm, dry without rashes, lesions, ecchymosis. Neuro: Cranial nerves intact, reflexes equal bilaterally. Normal muscle tone, no cerebellar symptoms. Sensation intact.  Psych: Awake and oriented X 3, normal affect, Insight and Judgment appropriate.   [name] 10:11 AM [facility]

## 2019-03-03 ENCOUNTER — Encounter: Payer: Medicare PPO | Admitting: Physician Assistant

## 2019-03-07 NOTE — Progress Notes (Signed)
CPE  Assessment and Plan: CPE 1 year Make MGM appointment  Type 2 diabetes mellitus with stage 2 chronic kidney disease, without long-term current use of insulin (HCC) -     Hemoglobin A1c Discussed general issues about diabetes pathophysiology and management., Educational material distributed., Suggested low cholesterol diet., Encouraged aerobic exercise., Discussed foot care., Reminded to get yearly retinal exam.  CKD stage 2 due to type 2 diabetes mellitus ([place]) -     Hemoglobin A1c Discussed general issues about diabetes pathophysiology and management., Educational material distributed., Suggested low cholesterol diet., Encouraged aerobic exercise., Discussed foot care., Reminded to get yearly retinal exam.  Morbid obesity ([place]) - follow up 3 months for progress monitoring - increase veggies, decrease carbs - long discussion about weight loss, diet, and exercise  Hyperlipidemia associated with type 2 diabetes mellitus (HCC) -     Lipid panel  Mixed hyperlipidemia check lipids decrease fatty foods increase activity.   Class 2 severe obesity due to excess calories with serious comorbidity and body mass index (BMI) of 38.0 to 38.9 in adult [facility]) - follow up 3 months for progress monitoring - increase veggies, decrease carbs - long discussion about weight loss, diet, and exercise  Essential hypertension -     CBC with Differential/Platelet -     COMPLETE METABOLIC PANEL WITH GFR -     TSH - continue medications, DASH diet, exercise and monitor at home. Call if greater than 130/80.   Acute gout involving toe, unspecified cause, unspecified laterality Stay on allopurinol daily  Vitamin D deficiency -     VITAMIN D 25 Hydroxy (Vit-D Deficiency, Fractures)  Medication management -     Magnesium  Iron deficiency anemia, unspecified iron deficiency anemia type Monitor\  Hair loss ? CCCA pattern, some discomfort- will treat with topical steroid, and can refer to [facility] if not better  Discussed med's effects and SE's. Screening labs and tests as requested with regular follow-up as recommended. Over 30 minutes of exam, counseling, chart review, and complex, high level critical decision making was performed this visit.   Future Appointments  Date Time Provider [place]  03/22/2019  4:00 PM Magnus Sinning, MD [facility] None  06/18/2019 10:30 AM Unk Pinto, [place] [facility] None  03/09/2020  9:00 AM [name], PA-C [facility] None     HPI  69 y.o. AA female, presents for a medicare and follow up. She has Essential hypertension; Hyperlipidemia associated with type 2 diabetes mellitus ([place]); Vitamin D deficiency; Type 2 diabetes with stage 2 chronic kidney disease GFR 60-89 ([place]); Morbid obesity ([place]); Medication management; CKD stage 2 due to type 2 diabetes mellitus ([place]); Situational anxiety; Lumbar radiculopathy; and Alopecia of scalp on their problem list.   She has persistent but intermittent R hip pain, follows Dr. Ninfa Linden, had MRI back with L4-L5 disc bulge on the right to L5- has had several shots- still having pain.   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   Had a visit for hair loss, had iron, zinc, TSH checked then, saw dermatology. She has hair loss at the crown of her head and moving forward.   BMI is Body mass index is 35.92 kg/m., she has been working on weight loss. She has been moving more and got a bicycle at home. Wt Readings from Last 3 Encounters:  03/08/19 187 lb (84.8 kg)  12/22/18 192 lb (87.1 kg)  11/16/18 196 lb 3.2  oz (89 kg)   Her blood pressure has been controlled at home, today their BP is BP: 124/78 She does not workout. She denies chest pain, shortness of breath, dizziness.   She is not on cholesterol medication, currently managed by lifestyle changes. Her cholesterol is at goal. The cholesterol last visit was:   Lab  Results  Component Value Date   CHOL 156 03/08/2019   HDL 51 03/08/2019   LDLCALC 84 03/08/2019   TRIG 113 03/08/2019   CHOLHDL 3.1 03/08/2019   She has been working on diet and exercise for T2DM  currently managed by lifestyle modification  she is on bASA  she is not on ACE/ARB denies increased appetite, nausea, paresthesia of the feet, polydipsia, polyuria, visual disturbances and vomiting.  Last A1C in the office was:  Lab Results  Component Value Date   HGBA1C 6.3 (H) 03/08/2019   Last GFR: Lab Results  Component Value Date   GFRAA 82 03/08/2019   Patient is on Vitamin D supplement but remained below goal at the last check:    Lab Results  Component Value Date   VD25OH 35 03/08/2019      Current Medications:  Current Outpatient Medications on File Prior to Visit  Medication Sig Dispense Refill  . allopurinol (ZYLOPRIM) 300 MG tablet Take 1 tablet daily to prevent Gout 90 tablet 3  . amLODipine (NORVASC) 10 MG tablet TAKE 1 TABLET BY MOUTH EVERY DAY (TAKE 1/2 TO 1 TABLET DEPENDING ON BLOOD PRESSURE READING) 90 tablet 1  . aspirin 81 MG tablet Take 81 mg by mouth daily.    . busPIRone (BUSPAR) 5 MG tablet TAKE 1 TABLET BY MOUTH THREE TIMES A DAY 270 tablet 1  . Cholecalciferol (VITAMIN D3) 25 MCG (1000 UT) CAPS Take 2 cap daily 60 capsule   . cyclobenzaprine (FLEXERIL) 10 MG tablet TAKE 1 TABLET BY MOUTH EVERYDAY AT BEDTIME 40 tablet 0  . hydrochlorothiazide (HYDRODIURIL) 25 MG tablet Take 1 tablet Daily  for BP & Fluid Retention / Ankle Swelling 90 tablet 3  . Magnesium 250 MG TABS Take by mouth daily.     No current facility-administered medications on file prior to visit.   Allergies:  Allergies  Allergen Reactions  . Ziac [Bisoprolol-Hydrochlorothiazide]     Flushing    Medical History:  She has Essential hypertension; Hyperlipidemia associated with type 2 diabetes mellitus ([place]); Vitamin D deficiency; Type 2 diabetes with stage 2 chronic kidney disease GFR  60-89 ([place]); Morbid obesity ([place]); Medication management; CKD stage 2 due to type 2 diabetes mellitus ([place]); Situational anxiety; Lumbar radiculopathy; and Alopecia of scalp on their problem list.   Health Maintenance:   Immunization History  Administered Date(s) Administered  . Influenza, High Dose Seasonal PF 05/04/2018  . PFIZER SARS-COV-2 Vaccination 01/31/2019, 02/27/2019  . PPD Test 04/26/2013   Preventative care: Last colonoscopy: 09/2014 EGD: 09/2014 Dr. Collene Mares Last mammogram: 01/2017, will schedule Last pap smear/pelvic exam: 2016 declines another DEXA: 2013  Prior vaccinations: TD or Tdap: 2009, declines at this        Influenza: declines Pneumococcal: declines Prevnar13: declines Shingles/Zostavax: declines COVID 02/2019 completed   Names of Other Physician/Practitioners you currently use: 1. [facility] here for primary care 2. Dr. Truman Hayward, eye doctor, last visit 09/2018 3. Dr. Sallee Lange, dentist, last visit 2020, q67m- suggest plates  Patient Care Team: Unk Pinto, MD as PCP - General (Internal Medicine) Juanita Craver, MD as Consulting Physician (Gastroenterology)  Surgical History:  She has a past surgical history that includes Cesarean section (1971); Knee arthroscopy (Right); Carpal tunnel release (Right); and Tubal ligation. Family History:  Herfamily history includes Aneurysm in her sister; Diabetes in her brother and brother; Heart disease in her brother and brother; Hypertension in her sister; Stroke in her mother; Thyroid disease in her sister. Social History:  She reports that she has never smoked. She has never used smokeless tobacco. She reports current alcohol use. She reports that she does not use drugs.    Review of Systems: Review of Systems  Constitutional: Negative for malaise/fatigue and weight loss.  HENT: Negative for hearing loss and tinnitus.   Eyes: Negative for blurred vision and double  vision.  Respiratory: Negative for cough, shortness of breath and wheezing.   Cardiovascular: Negative for chest pain, palpitations, orthopnea, claudication and leg swelling.  Gastrointestinal: Negative for abdominal pain, blood in stool, constipation, diarrhea, heartburn, melena, nausea and vomiting.  Genitourinary: Negative.   Musculoskeletal: Positive for joint pain (R hip pain x 2-3 months). Negative for myalgias.  Skin: Negative for rash.  Neurological: Negative for dizziness, tingling, sensory change, weakness and headaches.  Endo/Heme/Allergies: Negative for polydipsia.  Psychiatric/Behavioral: Negative.  Negative for depression. The patient is not nervous/anxious and does not have insomnia.   All other systems reviewed and are negative.   Physical Exam: Estimated body mass index is 35.92 kg/m as calculated from the following:   Height as of this encounter: 5' 0.5" (1.537 m).   Weight as of this encounter: 187 lb (84.8 kg). BP 124/78   Pulse 98   Temp (!) 97.5 F (36.4 C)   Ht 5' 0.5" (1.537 m)   Wt 187 lb (84.8 kg)   SpO2 98%   BMI 35.92 kg/m  General Appearance: Well nourished, in no apparent distress.  Eyes: PERRLA, EOMs, conjunctiva no swelling or erythema, normal fundi and vessels.  Sinuses: No Frontal/maxillary tenderness  ENT/Mouth: Ext aud canals clear, normal light reflex with TMs without erythema, bulging. Good dentition. No erythema, swelling, or exudate on post pharynx. Tonsils not swollen or erythematous. Hearing normal.  Neck: Supple, thyroid normal. No bruits  Respiratory: Respiratory effort normal, BS equal bilaterally without rales, rhonchi, wheezing or stridor.  Cardio: RRR without murmurs, rubs or gallops. Brisk peripheral pulses without edema.  Chest: symmetric, with normal excursions and percussion.  Breasts: Symmetric, without lumps, nipple discharge, retractions.  Abdomen: Soft, nontender, no guarding, rebound, hernias, masses, or organomegaly.   Lymphatics: Non tender without lymphadenopathy.  Musculoskeletal: Full ROM all peripheral extremities, 5/5 strength, and normal gait. Skin: hair loss at crown of head and around the front. Warm, dry without rashes, lesions, ecchymosis. Neuro: Cranial nerves intact, reflexes equal bilaterally. Normal muscle tone, no cerebellar symptoms. Sensation intact.  Psych: Awake and oriented X 3, normal affect, Insight and Judgment appropriate.   [name] 8:50 AM [facility]

## 2019-03-08 ENCOUNTER — Ambulatory Visit: Payer: Medicare PPO | Admitting: Physician Assistant

## 2019-03-08 ENCOUNTER — Encounter: Payer: Self-pay | Admitting: Physician Assistant

## 2019-03-08 ENCOUNTER — Other Ambulatory Visit: Payer: Self-pay

## 2019-03-08 VITALS — BP 124/78 | HR 98 | Temp 97.5°F | Ht 60.5 in | Wt 187.0 lb

## 2019-03-08 DIAGNOSIS — E785 Hyperlipidemia, unspecified: Secondary | ICD-10-CM | POA: Diagnosis not present

## 2019-03-08 DIAGNOSIS — Z0001 Encounter for general adult medical examination with abnormal findings: Secondary | ICD-10-CM

## 2019-03-08 DIAGNOSIS — Z0000 Encounter for general adult medical examination without abnormal findings: Secondary | ICD-10-CM

## 2019-03-08 DIAGNOSIS — Z79899 Other long term (current) drug therapy: Secondary | ICD-10-CM

## 2019-03-08 DIAGNOSIS — I10 Essential (primary) hypertension: Secondary | ICD-10-CM

## 2019-03-08 DIAGNOSIS — E1169 Type 2 diabetes mellitus with other specified complication: Secondary | ICD-10-CM | POA: Diagnosis not present

## 2019-03-08 DIAGNOSIS — Z136 Encounter for screening for cardiovascular disorders: Secondary | ICD-10-CM

## 2019-03-08 DIAGNOSIS — N182 Chronic kidney disease, stage 2 (mild): Secondary | ICD-10-CM | POA: Diagnosis not present

## 2019-03-08 DIAGNOSIS — E1122 Type 2 diabetes mellitus with diabetic chronic kidney disease: Secondary | ICD-10-CM

## 2019-03-08 DIAGNOSIS — F418 Other specified anxiety disorders: Secondary | ICD-10-CM

## 2019-03-08 DIAGNOSIS — E559 Vitamin D deficiency, unspecified: Secondary | ICD-10-CM

## 2019-03-08 DIAGNOSIS — M5416 Radiculopathy, lumbar region: Secondary | ICD-10-CM

## 2019-03-08 DIAGNOSIS — M109 Gout, unspecified: Secondary | ICD-10-CM

## 2019-03-08 DIAGNOSIS — L659 Nonscarring hair loss, unspecified: Secondary | ICD-10-CM

## 2019-03-08 MED ORDER — HALCINONIDE 0.1 % EX SOLN: Freq: Two times a day (BID) | CUTANEOUS | 0 refills | Status: DC

## 2019-03-08 NOTE — Patient Instructions (Addendum)
  May be CCCA hair loss If not better can [facility]  Dr. Theodoro Clock, MD [facility], [address], [place], [place] [address]   HOW TO SCHEDULE A MAMMOGRAM  [facility]  7 a.m.-6:30 p.m., Monday 7 a.m.-5 p.m., Tuesday-Friday Schedule an appointment by calling [phone].   General eating tips  What to Avoid . Avoid added sugars o Often added sugar can be found in processed foods such as many condiments, dry cereals, cakes, cookies, chips, crisps, crackers, candies, sweetened drinks, etc.  o Read labels and AVOID/DECREASE use of foods with the following in their ingredient list: Sugar, fructose, high fructose corn syrup, sucrose, glucose, maltose, dextrose, molasses, cane sugar, brown sugar, any type of syrup, agave nectar, etc.   . Avoid snacking in between meals- drink water or if you feel you need a snack, pick a high water content snack such as cucumbers, watermelon, or any veggie.  Marland Kitchen Avoid foods made with flour o If you are going to eat food made with flour, choose those made with whole-grains; and, minimize your consumption as much as is tolerable . Avoid processed foods o These foods are generally stocked in the middle of the grocery store.  o Focus on shopping on the perimeter of the grocery.  What to Include . Vegetables o GREEN LEAFY VEGETABLES: Kale, spinach, mustard greens, collard greens, cabbage, broccoli, etc. o OTHER: Asparagus, cauliflower, eggplant, carrots, peas, Brussel sprouts, tomatoes, bell peppers, zucchini, beets, cucumbers, etc. . Grains, seeds, and legumes o Beans: kidney beans, black eyed peas, garbanzo beans, black beans, pinto beans, etc. o Whole, unrefined grains: brown rice, barley, bulgur, oatmeal, etc. . Healthy fats  o Avoid highly processed fats such as vegetable oil o Examples of healthy fats: avocado, olives, virgin olive oil, dark chocolate (?72% Cocoa), nuts (peanuts,  almonds, walnuts, cashews, pecans, etc.) o Please still do small amount of these healthy fats, they are dense in calories.  . Low - Moderate Intake of Animal Sources of Protein o Meat sources: chicken, [place], salmon, tuna. Limit to 4 ounces of meat at one time or the size of your palm. o Consider limiting dairy sources, but when choosing dairy focus on: PLAIN [place] yogurt, cottage cheese, high-protein milk . Fruit o Choose berries

## 2019-03-09 LAB — COMPLETE METABOLIC PANEL WITH GFR
AG Ratio: 1.4 (calc) (ref 1.0–2.5)
ALT: 3 U/L — ABNORMAL LOW (ref 6–29)
AST: 12 U/L (ref 10–35)
Albumin: 4.2 g/dL (ref 3.6–5.1)
Alkaline phosphatase (APISO): 60 U/L (ref 37–153)
BUN: 12 mg/dL (ref 7–25)
CO2: 30 mmol/L (ref 20–32)
Calcium: 10.1 mg/dL (ref 8.6–10.4)
Chloride: 102 mmol/L (ref 98–110)
Creat: 0.84 mg/dL (ref 0.50–0.99)
GFR, Est African American: 82 mL/min/{1.73_m2} (ref >=60)
GFR, Est Non African American: 71 mL/min/{1.73_m2} (ref >=60)
Globulin: 2.9 g/dL (calc) (ref 1.9–3.7)
Glucose, Bld: 93 mg/dL (ref 65–99)
Potassium: 3.9 mmol/L (ref 3.5–5.3)
Sodium: 139 mmol/L (ref 135–146)
Total Bilirubin: 0.5 mg/dL (ref 0.2–1.2)
Total Protein: 7.1 g/dL (ref 6.1–8.1)

## 2019-03-09 LAB — URINALYSIS, ROUTINE W REFLEX MICROSCOPIC
Bacteria, UA: NONE SEEN /HPF
Bilirubin Urine: NEGATIVE
Glucose, UA: NEGATIVE
Hgb urine dipstick: NEGATIVE
Hyaline Cast: NONE SEEN /LPF
Ketones, ur: NEGATIVE
Nitrite: NEGATIVE
Protein, ur: NEGATIVE
Specific Gravity, Urine: 1.017 (ref 1.001–1.03)
pH: 5.5 (ref 5.0–8.0)

## 2019-03-09 LAB — LIPID PANEL
Cholesterol: 156 mg/dL (ref <200)
HDL: 51 mg/dL (ref >=50)
LDL Cholesterol (Calc): 84 mg/dL (calc)
Non-HDL Cholesterol (Calc): 105 mg/dL (calc) (ref <130)
Total CHOL/HDL Ratio: 3.1 (calc) (ref <5.0)
Triglycerides: 113 mg/dL (ref <150)

## 2019-03-09 LAB — HEMOGLOBIN A1C
Hgb A1c MFr Bld: 6.3 % of total Hgb — ABNORMAL HIGH (ref <5.7)
Mean Plasma Glucose: 134 (calc)
eAG (mmol/L): 7.4 (calc)

## 2019-03-09 LAB — CBC WITH DIFFERENTIAL/PLATELET
Absolute Monocytes: 781 cells/uL (ref 200–950)
Basophils Absolute: 67 cells/uL (ref 0–200)
Basophils Relative: 0.8 %
Eosinophils Absolute: 420 cells/uL (ref 15–500)
Eosinophils Relative: 5 %
HCT: 44.5 % (ref 35.0–45.0)
Hemoglobin: 14.8 g/dL (ref 11.7–15.5)
Lymphs Abs: 2873 cells/uL (ref 850–3900)
MCH: 28.4 pg (ref 27.0–33.0)
MCHC: 33.3 g/dL (ref 32.0–36.0)
MCV: 85.4 fL (ref 80.0–100.0)
MPV: 10.6 fL (ref 7.5–12.5)
Monocytes Relative: 9.3 %
Neutro Abs: 4259 cells/uL (ref 1500–7800)
Neutrophils Relative %: 50.7 %
Platelets: 276 10*3/uL (ref 140–400)
RBC: 5.21 10*6/uL — ABNORMAL HIGH (ref 3.80–5.10)
RDW: 13 % (ref 11.0–15.0)
Total Lymphocyte: 34.2 %
WBC: 8.4 10*3/uL (ref 3.8–10.8)

## 2019-03-09 LAB — MICROALBUMIN / CREATININE URINE RATIO
Creatinine, Urine: 104 mg/dL (ref 20–275)
Microalb Creat Ratio: 4 mcg/mg creat (ref <30)
Microalb, Ur: 0.4 mg/dL

## 2019-03-09 LAB — VITAMIN D 25 HYDROXY (VIT D DEFICIENCY, FRACTURES): Vit D, 25-Hydroxy: 35 ng/mL (ref 30–100)

## 2019-03-09 LAB — URIC ACID: Uric Acid, Serum: 7.9 mg/dL — ABNORMAL HIGH (ref 2.5–7.0)

## 2019-03-09 LAB — TSH: TSH: 1.49 mIU/L (ref 0.40–4.50)

## 2019-03-09 LAB — MAGNESIUM: Magnesium: 1.9 mg/dL (ref 1.5–2.5)

## 2019-03-22 ENCOUNTER — Ambulatory Visit: Payer: Self-pay

## 2019-03-22 ENCOUNTER — Encounter: Payer: Self-pay | Admitting: Physical Medicine and Rehabilitation

## 2019-03-22 ENCOUNTER — Ambulatory Visit: Payer: Medicare PPO | Admitting: Physical Medicine and Rehabilitation

## 2019-03-22 ENCOUNTER — Other Ambulatory Visit: Payer: Self-pay

## 2019-03-22 VITALS — BP 133/91 | HR 101

## 2019-03-22 DIAGNOSIS — M5416 Radiculopathy, lumbar region: Secondary | ICD-10-CM | POA: Diagnosis not present

## 2019-03-22 NOTE — Progress Notes (Signed)
 .  Numeric Pain Rating Scale and Functional Assessment Average Pain 8   In the last MONTH (on 0-10 scale) has pain interfered with the following?  1. General activity like being  able to carry out your everyday physical activities such as walking, climbing stairs, carrying groceries, or moving a chair?  Rating(8)   +Driver, -BT, -Dye Allergies.  

## 2019-03-25 NOTE — Procedures (Signed)
Lumbosacral Transforaminal Epidural Steroid Injection - Sub-Pedicular Approach with Fluoroscopic Guidance  Patient: Brittney Gray      Date of Birth: [birth year] MRN: [id number] PCP: Unk Pinto, MD      Visit Date: 03/22/2019   Universal Protocol:    Date/Time: 03/22/2019  Consent Given By: the patient  Position: PRONE  Additional Comments: Vital signs were monitored before and after the procedure. Patient was prepped and draped in the usual sterile fashion. The correct patient, procedure, and site was verified.   Injection Procedure Details:  Procedure Site One Meds Administered: No orders of the defined types were placed in this encounter.   Laterality: Right  Location/Site:  L5-S1  Needle size: 41 G  Needle type: Spinal  Needle Placement: Transforaminal  Findings:    -Comments: Excellent flow of contrast along the nerve and into the epidural space.  Procedure Details: After squaring off the end-plates to get a true AP view, the C-arm was positioned so that an oblique view of the foramen as noted above was visualized. The target area is just inferior to the "nose of the scotty dog" or sub pedicular. The soft tissues overlying this structure were infiltrated with 2-3 ml. of 1% Lidocaine without Epinephrine.  The spinal needle was inserted toward the target using a "trajectory" view along the fluoroscope beam.  Under AP and lateral visualization, the needle was advanced so it did not puncture dura and was located close the 6 O'Clock position of the pedical in AP tracterory. Biplanar projections were used to confirm position. Aspiration was confirmed to be negative for CSF and/or blood. A 1-2 ml. volume of Isovue-250 was injected and flow of contrast was noted at each level. Radiographs were obtained for documentation purposes.   After attaining the desired flow of contrast documented above, a 0.5 to 1.0 ml test dose of 0.25% Marcaine was injected into each respective  transforaminal space.  The patient was observed for 90 seconds post injection.  After no sensory deficits were reported, and normal lower extremity motor function was noted,   the above injectate was administered so that equal amounts of the injectate were placed at each foramen (level) into the transforaminal epidural space.   Additional Comments:  The patient tolerated the procedure well Dressing: 2 x 2 sterile gauze and Band-Aid    Post-procedure details: Patient was observed during the procedure. Post-procedure instructions were reviewed.  Patient left the clinic in stable condition.

## 2019-03-25 NOTE — Progress Notes (Signed)
Brittney Gray - 69 y.o. female MRN [id number]  Date of birth: November 29, 1950  Office Visit Note: Visit Date: 03/22/2019 PCP: Unk Pinto, MD Referred by: Unk Pinto, MD  Subjective: Chief Complaint  Patient presents with  . Right Thigh - Pain   HPI:  Brittney Gray is a 69 y.o. female who comes in today For planned right L5 transforaminal epidural steroid injection.  She is followed by Dr. Jean Rosenthal from an orthopedic standpoint in the office.  I saw her on 2 occasions in August 2020 and completed L5-S1 interlaminar epidural steroid injection to treat what we thought was an L5 radicular complaint of the hip and buttock region on the right.  She had disc herniation at L4-5 right paracentral.  She had some mild to moderate narrowing of the canal as well.  This seemed to help at the time according to the notes.  Unfortunately she has continued to complain off-and-on of the same right hip.  We saw her again in October for sacroiliac joint injection at the request of Dr. Ninfa Linden that I don't think helped very much.  After that injection she did experience hair loss that we thought was Telogen effluvium which can occur from any potential stressful situation or post injection post trauma.  She did see her primary care physicians and work-up was done.  She is not complaining of that today.  We will complete transforaminal approach to see if the right L5 nerve can be calm down.  If this is just not helpful my suggestion is either to talk to a spine surgeon for possible discectomy versus chronic pain management.  She has had physical therapy and all other manner of conservative care.  ROS Otherwise per HPI.  Assessment & Plan: Visit Diagnoses:  1. Lumbar radiculopathy     Plan: No additional findings.   Meds & Orders: No orders of the defined types were placed in this encounter.   Orders Placed This Encounter  Procedures  . XR C-ARM NO REPORT  . Epidural Steroid injection     Follow-up: Return if symptoms worsen or fail to improve.   Procedures: No procedures performed  Lumbosacral Transforaminal Epidural Steroid Injection - Sub-Pedicular Approach with Fluoroscopic Guidance  Patient: Brittney Gray      Date of Birth: [birth year] MRN: [id number] PCP: Unk Pinto, MD      Visit Date: 03/22/2019   Universal Protocol:    Date/Time: 03/22/2019  Consent Given By: the patient  Position: PRONE  Additional Comments: Vital signs were monitored before and after the procedure. Patient was prepped and draped in the usual sterile fashion. The correct patient, procedure, and site was verified.   Injection Procedure Details:  Procedure Site One Meds Administered: No orders of the defined types were placed in this encounter.   Laterality: Right  Location/Site:  L5-S1  Needle size: 59 G  Needle type: Spinal  Needle Placement: Transforaminal  Findings:    -Comments: Excellent flow of contrast along the nerve and into the epidural space.  Procedure Details: After squaring off the end-plates to get a true AP view, the C-arm was positioned so that an oblique view of the foramen as noted above was visualized. The target area is just inferior to the "nose of the scotty dog" or sub pedicular. The soft tissues overlying this structure were infiltrated with 2-3 ml. of 1% Lidocaine without Epinephrine.  The spinal needle was inserted toward the target using a "trajectory" view along the fluoroscope beam.  Under AP and  lateral visualization, the needle was advanced so it did not puncture dura and was located close the 6 O'Clock position of the pedical in AP tracterory. Biplanar projections were used to confirm position. Aspiration was confirmed to be negative for CSF and/or blood. A 1-2 ml. volume of Isovue-250 was injected and flow of contrast was noted at each level. Radiographs were obtained for documentation purposes.   After attaining the desired flow of  contrast documented above, a 0.5 to 1.0 ml test dose of 0.25% Marcaine was injected into each respective transforaminal space.  The patient was observed for 90 seconds post injection.  After no sensory deficits were reported, and normal lower extremity motor function was noted,   the above injectate was administered so that equal amounts of the injectate were placed at each foramen (level) into the transforaminal epidural space.   Additional Comments:  The patient tolerated the procedure well Dressing: 2 x 2 sterile gauze and Band-Aid    Post-procedure details: Patient was observed during the procedure. Post-procedure instructions were reviewed.  Patient left the clinic in stable condition.      Clinical History: MRI LUMBAR SPINE WITHOUT CONTRAST  TECHNIQUE: Multiplanar, multisequence MR imaging of the lumbar spine was performed. No intravenous contrast was administered.  COMPARISON: 06/22/2018 radiography  FINDINGS: Segmentation: 5 lumbar type vertebrae  Alignment: Borderline anterolisthesis at L4-5.  Vertebrae: No fracture, evidence of discitis, or bone lesion.  Conus medullaris and cauda equina: Conus extends to the T12-L1 level. Conus and cauda equina appear normal.  Paraspinal and other soft tissues: Negative  Disc levels:  T12- L1: Unremarkable.  L1-L2: Unremarkable.  L2-L3: Minor disc bulging  L3-L4: Mild disc narrowing and desiccation with bulge. No impingement. Mild spinal stenosis  L4-L5: Disc narrowing and bulging with a right paracentral protrusion impinging on the descending L5 nerve root. Spinal stenosis is mild to moderate  L5-S1:Disc narrowing with primarily far-lateral spurring and bulging.  IMPRESSION: 1. Symptomatic finding is likely L4-5 right paracentral protrusion which impinges on the descending L5 nerve root. 2. Noncompressive disc degeneration at L3-4 and L4-5.   Electronically Signed By: Monte Fantasia M.D. On: 07/04/2018 08:53      Objective:  VS:  HT:    WT:   BMI:     BP:(!) 133/91  HR:(!) 101bpm  TEMP: ( )  RESP:  Physical Exam  Ortho Exam Imaging: No results found.

## 2019-05-20 ENCOUNTER — Other Ambulatory Visit: Payer: Self-pay | Admitting: Internal Medicine

## 2019-05-20 DIAGNOSIS — M109 Gout, unspecified: Secondary | ICD-10-CM

## 2019-06-18 ENCOUNTER — Ambulatory Visit: Payer: Medicare PPO | Admitting: Internal Medicine

## 2019-06-18 ENCOUNTER — Ambulatory Visit: Payer: Medicare PPO | Admitting: Physician Assistant

## 2019-06-18 ENCOUNTER — Other Ambulatory Visit: Payer: Self-pay

## 2019-06-18 ENCOUNTER — Encounter: Payer: Self-pay | Admitting: Physician Assistant

## 2019-06-18 VITALS — BP 134/76 | HR 103 | Temp 97.3°F | Ht 60.5 in | Wt 191.0 lb

## 2019-06-18 DIAGNOSIS — M25561 Pain in right knee: Secondary | ICD-10-CM | POA: Diagnosis not present

## 2019-06-18 DIAGNOSIS — Z79899 Other long term (current) drug therapy: Secondary | ICD-10-CM | POA: Diagnosis not present

## 2019-06-18 DIAGNOSIS — L659 Nonscarring hair loss, unspecified: Secondary | ICD-10-CM

## 2019-06-18 DIAGNOSIS — F418 Other specified anxiety disorders: Secondary | ICD-10-CM | POA: Diagnosis not present

## 2019-06-18 DIAGNOSIS — R6889 Other general symptoms and signs: Secondary | ICD-10-CM | POA: Diagnosis not present

## 2019-06-18 DIAGNOSIS — M5416 Radiculopathy, lumbar region: Secondary | ICD-10-CM | POA: Diagnosis not present

## 2019-06-18 DIAGNOSIS — E785 Hyperlipidemia, unspecified: Secondary | ICD-10-CM | POA: Diagnosis not present

## 2019-06-18 DIAGNOSIS — G8929 Other chronic pain: Secondary | ICD-10-CM

## 2019-06-18 DIAGNOSIS — Z0000 Encounter for general adult medical examination without abnormal findings: Secondary | ICD-10-CM

## 2019-06-18 DIAGNOSIS — E1169 Type 2 diabetes mellitus with other specified complication: Secondary | ICD-10-CM | POA: Diagnosis not present

## 2019-06-18 DIAGNOSIS — Z0001 Encounter for general adult medical examination with abnormal findings: Secondary | ICD-10-CM | POA: Diagnosis not present

## 2019-06-18 DIAGNOSIS — E1122 Type 2 diabetes mellitus with diabetic chronic kidney disease: Secondary | ICD-10-CM | POA: Diagnosis not present

## 2019-06-18 DIAGNOSIS — I10 Essential (primary) hypertension: Secondary | ICD-10-CM | POA: Diagnosis not present

## 2019-06-18 DIAGNOSIS — E559 Vitamin D deficiency, unspecified: Secondary | ICD-10-CM | POA: Diagnosis not present

## 2019-06-18 DIAGNOSIS — M25562 Pain in left knee: Secondary | ICD-10-CM

## 2019-06-18 DIAGNOSIS — N182 Chronic kidney disease, stage 2 (mild): Secondary | ICD-10-CM | POA: Diagnosis not present

## 2019-06-18 MED ORDER — DICLOFENAC SODIUM 1 % EX GEL: 4.0000 g | Freq: Four times a day (QID) | CUTANEOUS | 3 refills | Status: AC

## 2019-06-18 MED ORDER — TOPIRAMATE 50 MG PO TABS: 50.0000 mg | ORAL_TABLET | Freq: Every day | ORAL | 2 refills | Status: DC

## 2019-06-18 NOTE — Progress Notes (Signed)
MEDICARE WELLNESS  Assessment and Plan: MEDICARE WELLNESS 1 year Make MGM appointment  Type 2 diabetes mellitus with stage 2 chronic kidney disease, without long-term current use of insulin (HCC) -     Hemoglobin A1c Discussed general issues about diabetes pathophysiology and management., Educational material distributed., Suggested low cholesterol diet., Encouraged aerobic exercise., Discussed foot care., Reminded to get yearly retinal exam.  CKD stage 2 due to type 2 diabetes mellitus ([place]) -     Hemoglobin A1c Discussed general issues about diabetes pathophysiology and management., Educational material distributed., Suggested low cholesterol diet., Encouraged aerobic exercise., Discussed foot care., Reminded to get yearly retinal exam.  Morbid obesity ([place]) - follow up 3 months for progress monitoring - increase veggies, decrease carbs - long discussion about weight loss, diet, and exercise  Hyperlipidemia associated with type 2 diabetes mellitus ([place]) -     Lipid panel  Mixed hyperlipidemia check lipids decrease fatty foods increase activity.   Class 2 severe obesity due to excess calories with serious comorbidity and body mass index (BMI) of 38.0 to 38.9 in adult [facility]) - follow up 3 months for progress monitoring - increase veggies, decrease carbs - long discussion about weight loss, diet, and exercise -     topiramate (TOPAMAX) 50 MG tablet; Take 1 tablet (50 mg total) by mouth at bedtime.  Essential hypertension -     CBC with Differential/Platelet -     COMPLETE METABOLIC PANEL WITH GFR -     TSH - continue medications, DASH diet, exercise and monitor at home. Call if greater than 130/80.   Acute gout involving toe, unspecified cause, unspecified laterality Stay on allopurinol daily  Vitamin D deficiency -     VITAMIN D 25 Hydroxy (Vit-D Deficiency, Fractures)  Medication management -     Magnesium  Iron deficiency anemia, unspecified iron deficiency anemia  type Monitor  Lumbar radiculopathy -     topiramate (TOPAMAX) 50 MG tablet; Take 1 tablet (50 mg total) by mouth at bedtime. Willing to send for second opinion if she needs it Follow up ortho Weight loss advised  Alopecia of scalp Continue follow up derm  Chronic pain of both knees -     diclofenac Sodium (VOLTAREN) 1 % GEL; Apply 4 g topically 4 (four) times daily.  Discussed med's effects and SE's. Screening labs and tests as requested with regular follow-up as recommended. Over 30 minutes of exam, counseling, chart review, and complex, high level critical decision making was performed this visit.   Future Appointments  Date Time Provider [place]  09/23/2019  4:00 PM Unk Pinto, MD [facility] None  03/09/2020  9:00 AM [name], PA-C [facility] None    Medicare Attestation I have personally reviewed: The patient's medical and social history Their use of alcohol, tobacco or illicit drugs Their current medications and supplements The patient's functional ability including ADLs,fall risks, home safety risks, cognitive, and hearing and visual impairment Diet and physical activities Evidence for depression or mood disorders  The patient's weight, height, BMI, and visual acuity have been recorded in the chart.  I have made referrals, counseling, and provided education to the patient based on review of the above and I have provided the patient with a written personalized care plan for preventive services.    MEDICARE WELLNESS OBJECTIVES: Physical activity:   Cardiac risk factors:   Depression/mood screen:   Depression screen [facility] 2/9 03/08/2019  Decreased Interest 0  Down, Depressed, Hopeless 0  PHQ - 2 Score 0  Altered sleeping 0  Tired, decreased energy 0  Change in appetite 0  Feeling bad or failure about yourself  0  Trouble concentrating 0  Moving slowly or fidgety/restless 0  Suicidal thoughts 0  PHQ-9 Score 0  Difficult doing work/chores Not  difficult at all    ADLs:  In your present state of health, do you have any difficulty performing the following activities: 08/04/2018  Hearing? N  Vision? N  Difficulty concentrating or making decisions? N  Walking or climbing stairs? N  Dressing or bathing? N  Doing errands, shopping? N  Some recent data might be hidden     Cognitive Testing  Alert? Yes  Normal Appearance?Yes  Oriented to person? Yes  Place? Yes   Time? Yes  Recall of three objects?  Yes  Can perform simple calculations? Yes  Displays appropriate judgment?Yes  Can read the correct time from a watch face?Yes  EOL planning: Does Patient Have a Medical Advance Directive?: No Would patient like information on creating a medical advance directive?: Yes (MAU/Ambulatory/Procedural Areas - Information given)     HPI  69 y.o. AA female, presents for a cpe and follow up. She has Essential hypertension; Hyperlipidemia associated with type 2 diabetes mellitus ([place]); Vitamin D deficiency; Type 2 diabetes with stage 2 chronic kidney disease GFR 60-89 ([place]); Morbid obesity ([place]); Medication management; CKD stage 2 due to type 2 diabetes mellitus ([place]); Situational anxiety; Lumbar radiculopathy; and Alopecia of scalp on their problem list.   She has persistent but intermittent R hip pain, follows Dr. Ninfa Linden, had MRI back with L4-L5 disc bulge on the right to L5- has had several shots- still having pain. She would like to get a second opinion, she will let us know who she wants to go to.   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   Had a visit for hair loss, had iron, zinc, TSH checked then, saw dermatology- states it is improving.   BMI is Body mass index is 36.69 kg/m., she has been working on weight loss. She has been moving more and got a bicycle at home. Wt Readings from Last 3 Encounters:  06/18/19 191 lb (86.6 kg)  03/08/19 187 lb (84.8 kg)   12/22/18 192 lb (87.1 kg)   Her blood pressure has been controlled at home, today their BP is BP: 134/76 She does not workout. She denies chest pain, shortness of breath, dizziness.   She is not on cholesterol medication, currently managed by lifestyle changes. Her cholesterol is at goal. The cholesterol last visit was:   Lab Results  Component Value Date   CHOL 156 03/08/2019   HDL 51 03/08/2019   LDLCALC 84 03/08/2019   TRIG 113 03/08/2019   CHOLHDL 3.1 03/08/2019   She has been working on diet and exercise for T2DM  currently managed by lifestyle modification DEE Dr. Truman Hayward [facility] 12/2018  she is on bASA  she is not on ACE/ARB denies increased appetite, nausea, paresthesia of the feet, polydipsia, polyuria, visual disturbances and vomiting.  Last A1C in the office was:  Lab Results  Component Value Date   HGBA1C 6.3 (H) 03/08/2019   Last GFR: Lab Results  Component Value Date   GFRAA 82 03/08/2019   Patient is on Vitamin D supplement but remained below goal at the last check:    Lab Results  Component Value Date   VD25OH 35 03/08/2019  Current Medications:  Current Outpatient Medications on File Prior to Visit  Medication Sig Dispense Refill  . allopurinol (ZYLOPRIM) 300 MG tablet TAKE 1 TABLET DAILY TO PREVENT GOUT 90 tablet 3  . amLODipine (NORVASC) 10 MG tablet TAKE 1 TABLET BY MOUTH EVERY DAY (TAKE 1/2 TO 1 TABLET DEPENDING ON BLOOD PRESSURE READING) 90 tablet 1  . aspirin 81 MG tablet Take 81 mg by mouth daily.    . busPIRone (BUSPAR) 5 MG tablet TAKE 1 TABLET BY MOUTH THREE TIMES A DAY 270 tablet 1  . Cholecalciferol (VITAMIN D3) 25 MCG (1000 UT) CAPS Take 2 cap daily 60 capsule   . cyclobenzaprine (FLEXERIL) 10 MG tablet TAKE 1 TABLET BY MOUTH EVERYDAY AT BEDTIME 40 tablet 0  . halcinonide (HALOG) 0.1 % external solution Apply topically 2 (two) times daily. 20 mL 0  . hydrochlorothiazide (HYDRODIURIL) 25 MG tablet Take 1 tablet Daily  for BP & Fluid  Retention / Ankle Swelling 90 tablet 3  . Magnesium 250 MG TABS Take by mouth daily.     No current facility-administered medications on file prior to visit.   Allergies:  Allergies  Allergen Reactions  . Ziac [Bisoprolol-Hydrochlorothiazide]     Flushing    Medical History:  She has Essential hypertension; Hyperlipidemia associated with type 2 diabetes mellitus ([place]); Vitamin D deficiency; Type 2 diabetes with stage 2 chronic kidney disease GFR 60-89 ([place]); Morbid obesity ([place]); Medication management; CKD stage 2 due to type 2 diabetes mellitus ([place]); Situational anxiety; Lumbar radiculopathy; and Alopecia of scalp on their problem list.   Health Maintenance:   Immunization History  Administered Date(s) Administered  . Influenza, High Dose Seasonal PF 05/04/2018  . PFIZER SARS-COV-2 Vaccination 01/31/2019, 02/27/2019  . PPD Test 04/26/2013   Health Maintenance  Topic Date Due  . PNA vac Low Risk Adult (1 of 2 - PCV13) Never done  . OPHTHALMOLOGY EXAM  08/28/2017  . TETANUS/TDAP  11/18/2017  . FOOT EXAM  01/16/2019  . MAMMOGRAM  01/22/2019  . HEMOGLOBIN A1C  09/08/2019  . URINE MICROALBUMIN  03/07/2020  . COLONOSCOPY  09/18/2024  . DEXA SCAN  Completed  . COVID-19 Vaccine  Completed  . Hepatitis C Screening  Completed  . INFLUENZA VACCINE  Discontinued    Preventative care: Last colonoscopy: 09/2014 EGD: 09/2014 Dr. Collene Mares Last mammogram: 01/2018, will schedule OVERDUE Last pap smear/pelvic exam: 2016 declines another DEXA: 2013  Prior vaccinations: TD or Tdap: 2009, declines at this        Influenza: declines Pneumococcal: declines Prevnar13: declines Shingles/Zostavax: declines COVID 02/2019 completed   Names of Other Physician/Practitioners you currently use: 1. [facility] here for primary care 2. Dr. Truman Hayward, eye doctor, last visit 10/2018 3. Dr. Sallee Lange, dentist, last visit 2020, q5m- suggest plates  Patient Care  Team: Unk Pinto, MD as PCP - General (Internal Medicine) Juanita Craver, MD as Consulting Physician (Gastroenterology)  Surgical History:  She has a past surgical history that includes Cesarean section (1971); Knee arthroscopy (Right); Carpal tunnel release (Right); and Tubal ligation. Family History:  Herfamily history includes Aneurysm in her sister; Diabetes in her brother and brother; Heart disease in her brother and brother; Hypertension in her sister; Stroke in her mother; Thyroid disease in her sister. Social History:  She reports that she has never smoked. She has never used smokeless tobacco. She reports current alcohol use. She reports that she does not use drugs.    Review of Systems: Review of  Systems  Constitutional: Negative for malaise/fatigue and weight loss.  HENT: Negative for hearing loss and tinnitus.   Eyes: Negative for blurred vision and double vision.  Respiratory: Negative for cough, shortness of breath and wheezing.   Cardiovascular: Negative for chest pain, palpitations, orthopnea, claudication and leg swelling.  Gastrointestinal: Negative for abdominal pain, blood in stool, constipation, diarrhea, heartburn, melena, nausea and vomiting.  Genitourinary: Negative.   Musculoskeletal: Positive for joint pain (R hip pain x 2-3 months). Negative for myalgias.  Skin: Negative for rash.  Neurological: Negative for dizziness, tingling, sensory change, weakness and headaches.  Endo/Heme/Allergies: Negative for polydipsia.  Psychiatric/Behavioral: Negative.  Negative for depression. The patient is not nervous/anxious and does not have insomnia.   All other systems reviewed and are negative.   Physical Exam: Estimated body mass index is 36.69 kg/m as calculated from the following:   Height as of this encounter: 5' 0.5" (1.537 m).   Weight as of this encounter: 191 lb (86.6 kg). BP 134/76   Pulse (!) 103   Temp (!) 97.3 F (36.3 C)   Ht 5' 0.5" (1.537 m)    Wt 191 lb (86.6 kg)   SpO2 98%   BMI 36.69 kg/m  General Appearance: Well nourished, in no apparent distress.  Eyes: PERRLA, EOMs, conjunctiva no swelling or erythema, normal fundi and vessels.  Sinuses: No Frontal/maxillary tenderness  ENT/Mouth: Ext aud canals clear, normal light reflex with TMs without erythema, bulging. Good dentition. No erythema, swelling, or exudate on post pharynx. Tonsils not swollen or erythematous. Hearing normal.  Neck: Supple, thyroid normal. No bruits  Respiratory: Respiratory effort normal, BS equal bilaterally without rales, rhonchi, wheezing or stridor.  Cardio: RRR without murmurs, rubs or gallops. Brisk peripheral pulses without edema.  Chest: symmetric, with normal excursions and percussion.  Breasts: Symmetric, without lumps, nipple discharge, retractions.  Abdomen: Soft, nontender, no guarding, rebound, hernias, masses, or organomegaly.  Lymphatics: Non tender without lymphadenopathy.  Musculoskeletal: Full ROM all peripheral extremities, 5/5 strength, and normal gait. Skin: hair loss at crown of head and around the front. Warm, dry without rashes, lesions, ecchymosis. Neuro: Cranial nerves intact, reflexes equal bilaterally. Normal muscle tone, no cerebellar symptoms. Sensation intact.  Psych: Awake and oriented X 3, normal affect, Insight and Judgment appropriate.   [name] 10:49 AM [facility]

## 2019-06-18 NOTE — Patient Instructions (Addendum)
HOW TO SCHEDULE A MAMMOGRAM  [facility]  7 a.m.-6:30 p.m., Monday 7 a.m.-5 p.m., Tuesday-Friday Schedule an appointment by calling [phone].   Let me know if and when you want the second opinion and if you need our referral  TOPAMAX Going to start you on topamax, start on 1/2 pill for 3-5 nights, can increase to a whole pill for 1-2 weeks.  Can increase to 1 pill at night and 1/2-1 pill in the AM.  This medication is good for weight loss, headaches, pain This medication can cause numbness, tingling and can cause brain fog- stop if you get these If you get blurry vision or have a history of glaucoma please stop this medication.  Let me know how you are doing with this.   Topiramate tablets What is this medicine? TOPIRAMATE (toe PYRE a mate) is used to treat seizures in adults or children with epilepsy. It is also used for the prevention of migraine headaches. This medicine may be used for other purposes; ask your health care provider or pharmacist if you have questions. COMMON BRAND NAME(S): Topamax, Topiragen What should I tell my health care provider before I take this medicine? They need to know if you have any of these conditions: -bleeding disorders -cirrhosis of the liver or liver disease -diarrhea -glaucoma -kidney stones or kidney disease -low blood counts, like low white cell, platelet, or red cell counts -lung disease like asthma, obstructive pulmonary disease, emphysema -metabolic acidosis -on a ketogenic diet -schedule for surgery or a procedure -suicidal thoughts, plans, or attempt; a previous suicide attempt by you or a family member -an unusual or allergic reaction to topiramate, other medicines, foods, dyes, or preservatives -pregnant or trying to get pregnant -breast-feeding How should I use this medicine? Take this medicine by mouth with a glass of water. Follow the directions on the prescription label. Do not crush or chew.  You may take this medicine with meals. Take your medicine at regular intervals. Do not take it more often than directed. Talk to your pediatrician regarding the use of this medicine in children. Special care may be needed. While this drug may be prescribed for children as young as 73 years of age for selected conditions, precautions do apply. Overdosage: If you think you have taken too much of this medicine contact a poison control center or emergency room at once. NOTE: This medicine is only for you. Do not share this medicine with others. What if I miss a dose? If you miss a dose, take it as soon as you can. If your next dose is to be taken in less than 6 hours, then do not take the missed dose. Take the next dose at your regular time. Do not take double or extra doses. What may interact with this medicine? Do not take this medicine with any of the following medications: -probenecid This medicine may also interact with the following medications: -acetazolamide -alcohol -amitriptyline -aspirin and aspirin-like medicines -birth control pills -certain medicines for depression -certain medicines for seizures -certain medicines that treat or prevent blood clots like warfarin, enoxaparin, dalteparin, apixaban, dabigatran, and rivaroxaban -digoxin -hydrochlorothiazide -lithium -medicines for pain, sleep, or muscle relaxation -metformin -methazolamide -NSAIDS, medicines for pain and inflammation, like ibuprofen or naproxen -pioglitazone -risperidone This list may not describe all possible interactions. Give your health care provider a list of all the medicines, herbs, non-prescription drugs, or dietary supplements you use. Also tell them if you smoke, drink alcohol, or use  illegal drugs. Some items may interact with your medicine. What should I watch for while using this medicine? Visit your doctor or health care professional for regular checks on your progress. Do not stop taking this medicine  suddenly. This increases the risk of seizures if you are using this medicine to control epilepsy. Wear a medical identification bracelet or chain to say you have epilepsy or seizures, and carry a card that lists all your medicines. This medicine can decrease sweating and increase your body temperature. Watch for signs of deceased sweating or fever, especially in children. Avoid extreme heat, hot baths, and saunas. Be careful about exercising, especially in hot weather. Contact your health care provider right away if you notice a fever or decrease in sweating. You should drink plenty of fluids while taking this medicine. If you have had kidney stones in the past, this will help to reduce your chances of forming kidney stones. If you have stomach pain, with nausea or vomiting and yellowing of your eyes or skin, call your doctor immediately. You may get drowsy, dizzy, or have blurred vision. Do not drive, use machinery, or do anything that needs mental alertness until you know how this medicine affects you. To reduce dizziness, do not sit or stand up quickly, especially if you are an older patient. Alcohol can increase drowsiness and dizziness. Avoid alcoholic drinks. If you notice blurred vision, eye pain, or other eye problems, seek medical attention at once for an eye exam. The use of this medicine may increase the chance of suicidal thoughts or actions. Pay special attention to how you are responding while on this medicine. Any worsening of mood, or thoughts of suicide or dying should be reported to your health care professional right away. This medicine may increase the chance of developing metabolic acidosis. If left untreated, this can cause kidney stones, bone disease, or slowed growth in children. Symptoms include breathing fast, fatigue, loss of appetite, irregular heartbeat, or loss of consciousness. Call your doctor immediately if you experience any of these side effects. Also, tell your doctor about  any surgery you plan on having while taking this medicine since this may increase your risk for metabolic acidosis. Birth control pills may not work properly while you are taking this medicine. Talk to your doctor about using an extra method of birth control. Women who become pregnant while using this medicine may enroll in the [place] Pregnancy Registry by calling [phone]. This registry collects information about the safety of antiepileptic drug use during pregnancy. What side effects may I notice from receiving this medicine? Side effects that you should report to your doctor or health care professional as soon as possible: -allergic reactions like skin rash, itching or hives, swelling of the face, lips, or tongue -decreased sweating and/or rise in body temperature -depression -difficulty breathing, fast or irregular breathing patterns -difficulty speaking -difficulty walking or controlling muscle movements -hearing impairment -redness, blistering, peeling or loosening of the skin, including inside the mouth -tingling, pain or numbness in the hands or feet -unusual bleeding or bruising -unusually weak or tired -worsening of mood, thoughts or actions of suicide or dying Side effects that usually do not require medical attention (report to your doctor or health care professional if they continue or are bothersome): -altered taste -back pain, joint or muscle aches and pains -diarrhea, or constipation -headache -loss of appetite -nausea -stomach upset, indigestion -tremors This list may not describe all possible side effects. Call your doctor for medical  advice about side effects. You may report side effects to FDA at 1-800-FDA-1088. Where should I keep my medicine? Keep out of the reach of children. Store at room temperature between 15 and 30 degrees C (59 and 86 degrees F) in a tightly closed container. Protect from moisture. Throw away any unused  medicine after the expiration date. NOTE: This sheet is a summary. It may not cover all possible information. If you have questions about this medicine, talk to your doctor, pharmacist, or health care provider.  2018 Elsevier/Gold Standard (2012-12-28 23:17:57)

## 2019-06-19 LAB — COMPLETE METABOLIC PANEL WITH GFR
AG Ratio: 1.7 (calc) (ref 1.0–2.5)
ALT: 3 U/L — ABNORMAL LOW (ref 6–29)
AST: 13 U/L (ref 10–35)
Albumin: 4.3 g/dL (ref 3.6–5.1)
Alkaline phosphatase (APISO): 68 U/L (ref 37–153)
BUN: 20 mg/dL (ref 7–25)
CO2: 30 mmol/L (ref 20–32)
Calcium: 10.2 mg/dL (ref 8.6–10.4)
Chloride: 102 mmol/L (ref 98–110)
Creat: 0.97 mg/dL (ref 0.50–0.99)
GFR, Est African American: 69 mL/min/{1.73_m2} (ref >=60)
GFR, Est Non African American: 60 mL/min/{1.73_m2} (ref >=60)
Globulin: 2.6 g/dL (calc) (ref 1.9–3.7)
Glucose, Bld: 98 mg/dL (ref 65–99)
Potassium: 3.8 mmol/L (ref 3.5–5.3)
Sodium: 140 mmol/L (ref 135–146)
Total Bilirubin: 0.6 mg/dL (ref 0.2–1.2)
Total Protein: 6.9 g/dL (ref 6.1–8.1)

## 2019-06-19 LAB — HEMOGLOBIN A1C
Hgb A1c MFr Bld: 5.9 % of total Hgb — ABNORMAL HIGH (ref <5.7)
Mean Plasma Glucose: 123 (calc)
eAG (mmol/L): 6.8 (calc)

## 2019-06-19 LAB — CBC WITH DIFFERENTIAL/PLATELET
Absolute Monocytes: 688 cells/uL (ref 200–950)
Basophils Absolute: 56 cells/uL (ref 0–200)
Basophils Relative: 0.7 %
Eosinophils Absolute: 248 cells/uL (ref 15–500)
Eosinophils Relative: 3.1 %
HCT: 43.9 % (ref 35.0–45.0)
Hemoglobin: 14.4 g/dL (ref 11.7–15.5)
Lymphs Abs: 2328 cells/uL (ref 850–3900)
MCH: 29.1 pg (ref 27.0–33.0)
MCHC: 32.8 g/dL (ref 32.0–36.0)
MCV: 88.7 fL (ref 80.0–100.0)
MPV: 10.5 fL (ref 7.5–12.5)
Monocytes Relative: 8.6 %
Neutro Abs: 4680 cells/uL (ref 1500–7800)
Neutrophils Relative %: 58.5 %
Platelets: 292 10*3/uL (ref 140–400)
RBC: 4.95 10*6/uL (ref 3.80–5.10)
RDW: 13.6 % (ref 11.0–15.0)
Total Lymphocyte: 29.1 %
WBC: 8 10*3/uL (ref 3.8–10.8)

## 2019-06-19 LAB — TSH: TSH: 1.7 mIU/L (ref 0.40–4.50)

## 2019-06-19 LAB — LIPID PANEL
Cholesterol: 161 mg/dL (ref <200)
HDL: 55 mg/dL (ref >=50)
LDL Cholesterol (Calc): 85 mg/dL (calc)
Non-HDL Cholesterol (Calc): 106 mg/dL (calc) (ref <130)
Total CHOL/HDL Ratio: 2.9 (calc) (ref <5.0)
Triglycerides: 112 mg/dL (ref <150)

## 2019-06-24 ENCOUNTER — Telehealth: Payer: Self-pay | Admitting: Physician Assistant

## 2019-06-24 DIAGNOSIS — M5416 Radiculopathy, lumbar region: Secondary | ICD-10-CM

## 2019-06-24 NOTE — Telephone Encounter (Signed)
-----   Message from Elenor Quinones, [place] sent at 06/24/2019  2:21 PM EDT ----- Patient has been made aware.  Patient states that you wanted her to call w/ information: DR Dietrich Pates on [facility]   [phone]

## 2019-07-14 ENCOUNTER — Other Ambulatory Visit: Payer: Self-pay | Admitting: Internal Medicine

## 2019-07-14 DIAGNOSIS — Z1231 Encounter for screening mammogram for malignant neoplasm of breast: Secondary | ICD-10-CM

## 2019-07-30 ENCOUNTER — Other Ambulatory Visit: Payer: Self-pay | Admitting: Adult Health

## 2019-07-30 DIAGNOSIS — M533 Sacrococcygeal disorders, not elsewhere classified: Secondary | ICD-10-CM | POA: Diagnosis not present

## 2019-07-30 DIAGNOSIS — M5416 Radiculopathy, lumbar region: Secondary | ICD-10-CM | POA: Diagnosis not present

## 2019-07-30 DIAGNOSIS — M545 Low back pain: Secondary | ICD-10-CM | POA: Diagnosis not present

## 2019-08-12 ENCOUNTER — Ambulatory Visit
Admission: RE | Admit: 2019-08-12 | Discharge: 2019-08-12 | Disposition: A | Discharge status: Home / Self Care | Payer: Medicare PPO | Source: Ambulatory Visit | Attending: Internal Medicine | Admitting: Internal Medicine

## 2019-08-12 ENCOUNTER — Other Ambulatory Visit: Payer: Self-pay

## 2019-08-12 DIAGNOSIS — Z1231 Encounter for screening mammogram for malignant neoplasm of breast: Secondary | ICD-10-CM

## 2019-09-09 ENCOUNTER — Other Ambulatory Visit: Payer: Self-pay | Admitting: Physician Assistant

## 2019-09-14 ENCOUNTER — Ambulatory Visit: Payer: Self-pay

## 2019-09-14 NOTE — Telephone Encounter (Signed)
Patient called stating that her grandson was at her home yesterday and he was tested positive for COVID-19 last week  She states that today he tested negative via a home test.  She was close to him for about 25 minutes. He was kneeling beside her showing her something on her phone. She has no symptoms. She states that she has schedule appointment with [facility] for Thursday for testing. I suggested that she not test for at least 5-7 day post exposure and stay home monitor for symptoms.  We reviewed most common symptoms. She was told symptoms can start 2-14 days post exposure and she should stay home and not go to work for 14 days .  She verbalized understanding of all information. She was also told to call her PCP as well for their advice and especially if symptoms develop. Care advice was reviewed. She verbalized understanding of all information.  Reason for Disposition . [1] CLOSE CONTACT COVID-19 EXPOSURE within last 14 days AND [2] NO symptoms  Answer Assessment - Initial Assessment Questions 1. COVID-19 CLOSE CONTACT: "Who is the person with the confirmed or suspected COVID-19 infection that you were exposed to?"     grandson 2. PLACE of CONTACT: "Where were you when you were exposed to COVID-19?" (e.g., home, school, medical waiting room; which city?)     Home for at least 15 minutes 3. TYPE of CONTACT: "How much contact was there?" (e.g., sitting next to, live in same house, work in same office, same building)     Showing her his phone yesterday 4. DURATION of CONTACT: "How long were you in contact with the COVID-19 patient?" (e.g., a few seconds, passed by person, a few minutes, 15 minutes or longer, live with the patient)     About 15 5. MASK: "Were you wearing a mask?" "Was the other person wearing a mask?" Note: wearing a mask reduces the risk of an otherwise close contact.    no 6. DATE of CONTACT: "When did you have contact with a COVID-19 patient?" (e.g., how many days ago)    yesterday 7.  COMMUNITY SPREAD: "Are there lots of cases of COVID-19 (community spread) where you live?" (See public health department website, if unsure)      [place] 8. SYMPTOMS: "Do you have any symptoms?" (e.g., fever, cough, breathing difficulty, loss of taste or smell)     None 9. PREGNANCY OR POSTPARTUM: "Is there any chance you are pregnant?" "When was your last menstrual period?" "Did you deliver in the last 2 weeks?"     N/A 10. HIGH RISK: "Do you have any heart or lung problems?" "Do you have a weak immune system?" (e.g., heart failure, COPD, asthma, HIV positive, chemotherapy, renal failure, diabetes mellitus, sickle cell anemia, obesity)       no 11. TRAVEL: "Have you traveled out of the country recently?" If Yes, ask: "When and where?" Also ask about out-of-state travel, since the CDC has identified some high-risk cities for community spread in the [place]. Note: Travel becomes less relevant if there is widespread community transmission where the patient lives.       N/A  Protocols used: CORONAVIRUS (COVID-19) EXPOSURE-A-AH

## 2019-09-16 DIAGNOSIS — Z20822 Contact with and (suspected) exposure to covid-19: Secondary | ICD-10-CM | POA: Diagnosis not present

## 2019-09-23 ENCOUNTER — Encounter: Payer: Self-pay | Admitting: Adult Health

## 2019-09-23 ENCOUNTER — Other Ambulatory Visit: Payer: Self-pay

## 2019-09-23 ENCOUNTER — Ambulatory Visit (INDEPENDENT_AMBULATORY_CARE_PROVIDER_SITE_OTHER): Payer: Medicare PPO | Admitting: Adult Health

## 2019-09-23 VITALS — BP 132/84 | HR 72 | Temp 97.0°F | Resp 16 | Ht 59.5 in | Wt 191.6 lb

## 2019-09-23 DIAGNOSIS — N182 Chronic kidney disease, stage 2 (mild): Secondary | ICD-10-CM

## 2019-09-23 DIAGNOSIS — E1169 Type 2 diabetes mellitus with other specified complication: Secondary | ICD-10-CM | POA: Diagnosis not present

## 2019-09-23 DIAGNOSIS — M109 Gout, unspecified: Secondary | ICD-10-CM

## 2019-09-23 DIAGNOSIS — E1122 Type 2 diabetes mellitus with diabetic chronic kidney disease: Secondary | ICD-10-CM

## 2019-09-23 DIAGNOSIS — E559 Vitamin D deficiency, unspecified: Secondary | ICD-10-CM | POA: Diagnosis not present

## 2019-09-23 DIAGNOSIS — E785 Hyperlipidemia, unspecified: Secondary | ICD-10-CM | POA: Diagnosis not present

## 2019-09-23 DIAGNOSIS — Z79899 Other long term (current) drug therapy: Secondary | ICD-10-CM | POA: Diagnosis not present

## 2019-09-23 DIAGNOSIS — I10 Essential (primary) hypertension: Secondary | ICD-10-CM | POA: Diagnosis not present

## 2019-09-23 NOTE — Patient Instructions (Signed)

## 2019-09-23 NOTE — Progress Notes (Signed)
3 MONTH FOLLOW UP  Assessment and Plan:  Type 2 diabetes mellitus with stage 2 chronic kidney disease, without long-term current use of insulin (HCC) -     Hemoglobin A1c Discussed general issues about diabetes pathophysiology and management., Educational material distributed., Suggested low cholesterol diet., Encouraged aerobic exercise., Discussed foot care., Reminded to get yearly retinal exam.  CKD stage 2 due to type 2 diabetes mellitus (HCC) Increase fluids, avoid NSAIDS, monitor sugars, will monitor -     CMP/GFR  Morbid obesity ([place]) - follow up 3 months for progress monitoring - increase veggies, decrease carbs - long discussion about weight loss, diet, and exercise  Hyperlipidemia associated with type 2 diabetes mellitus ([place]) Due to diabetes recently well controlled in prediabetic range, not aggressively pursuing LDL <70 due to patient preference; she is doing well keeping <100 with lifestyle Continue low cholesterol diet and exercise.  Check lipid panel.  -     Lipid panel  Mixed hyperlipidemia check lipids decrease fatty foods increase activity.   Essential hypertension -     CBC with Differential/Platelet -     COMPLETE METABOLIC PANEL WITH GFR -     TSH - continue medications, DASH diet, exercise and monitor at home. Call if greater than 130/80.   Gout Continue allopurinol Diet discussed Check uric acid as needed  Vitamin D deficiency -     VITAMIN D 25 Hydroxy (Vit-D Deficiency, Fractures)  Medication management -     Magnesium    Discussed med's effects and SE's. Screening labs and tests as requested with regular follow-up as recommended. Over 30 minutes of exam, counseling, chart review, and complex, high level critical decision making was performed this visit.   Future Appointments  Date Time Provider [place]  03/09/2020  9:00 AM Liane Comber, NP [facility] None  06/27/2020 10:00 AM Garnet Sierras, NP [facility] None     HPI  69  y.o. AA female, presents for 3 month follow up. She for htn, T2DM with CKD, lipids, morbid obesity, vitamin D def, gout.   She had R hip pain, was seeing Dr. Ninfa Linden and getting lumbar injections without improvement, but recently seemed to resolve spontaneously.   BMI is Body mass index is 38.05 kg/m., she has been working on weight loss. She has been moving more and got a stationary cycle at home. She is watching starches, reducing bread.  Wt Readings from Last 3 Encounters:  09/23/19 191 lb 9.6 oz (86.9 kg)  06/18/19 191 lb (86.6 kg)  03/08/19 187 lb (84.8 kg)   Her blood pressure has been controlled at home, today their BP is BP: 132/84 She does not workout. She denies chest pain, shortness of breath, dizziness.   She is not on cholesterol medication, currently managed by lifestyle changes. Her cholesterol is at goal. The cholesterol last visit was:   Lab Results  Component Value Date   CHOL 161 06/18/2019   HDL 55 06/18/2019   LDLCALC 85 06/18/2019   TRIG 112 06/18/2019   CHOLHDL 2.9 06/18/2019   She has been working on diet and exercise for T2DM, intermittent above 6.5% since 2015 currently managed by lifestyle modification DEE Dr. Truman [facility] 12/2018  she is on bASA  she is not on ACE/ARB per patient preferece denies increased appetite, nausea, paresthesia of the feet, polydipsia, polyuria, visual disturbances and vomiting.  Last A1C in the office was:  Lab Results  Component Value Date   HGBA1C 5.9 (H) 06/18/2019   Last GFR:  Lab Results  Component Value Date   GFRAA 69 06/18/2019   Patient is on Vitamin D supplement but remained below goal at the last check:    Lab Results  Component Value Date   VD25OH 35 03/08/2019     Patient is on allopurinol for gout and does not report a recent flare.  Lab Results  Component Value Date   LABURIC 7.9 (H) 03/08/2019     Current Medications:  Current Outpatient Medications on File Prior to Visit  Medication Sig  Dispense Refill  . allopurinol (ZYLOPRIM) 300 MG tablet TAKE 1 TABLET DAILY TO PREVENT GOUT 90 tablet 3  . amLODipine (NORVASC) 10 MG tablet Take 1 tablet daily for BP 90 tablet 2  . aspirin 81 MG tablet Take 81 mg by mouth daily.    . Cholecalciferol (VITAMIN D3) 25 MCG (1000 UT) CAPS Take 2 cap daily 60 capsule   . diclofenac Sodium (VOLTAREN) 1 % GEL Apply 4 g topically 4 (four) times daily. 100 g 3  . hydrochlorothiazide (HYDRODIURIL) 25 MG tablet Take 1 tablet Daily  for BP & Fluid Retention / Ankle Swelling 90 tablet 3  . Magnesium 250 MG TABS Take by mouth daily.    Marland Kitchen topiramate (TOPAMAX) 50 MG tablet TAKE 1 TABLET BY MOUTH EVERYDAY AT BEDTIME 90 tablet 1   No current facility-administered medications on file prior to visit.   Allergies:  Allergies  Allergen Reactions  . Ziac [Bisoprolol-Hydrochlorothiazide]     Flushing    Medical History:  She has Essential hypertension; Hyperlipidemia associated with type 2 diabetes mellitus ([place]); Vitamin D deficiency; Type 2 diabetes with stage 2 chronic kidney disease GFR 60-89 ([place]); Morbid obesity ([place]); Medication management; CKD stage 2 due to type 2 diabetes mellitus ([place]); Situational anxiety; Lumbar radiculopathy; and Alopecia of scalp on their problem list.   Surgical History:  She has a past surgical history that includes Cesarean section (1971); Knee arthroscopy (Right); Carpal tunnel release (Right); and Tubal ligation. Family History:  Herfamily history includes Aneurysm in her sister; Diabetes in her brother and brother; Heart disease in her brother and brother; Hypertension in her sister; Stroke in her mother; Thyroid disease in her sister. Social History:  She reports that she has never smoked. She has never used smokeless tobacco. She reports current alcohol use. She reports that she does not use drugs.    Review of Systems: Review of Systems  Constitutional: Negative for malaise/fatigue and weight loss.  HENT: Negative  for hearing loss and tinnitus.   Eyes: Negative for blurred vision and double vision.  Respiratory: Negative for cough, shortness of breath and wheezing.   Cardiovascular: Negative for chest pain, palpitations, orthopnea, claudication and leg swelling.  Gastrointestinal: Negative for abdominal pain, blood in stool, constipation, diarrhea, heartburn, melena, nausea and vomiting.  Genitourinary: Negative.   Musculoskeletal: Negative for joint pain and myalgias.  Skin: Negative for rash.  Neurological: Negative for dizziness, tingling, sensory change, weakness and headaches.  Endo/Heme/Allergies: Negative for polydipsia.  Psychiatric/Behavioral: Negative.   All other systems reviewed and are negative.     Physical Exam: Estimated body mass index is 38.05 kg/m as calculated from the following:   Height as of this encounter: 4' 11.5" (1.511 m).   Weight as of this encounter: 191 lb 9.6 oz (86.9 kg). BP 132/84   Pulse 72   Temp (!) 97 F (36.1 C)   Resp 16   Ht 4' 11.5" (1.511 m)   Wt 191 lb 9.6  oz (86.9 kg)   BMI 38.05 kg/m  General Appearance: Well nourished, in no apparent distress.  Eyes: PERRLA, EOMs, conjunctiva no swelling or erythema Sinuses: No Frontal/maxillary tenderness  ENT/Mouth: Ext aud canals clear, normal light reflex with TMs without erythema, bulging. Good dentition. No erythema, swelling, or exudate on post pharynx. Tonsils not swollen or erythematous. Hearing normal.  Neck: Supple, thyroid normal. No bruits  Respiratory: Respiratory effort normal, BS equal bilaterally without rales, rhonchi, wheezing or stridor.  Cardio: RRR without murmurs, rubs or gallops. Brisk peripheral pulses without edema.  Chest: symmetric, with normal excursions and percussion.  Abdomen: Soft, nontender, no guarding, rebound, hernias, masses, or organomegaly.  Lymphatics: Non tender without lymphadenopathy.  Musculoskeletal: Full ROM all peripheral extremities, 5/5 strength, and normal  gait. Skin: hair loss at crown of head and around the front. Warm, dry without rashes, lesions, ecchymosis. Neuro: Cranial nerves intact, reflexes equal bilaterally. Normal muscle tone, no cerebellar symptoms. Sensation intact.  Psych: Awake and oriented X 3, normal affect, Insight and Judgment appropriate.   Gorden Harms Correen Bubolz 5:18 PM [facility]

## 2019-09-24 LAB — COMPLETE METABOLIC PANEL WITH GFR
AG Ratio: 1.5 (calc) (ref 1.0–2.5)
ALT: <3 U/L — ABNORMAL LOW (ref 6–29)
AST: 11 U/L (ref 10–35)
Albumin: 4.3 g/dL (ref 3.6–5.1)
Alkaline phosphatase (APISO): 69 U/L (ref 37–153)
BUN: 19 mg/dL (ref 7–25)
CO2: 31 mmol/L (ref 20–32)
Calcium: 9.9 mg/dL (ref 8.6–10.4)
Chloride: 102 mmol/L (ref 98–110)
Creat: 0.98 mg/dL (ref 0.50–0.99)
GFR, Est African American: 68 mL/min/{1.73_m2} (ref >=60)
GFR, Est Non African American: 59 mL/min/{1.73_m2} — ABNORMAL LOW (ref >=60)
Globulin: 2.8 g/dL (calc) (ref 1.9–3.7)
Glucose, Bld: 100 mg/dL — ABNORMAL HIGH (ref 65–99)
Potassium: 3.8 mmol/L (ref 3.5–5.3)
Sodium: 139 mmol/L (ref 135–146)
Total Bilirubin: 0.4 mg/dL (ref 0.2–1.2)
Total Protein: 7.1 g/dL (ref 6.1–8.1)

## 2019-09-24 LAB — LIPID PANEL
Cholesterol: 168 mg/dL (ref <200)
HDL: 57 mg/dL (ref >=50)
LDL Cholesterol (Calc): 92 mg/dL (calc)
Non-HDL Cholesterol (Calc): 111 mg/dL (calc) (ref <130)
Total CHOL/HDL Ratio: 2.9 (calc) (ref <5.0)
Triglycerides: 92 mg/dL (ref <150)

## 2019-09-24 LAB — CBC WITH DIFFERENTIAL/PLATELET
Absolute Monocytes: 730 cells/uL (ref 200–950)
Basophils Absolute: 66 cells/uL (ref 0–200)
Basophils Relative: 0.8 %
Eosinophils Absolute: 274 cells/uL (ref 15–500)
Eosinophils Relative: 3.3 %
HCT: 41.5 % (ref 35.0–45.0)
Hemoglobin: 13.8 g/dL (ref 11.7–15.5)
Lymphs Abs: 2664 cells/uL (ref 850–3900)
MCH: 28.5 pg (ref 27.0–33.0)
MCHC: 33.3 g/dL (ref 32.0–36.0)
MCV: 85.6 fL (ref 80.0–100.0)
MPV: 11 fL (ref 7.5–12.5)
Monocytes Relative: 8.8 %
Neutro Abs: 4565 cells/uL (ref 1500–7800)
Neutrophils Relative %: 55 %
Platelets: 270 10*3/uL (ref 140–400)
RBC: 4.85 10*6/uL (ref 3.80–5.10)
RDW: 13.6 % (ref 11.0–15.0)
Total Lymphocyte: 32.1 %
WBC: 8.3 10*3/uL (ref 3.8–10.8)

## 2019-09-24 LAB — HEMOGLOBIN A1C
Hgb A1c MFr Bld: 6.2 % of total Hgb — ABNORMAL HIGH (ref <5.7)
Mean Plasma Glucose: 131 (calc)
eAG (mmol/L): 7.3 (calc)

## 2019-09-24 LAB — MAGNESIUM: Magnesium: 1.9 mg/dL (ref 1.5–2.5)

## 2019-09-24 LAB — URIC ACID: Uric Acid, Serum: 3.9 mg/dL (ref 2.5–7.0)

## 2019-09-24 LAB — INSULIN, RANDOM: Insulin: 18.3 u[IU]/mL

## 2019-09-24 LAB — TSH: TSH: 2.02 mIU/L (ref 0.40–4.50)

## 2019-09-24 LAB — VITAMIN D 25 HYDROXY (VIT D DEFICIENCY, FRACTURES): Vit D, 25-Hydroxy: 35 ng/mL (ref 30–100)

## 2019-12-08 NOTE — Progress Notes (Signed)
Assessment and Plan:  Brittney Gray was seen today for encopresis.  Diagnoses and all orders for this visit:  Incontinence of feces, unspecified fecal incontinence type Some reduced sphincter tone and irregular texture without distinct mass to R rectal wall, 3 episodes of fecal incontinence of formed stool No other abnormalities on exam Will refer back to Dr. Collene Mares for further evaluation  Low suspicion of mag supplement contributing, but advised to reduce dose in 1/2 and monitor for recurrent episodes in the interim -     Ambulatory referral to Gastroenterology  Further disposition pending results of labs. Discussed med's effects and SE's.   Over 15 minutes of exam, counseling, chart review, and critical decision making was performed.   Future Appointments  Date Time Provider [place]  12/27/2019 11:30 AM Garnet Sierras, NP [facility] None  04/11/2020  9:00 AM Liane Comber, NP [facility] None  07/19/2020 11:00 AM Garnet Sierras, NP [facility] None    ------------------------------------------------------------------------------------------------------------------   HPI 69 y.o.female with T2DM presents for evaluation of new episodic fecal incontinence.   She reports back in the summer she had 1 episode of fecal incontinence, didn't discuss here due to no recurrent issues, then had another episode a few months ago, then reoccurred 2 weeks ago - she was at [employer], went to the bathroom to urinate and noted she had a spontaneous small stool without realizing.   She denies diarrhea or very soft stool, always formed stool, regular, denies constipation, blood in stool She does have hx of lumbar pain but notes recently this is much improved from previous She has not lost control of bladder, denies saddle anesthesia, decreased sensation.   She has seen Dr. Collene Mares for colonoscopies, last in 09/2014, had polyps removed, but benign and patient reports was advised 10 year follow up.    Past Medical History:  Diagnosis Date  . Hypertension   . Obesity   . Pre-diabetes   . Vitamin D deficiency      Allergies  Allergen Reactions  . Ziac [Bisoprolol-Hydrochlorothiazide]     Flushing     Current Outpatient Medications on File Prior to Visit  Medication Sig  . allopurinol (ZYLOPRIM) 300 MG tablet TAKE 1 TABLET DAILY TO PREVENT GOUT  . amLODipine (NORVASC) 10 MG tablet Take 1 tablet daily for BP  . aspirin 81 MG tablet Take 81 mg by mouth daily.  . Cholecalciferol (VITAMIN D3) 25 MCG (1000 UT) CAPS Take 2 cap daily  . diclofenac Sodium (VOLTAREN) 1 % GEL Apply 4 g topically 4 (four) times daily.  . hydrochlorothiazide (HYDRODIURIL) 25 MG tablet Take 1 tablet Daily  for BP & Fluid Retention / Ankle Swelling  . Magnesium 250 MG TABS Take by mouth daily.  Marland Kitchen topiramate (TOPAMAX) 50 MG tablet TAKE 1 TABLET BY MOUTH EVERYDAY AT BEDTIME (Patient not taking: Reported on 12/09/2019)   No current facility-administered medications on file prior to visit.    ROS: all negative except above.   Physical Exam:  BP 118/82   Pulse 81   Temp (!) 96.1 F (35.6 C)   Wt 191 lb 3.2 oz (86.7 kg)   SpO2 99%   BMI 37.97 kg/m   General Appearance: Well nourished, well dressed AA female, in no apparent distress. Eyes: PERRLA, conjunctiva no swelling or erythema ENT/Mouth: Mask in place; Hearing normal.  Neck: Supple Respiratory: Respiratory effort normal, BS equal bilaterally without rales, rhonchi, wheezing or stridor.  Cardio: RRR with no MRGs. Brisk peripheral pulses without edema.  Abdomen: Soft,  obese + BS.  Non tender, no guarding, rebound, hernias, masses. Lymphatics: Non tender without lymphadenopathy.  Musculoskeletal: Full ROM, 5/5 strength, normal gait. Neg straight leg raise.  Skin: Warm, dry without rashes, lesions, ecchymosis.  Neuro: Normal muscle tone, symmetrical intact patellar and achilles reflexes, distal sensation intact.  Psych: Awake and oriented X 3,  normal affect, Insight and Judgment appropriate.  Rectal exam: no tenderness noted, external hemorrhoids noted, sphincter tone is mildly decreased, irregular texture to R lateral sphincter wall though without distinct mass.   Brittney Ribas, NP 10:27 AM [facility]

## 2019-12-09 ENCOUNTER — Other Ambulatory Visit: Payer: Self-pay

## 2019-12-09 ENCOUNTER — Ambulatory Visit (INDEPENDENT_AMBULATORY_CARE_PROVIDER_SITE_OTHER): Payer: Medicare PPO | Admitting: Adult Health

## 2019-12-09 ENCOUNTER — Encounter: Payer: Self-pay | Admitting: Adult Health

## 2019-12-09 VITALS — BP 118/82 | HR 81 | Temp 96.1°F | Wt 191.2 lb

## 2019-12-09 DIAGNOSIS — R159 Full incontinence of feces: Secondary | ICD-10-CM | POA: Diagnosis not present

## 2019-12-09 NOTE — Patient Instructions (Signed)
Fecal Incontinence Fecal incontinence, also called accidental bowel leakage, is not being able to control your bowels. This condition happens because the nerves or muscles around the anus do not work the way they should. This affects their ability to hold stool (feces). What are the causes? This condition may be caused by:  Damage to the muscles at the end of the rectum (sphincter).  Damage to the nerves that control bowel movements.  Diarrhea.  Chronic constipation.  Pelvic floor dysfunction. This means the muscles in the pelvis do not work well.  Loss of bowel storage capacity. This occurs when the rectum can no longer stretch in size in order to store feces.  Inflammatory bowel disease (IBD), such as Crohn's disease.  Irritable bowel syndrome (IBS). What increases the risk? You are more likely to develop this condition if you:  Were born with bowels or a pelvis that did not form correctly.  Have had rectal surgery.  Have had radiation treatment for certain cancers.  Have been pregnant, had a vaginal delivery, or had surgery that damaged the pelvic floor muscles.  Had a complicated childbirth, spinal cord injury, or other trauma that caused nerve damage.  Have a condition that can affect nerve function, such as diabetes, Parkinson's disease, or multiple sclerosis.  Have a condition where the rectum drops down into the anus or vagina (prolapse).  Are 70 years of age or older. What are the signs or symptoms? The main symptom of this condition is not being able to control your bowels. You also might not be able to get to the bathroom before a bowel movement. How is this diagnosed? This condition is diagnosed with a medical history and physical exam. You may also have other tests, including:  Blood tests.  Urine tests.  A rectal exam.  Ultrasound.  MRI.  Colonoscopy. This is an exam that looks at your large intestine (colon).  Anal manometry. This is a test that  measures the strength of the anal sphincter.  Anal electromyogram (EMG). This is a test that uses small electrodes to check for nerve damage. How is this treated? Treatment for this condition depends on the cause and severity. Treatment may also focus on addressing any underlying causes of this condition. Treatment may include:  Medicines. This may include medicines to: ? Prevent diarrhea. ? Help with constipation (bulk-forming laxatives). ? Treat any underlying conditions.  Biofeedback therapy. This can help to retrain muscles that are affected.  Fiber supplements. These can help manage your bowel movements.  Nerve stimulation.  Injectable gel to promote tissue growth and better muscle control.  Surgery. You may need: ? Sphincter repair surgery. ? Diversion surgery. This procedure lets feces pass out of your body through a hole in your abdomen. Follow these instructions at home: Eating and drinking   Follow instructions from your health care provider about any eating or drinking restrictions. ? Work with a dietitian to come up with a healthy diet that will help you avoid the foods that can make your condition worse. ? Keep a diet diary to find out which foods or drinks could be making your condition worse.  Drink enough fluid to keep your urine pale yellow. Lifestyle  Do not use any products that contain nicotine or tobacco, such as cigarettes and e-cigarettes. If you need help quitting, ask your health care provider. This may help your condition.  If you are overweight, talk with your health care provider about how to safely lose weight. This may help  your condition.  Increase your physical activity as told by your health care provider. This may help your condition. Always talk with your health care provider before starting a new exercise program.  Carry a change of clothes and supplies to clean up quickly if you have an episode of fetal incontinence.  Consider joining a  fecal incontinence support group. You can find a support group online or in your local community. General instructions   Take over-the-counter and prescription medicines only as told by your health care provider. This includes any supplements.  Apply a moisture barrier, such as petroleum jelly, to your rectum. This protects the skin from irritation caused by ongoing leaking or diarrhea.  Tell your health care provider if you are upset or depressed about your condition.  Keep all follow-up visits as told by your health care provider. This is important. Where to find more information  International Foundation for Functional Gastrointestinal Disorders: iffgd.[place] [facility] G[facility]: patients.gi.org Contact a health care provider if:  You have a fever.  You have redness, swelling, or pain around your rectum.  Your pain is getting worse or you lose feeling in your rectal area.  You have blood in your stool.  You feel sad or hopeless.  You avoid social or work situations. Get help right away if:  You stop having bowel movements.  You cannot eat or drink without vomiting.  You have rectal bleeding that does not stop.  You have severe pain that is getting worse.  You have symptoms of dehydration, including: ? Sleepiness or fatigue. ? Producing little or no urine, tears, or sweat. ? Dizziness. ? Dry mouth. ? Unusual irritability. ? Headache. ? Inability to think clearly. Summary  Fecal incontinence, also called accidental bowel leakage, is not being able to control your bowels. This condition happens because the nerves or muscles around the anus do not work the way they should.  Treatment varies depending on the cause and severity of your condition. Treatment may also focus on addressing any underlying causes of this condition.  Follow instructions from your health care provider about any eating or drinking restrictions, lifestyle changes, and skin  care.  Take over-the-counter and prescription medicines only as told by your health care provider. This includes any supplements.  Tell your health care provider if your symptoms worsen or if you are upset or depressed about your condition. This information is not intended to replace advice given to you by your health care provider. Make sure you discuss any questions you have with your health care provider. Document Revised: 05/08/2017 Document Reviewed: 05/08/2017 Elsevier Patient Education  [place].

## 2019-12-25 ENCOUNTER — Ambulatory Visit: Payer: Medicare PPO | Attending: Internal Medicine

## 2019-12-25 DIAGNOSIS — Z23 Encounter for immunization: Secondary | ICD-10-CM

## 2019-12-25 NOTE — Progress Notes (Signed)
   Covid-19 Vaccination Clinic  Name:  Montina Dorrance    MRN: [id number] DOB: 1951-01-04  12/25/2019  Ms. Quattrone was observed post Covid-19 immunization for 15 minutes without incident. She was provided with Vaccine Information Sheet and instruction to access the V-Safe system.   Ms. Chatmon was instructed to call 911 with any severe reactions post vaccine: Marland Kitchen Difficulty breathing  . Swelling of face and throat  . A fast heartbeat  . A bad rash all over body  . Dizziness and weakness   Immunizations Administered    Name Date Dose VIS Date Route   Pfizer COVID-19 Vaccine 12/25/2019  9:37 AM 0.3 mL 10/27/2019 Intramuscular   Manufacturer: [place]   Lot: [id number]   [place]: [id number]

## 2019-12-27 ENCOUNTER — Encounter: Payer: Self-pay | Admitting: Adult Health Nurse Practitioner

## 2019-12-27 ENCOUNTER — Ambulatory Visit (INDEPENDENT_AMBULATORY_CARE_PROVIDER_SITE_OTHER): Payer: Medicare PPO | Admitting: Adult Health Nurse Practitioner

## 2019-12-27 ENCOUNTER — Other Ambulatory Visit: Payer: Self-pay

## 2019-12-27 VITALS — BP 116/84 | HR 103 | Temp 97.5°F | Ht 59.5 in | Wt 187.4 lb

## 2019-12-27 DIAGNOSIS — M109 Gout, unspecified: Secondary | ICD-10-CM | POA: Diagnosis not present

## 2019-12-27 DIAGNOSIS — N182 Chronic kidney disease, stage 2 (mild): Secondary | ICD-10-CM | POA: Diagnosis not present

## 2019-12-27 DIAGNOSIS — E1122 Type 2 diabetes mellitus with diabetic chronic kidney disease: Secondary | ICD-10-CM | POA: Diagnosis not present

## 2019-12-27 DIAGNOSIS — Z79899 Other long term (current) drug therapy: Secondary | ICD-10-CM

## 2019-12-27 DIAGNOSIS — E1169 Type 2 diabetes mellitus with other specified complication: Secondary | ICD-10-CM

## 2019-12-27 DIAGNOSIS — I10 Essential (primary) hypertension: Secondary | ICD-10-CM

## 2019-12-27 DIAGNOSIS — E559 Vitamin D deficiency, unspecified: Secondary | ICD-10-CM | POA: Diagnosis not present

## 2019-12-27 DIAGNOSIS — E785 Hyperlipidemia, unspecified: Secondary | ICD-10-CM | POA: Diagnosis not present

## 2019-12-27 NOTE — Progress Notes (Signed)
FOLLOW UP 3 MONTH  Assessment and Plan:  Type 2 diabetes mellitus with stage 2 chronic kidney disease, without long-term current use of insulin (HCC) -     Hemoglobin A1c Discussed general issues about diabetes pathophysiology and management., Educational material distributed., Suggested low cholesterol diet., Encouraged aerobic exercise., Discussed foot care., Reminded to get yearly retinal exam.  CKD stage 2 due to type 2 diabetes mellitus (HCC) Increase fluids, avoid NSAIDS, monitor sugars, will monitor -     CMP/GFR  Morbid obesity ([place]) - follow up 3 months for progress monitoring - increase veggies, decrease carbs - long discussion about weight loss, diet, and exercise Increase walking Down 4lbs since last OV, encouraged healthy behaviors   Hyperlipidemia associated with type 2 diabetes mellitus ([place]) Due to diabetes recently well controlled in prediabetic range, not aggressively pursuing LDL <70 due to patient preference; she is doing well keeping <100 with lifestyle Continue low cholesterol diet and exercise.  Check lipid panel.  -     Lipid panel  Essential hypertension Continue current medications: amlodapine 10mg , HCTZ 25mg  every morning Monitor blood pressure at home; call if consistently over 130/80 Continue DASH diet.   Reminder to go to the ER if any CP, SOB, nausea, dizziness, severe HA, changes vision/speech, left arm numbness and tingling and jaw pain. -     CBC with Differential/Platelet -     COMPLETE METABOLIC PANEL WITH GFR -     TSH - continue medications, DASH diet, exercise and monitor at home. Call if greater than 130/80.   Gout Continue allopurinol 300mg  Diet discussed Check uric acid as needed  Vitamin D deficiency Continue supplementation to maintain goal of 70-100 Taking Vitamin D 1,000 IU daily Defer vitamin D level -     VITAMIN D 25 Hydroxy (Vit-D Deficiency, Fractures)  Medication management Continued    Discussed med's effects and  SE's. Screening labs and tests as requested with regular follow-up as recommended. Over 30 minutes of face to face interview, exam, counseling, chart review, and complex, high level critical decision making was performed this visit.   Future Appointments  Date Time Provider [place]  04/11/2020  9:00 AM Liane Comber, NP [facility] None  07/19/2020 11:00 AM Geoffrey Mankin, [place] Sewer, NP [facility] None     HPI  69 y.o. AA female, presents for 3 month follow up. She for htn, T2DM with CKD, lipids, morbid obesity, vitamin D def, gout.   She reports no health or medications concerns today.  Overall she is doing well.   She is tachycardic today, reports she was rushing to get here.  She does check her blood pressure at home intermittently.  She had R hip pain, was seeing Dr. Ninfa Linden and getting lumbar injections without improvement, but recently seemed to resolve spontaneously.   BMI is Body mass index is 37.22 kg/m., she has been working on weight loss. She has been moving more and got a stationary cycle at home. She is watching starches, reducing bread.  Wt Readings from Last 3 Encounters:  12/27/19 187 lb 6.4 oz (85 kg)  12/09/19 191 lb 3.2 oz (86.7 kg)  09/23/19 191 lb 9.6 oz (86.9 kg)   Her blood pressure has been controlled at home, today their BP is BP: 116/84 She does not workout. She denies chest pain, shortness of breath, dizziness.   She is not on cholesterol medication, currently managed by lifestyle changes. Her cholesterol is at goal. The cholesterol last visit was:   Lab Results  Component  Value Date   CHOL 168 09/23/2019   HDL 57 09/23/2019   LDLCALC 92 09/23/2019   TRIG 92 09/23/2019   CHOLHDL 2.9 09/23/2019   She has been working on diet and exercise for T2DM, intermittent above 6.5% since 2015 currently managed by lifestyle modification.  She has cut out white bread from her diet. DEE Dr. [facility] 12/2018, due 2021  she is on bASA  she is not on  ACE/ARB per patient preferece denies increased appetite, nausea, paresthesia of the feet, polydipsia, polyuria, visual disturbances and vomiting.  Last A1C in the office was:  Lab Results  Component Value Date   HGBA1C 6.2 (H) 09/23/2019   Last GFR: Lab Results  Component Value Date   GFRAA 68 09/23/2019   Patient is on Vitamin D supplement but remained below goal at the last check:    Lab Results  Component Value Date   VD25OH 35 09/23/2019     Patient is on allopurinol for gout and does not report a recent flare.  Lab Results  Component Value Date   LABURIC 3.9 09/23/2019     Current Medications:  Current Outpatient Medications on File Prior to Visit  Medication Sig Dispense Refill   allopurinol (ZYLOPRIM) 300 MG tablet TAKE 1 TABLET DAILY TO PREVENT GOUT 90 tablet 3   amLODipine (NORVASC) 10 MG tablet Take 1 tablet daily for BP 90 tablet 2   aspirin 81 MG tablet Take 81 mg by mouth daily.     Cholecalciferol (VITAMIN D3) 25 MCG (1000 UT) CAPS Take 2 cap daily 60 capsule    diclofenac Sodium (VOLTAREN) 1 % GEL Apply 4 g topically 4 (four) times daily. 100 g 3   hydrochlorothiazide (HYDRODIURIL) 25 MG tablet Take 1 tablet Daily  for BP & Fluid Retention / Ankle Swelling 90 tablet 3   Magnesium 250 MG TABS Take by mouth daily.     topiramate (TOPAMAX) 50 MG tablet TAKE 1 TABLET BY MOUTH EVERYDAY AT BEDTIME 90 tablet 1   No current facility-administered medications on file prior to visit.   Allergies:  Allergies  Allergen Reactions   Ziac [Bisoprolol-Hydrochlorothiazide]     Flushing    Medical History:  She has Essential hypertension; Hyperlipidemia associated with type 2 diabetes mellitus ([place]); Vitamin D deficiency; Type 2 diabetes with stage 2 chronic kidney disease GFR 60-89 ([place]); Morbid obesity ([place]); Medication management; CKD stage 2 due to type 2 diabetes mellitus ([place]); Lumbar radiculopathy; Alopecia of scalp; and Incontinence of feces on their problem  list.   Surgical History:  She has a past surgical history that includes Cesarean section (1971); Knee arthroscopy (Right); Carpal tunnel release (Right); and Tubal ligation. Family History:  Herfamily history includes Aneurysm in her sister; Diabetes in her brother and brother; Heart disease in her brother and brother; Hypertension in her sister; Stroke in her mother; Thyroid disease in her sister. Social History:  She reports that she has never smoked. She has never used smokeless tobacco. She reports current alcohol use. She reports that she does not use drugs.    Review of Systems: Review of Systems  Constitutional: Negative for malaise/fatigue and weight loss.  HENT: Negative for hearing loss and tinnitus.   Eyes: Negative for blurred vision and double vision.  Respiratory: Negative for cough, shortness of breath and wheezing.   Cardiovascular: Negative for chest pain, palpitations, orthopnea, claudication and leg swelling.  Gastrointestinal: Negative for abdominal pain, blood in stool, constipation, diarrhea, heartburn, melena, nausea and  vomiting.  Genitourinary: Negative.   Musculoskeletal: Negative for joint pain and myalgias.  Skin: Negative for rash.  Neurological: Negative for dizziness, tingling, sensory change, weakness and headaches.  Endo/Heme/Allergies: Negative for polydipsia.  Psychiatric/Behavioral: Negative.   All other systems reviewed and are negative.     Physical Exam: Estimated body mass index is 37.22 kg/m as calculated from the following:   Height as of this encounter: 4' 11.5" (1.511 m).   Weight as of this encounter: 187 lb 6.4 oz (85 kg). BP 116/84    Pulse (!) 103    Temp (!) 97.5 F (36.4 C)    Ht 4' 11.5" (1.511 m)    Wt 187 lb 6.4 oz (85 kg)    SpO2 98%    BMI 37.22 kg/m  General Appearance: Well nourished, in no apparent distress.  Eyes: PERRLA, EOMs, conjunctiva no swelling or erythema Sinuses: No Frontal/maxillary tenderness  ENT/Mouth:  Ext aud canals clear, normal light reflex with TMs without erythema, bulging. Good dentition. No erythema, swelling, or exudate on post pharynx. Tonsils not swollen or erythematous. Hearing normal.  Neck: Supple, thyroid normal. No bruits  Respiratory: Respiratory effort normal, BS equal bilaterally without rales, rhonchi, wheezing or stridor.  Cardio: RRR without murmurs, rubs or gallops. Brisk peripheral pulses without edema.  Chest: symmetric, with normal excursions and percussion.  Abdomen: Soft, nontender, no guarding, rebound, hernias, masses, or organomegaly.  Lymphatics: Non tender without lymphadenopathy.  Musculoskeletal: Full ROM all peripheral extremities, 5/5 strength, and normal gait. Skin: hair loss at crown of head and around the front. Warm, dry without rashes, lesions, ecchymosis. Neuro: Cranial nerves intact, reflexes equal bilaterally. Normal muscle tone, no cerebellar symptoms. Sensation intact.  Psych: Awake and oriented X 3, normal affect, Insight and Judgment appropriate.       Brittney Gray, Laqueta Jean, DNP [facility] 12/27/2019  11:51 AM

## 2019-12-27 NOTE — Patient Instructions (Addendum)
Increase the amount of water you drink everyday from 2 bottles to four.  Increase activity to continue with weight loss.      GENERAL HEALTH GOALS  Know what a healthy weight is for you (roughly BMI <25) and aim to maintain this  Aim for 7+ servings of fruits and vegetables daily  70-80+ fluid ounces of water or unsweet tea for healthy kidneys  Limit to max 1 drink of alcohol per day; avoid smoking/tobacco  Limit animal fats in diet for cholesterol and heart health - choose grass fed whenever available  Avoid highly processed foods, and foods high in saturated/trans fats  Aim for low stress - take time to unwind and care for your mental health  Aim for 150 min of moderate intensity exercise weekly for heart health, and weights twice weekly for bone health  Aim for 7-9 hours of sleep daily

## 2019-12-28 ENCOUNTER — Ambulatory Visit: Payer: Medicare PPO | Admitting: Adult Health Nurse Practitioner

## 2019-12-28 LAB — LIPID PANEL
Cholesterol: 165 mg/dL (ref <200)
HDL: 55 mg/dL (ref >=50)
LDL Cholesterol (Calc): 85 mg/dL (calc)
Non-HDL Cholesterol (Calc): 110 mg/dL (calc) (ref <130)
Total CHOL/HDL Ratio: 3 (calc) (ref <5.0)
Triglycerides: 151 mg/dL — ABNORMAL HIGH (ref <150)

## 2019-12-28 LAB — COMPLETE METABOLIC PANEL WITH GFR
AG Ratio: 1.3 (calc) (ref 1.0–2.5)
ALT: 4 U/L — ABNORMAL LOW (ref 6–29)
AST: 17 U/L (ref 10–35)
Albumin: 4.3 g/dL (ref 3.6–5.1)
Alkaline phosphatase (APISO): 76 U/L (ref 37–153)
BUN/Creatinine Ratio: 17 (calc) (ref 6–22)
BUN: 16 mg/dL (ref 7–25)
CO2: 28 mmol/L (ref 20–32)
Calcium: 10.3 mg/dL (ref 8.6–10.4)
Chloride: 102 mmol/L (ref 98–110)
Creat: 0.96 mg/dL — ABNORMAL HIGH (ref 0.60–0.93)
GFR, Est African American: 69 mL/min/{1.73_m2} (ref >=60)
GFR, Est Non African American: 60 mL/min/{1.73_m2} (ref >=60)
Globulin: 3.2 g/dL (calc) (ref 1.9–3.7)
Glucose, Bld: 84 mg/dL (ref 65–99)
Potassium: 4.1 mmol/L (ref 3.5–5.3)
Sodium: 140 mmol/L (ref 135–146)
Total Bilirubin: 0.5 mg/dL (ref 0.2–1.2)
Total Protein: 7.5 g/dL (ref 6.1–8.1)

## 2019-12-28 LAB — CBC WITH DIFFERENTIAL/PLATELET
Absolute Monocytes: 1020 cells/uL — ABNORMAL HIGH (ref 200–950)
Basophils Absolute: 50 cells/uL (ref 0–200)
Basophils Relative: 0.5 %
Eosinophils Absolute: 760 cells/uL — ABNORMAL HIGH (ref 15–500)
Eosinophils Relative: 7.6 %
HCT: 43.2 % (ref 35.0–45.0)
Hemoglobin: 14.7 g/dL (ref 11.7–15.5)
Lymphs Abs: 2410 cells/uL (ref 850–3900)
MCH: 28.9 pg (ref 27.0–33.0)
MCHC: 34 g/dL (ref 32.0–36.0)
MCV: 85 fL (ref 80.0–100.0)
MPV: 10.6 fL (ref 7.5–12.5)
Monocytes Relative: 10.2 %
Neutro Abs: 5760 cells/uL (ref 1500–7800)
Neutrophils Relative %: 57.6 %
Platelets: 305 10*3/uL (ref 140–400)
RBC: 5.08 10*6/uL (ref 3.80–5.10)
RDW: 14.4 % (ref 11.0–15.0)
Total Lymphocyte: 24.1 %
WBC: 10 10*3/uL (ref 3.8–10.8)

## 2019-12-28 LAB — HEMOGLOBIN A1C
Hgb A1c MFr Bld: 6.4 % of total Hgb — ABNORMAL HIGH (ref <5.7)
Mean Plasma Glucose: 137 mg/dL
eAG (mmol/L): 7.6 mmol/L

## 2020-01-08 DIAGNOSIS — C439 Malignant melanoma of skin, unspecified: Secondary | ICD-10-CM

## 2020-01-08 HISTORY — DX: Malignant melanoma of skin, unspecified: C43.9

## 2020-01-11 ENCOUNTER — Other Ambulatory Visit: Payer: Self-pay | Admitting: Internal Medicine

## 2020-01-11 DIAGNOSIS — R609 Edema, unspecified: Secondary | ICD-10-CM

## 2020-01-11 DIAGNOSIS — I10 Essential (primary) hypertension: Secondary | ICD-10-CM

## 2020-02-24 ENCOUNTER — Encounter: Payer: Medicare Other | Admitting: Physician Assistant

## 2020-03-06 ENCOUNTER — Encounter: Payer: Medicare PPO | Admitting: Physician Assistant

## 2020-03-09 ENCOUNTER — Encounter: Payer: Medicare PPO | Admitting: Adult Health

## 2020-04-11 ENCOUNTER — Encounter: Payer: Medicare PPO | Admitting: Adult Health

## 2020-04-26 DIAGNOSIS — I10 Essential (primary) hypertension: Secondary | ICD-10-CM | POA: Diagnosis not present

## 2020-04-26 DIAGNOSIS — R609 Edema, unspecified: Secondary | ICD-10-CM | POA: Diagnosis not present

## 2020-05-01 ENCOUNTER — Other Ambulatory Visit: Payer: Self-pay | Admitting: Internal Medicine

## 2020-05-01 ENCOUNTER — Encounter: Payer: Self-pay | Admitting: Internal Medicine

## 2020-05-01 ENCOUNTER — Other Ambulatory Visit: Payer: Self-pay

## 2020-05-01 ENCOUNTER — Ambulatory Visit: Payer: Medicare PPO | Admitting: Internal Medicine

## 2020-05-01 VITALS — BP 113/76 | HR 97 | Temp 97.9°F | Resp 16 | Ht 59.5 in | Wt 190.2 lb

## 2020-05-01 DIAGNOSIS — I10 Essential (primary) hypertension: Secondary | ICD-10-CM

## 2020-05-01 DIAGNOSIS — I872 Venous insufficiency (chronic) (peripheral): Secondary | ICD-10-CM

## 2020-05-01 MED ORDER — AMLODIPINE BESYLATE 10 MG PO TABS: ORAL_TABLET | ORAL | 3 refills | Status: DC

## 2020-05-01 MED ORDER — FUROSEMIDE 40 MG PO TABS: ORAL_TABLET | ORAL | 3 refills | Status: DC

## 2020-05-01 NOTE — Patient Instructions (Addendum)
  Stop your Hydrochlorothiazide   Cut Your Norvasc  / Amlodipine in 1/2 tablet    Take your new Fluid pill  - Lasix / Furosemide 2 x /day  Morning & Mid Afternoon  +++++++++++++++++++++++++++++++ Edema  Edema is an abnormal buildup of fluids in the body tissues and under the skin. Swelling of the legs, feet, and ankles is a common symptom that becomes more likely as you get older. Swelling is also common in looser tissues, like around the eyes. When the affected area is squeezed, the fluid may move out of that spot and leave a dent for a few moments. This dent is called pitting edema. There are many possible causes of edema. Eating too much salt (sodium) and being on your feet or sitting for a long time can cause edema in your legs, feet, and ankles. Hot weather may make edema worse. Common causes of edema include:  Heart failure.  Liver or kidney disease.  Weak leg blood vessels.  Cancer.  An injury.  Pregnancy.  Medicines.  Being obese.  Low protein levels in the blood. Edema is usually painless. Your skin may look swollen or shiny. Follow these instructions at home:  Keep the affected body part raised (elevated) above the level of your heart when you are sitting or lying down.  Do not sit still or stand for long periods of time.  Do not wear tight clothing. Do not wear garters on your upper legs.  Exercise your legs to get your circulation going. This helps to move the fluid back into your blood vessels, and it may help the swelling go down.  Wear elastic bandages or support stockings to reduce swelling as told by your health care provider.  Eat a low-salt (low-sodium) diet to reduce fluid as told by your health care provider.  Depending on the cause of your swelling, you may need to limit how much fluid you drink (fluid restriction).  Take over-the-counter and prescription medicines only as told by your health care provider. Contact a health care provider  if:  Your edema does not get better with treatment.  You have heart, liver, or kidney disease and have symptoms of edema.  You have sudden and unexplained weight gain. Get help right away if:  You develop shortness of breath or chest pain.  You cannot breathe when you lie down.  You develop pain, redness, or warmth in the swollen areas.  You have heart, liver, or kidney disease and suddenly get edema.  You have a fever and your symptoms suddenly get worse. Summary  Edema is an abnormal buildup of fluids in the body tissues and under the skin.  Eating too much salt (sodium) and being on your feet or sitting for a long time can cause edema in your legs, feet, and ankles.  Keep the affected body part raised (elevated) above the level of your heart when you are sitting or lying down.

## 2020-05-01 NOTE — Progress Notes (Signed)
   Future Appointments  Date Time Provider [place]  05/18/2020  3:00 PM Liane Comber, NP [facility] None  08/21/2020 11:30 AM Liane Comber, NP [facility] None    History of Present Illness:       Patient is a very nice 70 yo  DBF with hx/o HTN, HLD, T2_DM w/CKD 2 who presents with concerns of increased LE dependent edema beginning over the last 2-3 weeks while she 's been on a family beach trip. She denies any sx's of DOE, orthopnea, PND or suspect CP.   Medications  .  amLODipine  10 MG , Take 1 tablet daily for BP .  hydrochlorothiazide  25 MG , TAKE 1 TABLET DAILY  .  allopurinol  300 MG t, TAKE 1 TABLET DAILY  .  aspirin 81 MG , Take daily. Marland Kitchen  VITAMIN D  1000 UT) CAPS, Take 2 cap daily .  diclofenac  1 % GEL, Apply 4 g topically 4 times daily. .  Magnesium 250 MG TABS, Take  daily.  Problem list She has Essential hypertension; Hyperlipidemia associated with type 2 diabetes mellitus ([place]); Vitamin D deficiency; Type 2 diabetes with stage 2 chronic kidney disease GFR 60-89 ([place]); Morbid obesity ([place]); Medication management; CKD stage 2 due to type 2 diabetes mellitus ([place]); Lumbar radiculopathy; Alopecia of scalp; and Incontinence of feces on their problem list.   Observations/Objective:   Pulse (!) 102   Temp 97.9 F (36.6 C)   Resp 16   Ht 4' 11.5" (1.511 m)   Wt 190 lb 3.2 oz (86.3 kg)   SpO2 99%   BMI 37.77 kg/m   HEENT - WNL. Neck - supple. No JVD/Bruits. Chest - Clear equal BS. Cor - Nl HS. RRR w/o sig MGR. PP 1(+). 1-2 (+) pitting edema to bilateral proximal shins . MS- FROM w/o deformities.  Gait Nl. Neuro -  Nl w/o focal abnormalities.  Assessment and Plan:  1. Essential hypertension   2. Edema of both lower extremities due to peripheral venous insufficiency  - Advised d/c HCTZ  - New Rx - furosemide (LASIX) 40 MG tablet;  Take  1 tablet  2 x /day  for BP & Fluid Retention /Edema    - Advised taper amLODipine (NORVASC) 10 MG to   1/2  tablet  Daily   - Discussed Low Salt diet   - Has f/u OV in about 2 weeks         I discussed the assessment and treatment plan with the patient. The patient was provided an opportunity to ask questions and all were answered. The patient agreed with the plan and demonstrated an understanding of the instructions.       The patient was advised to call back or seek an in-person evaluation if the symptoms worsen or if the condition fails to improve as anticipated.   Kirtland Bouchard, MD

## 2020-05-04 ENCOUNTER — Other Ambulatory Visit: Payer: Self-pay | Admitting: *Deleted

## 2020-05-04 DIAGNOSIS — M109 Gout, unspecified: Secondary | ICD-10-CM

## 2020-05-04 MED ORDER — ALLOPURINOL 300 MG PO TABS: ORAL_TABLET | ORAL | 3 refills | Status: DC

## 2020-05-18 ENCOUNTER — Encounter: Payer: Self-pay | Admitting: Adult Health

## 2020-05-18 ENCOUNTER — Ambulatory Visit (INDEPENDENT_AMBULATORY_CARE_PROVIDER_SITE_OTHER): Payer: Medicare PPO | Admitting: Adult Health

## 2020-05-18 ENCOUNTER — Other Ambulatory Visit: Payer: Self-pay

## 2020-05-18 VITALS — BP 104/76 | HR 97 | Temp 97.7°F | Ht 59.5 in | Wt 186.0 lb

## 2020-05-18 DIAGNOSIS — Z1329 Encounter for screening for other suspected endocrine disorder: Secondary | ICD-10-CM

## 2020-05-18 DIAGNOSIS — N182 Chronic kidney disease, stage 2 (mild): Secondary | ICD-10-CM | POA: Diagnosis not present

## 2020-05-18 DIAGNOSIS — E1169 Type 2 diabetes mellitus with other specified complication: Secondary | ICD-10-CM

## 2020-05-18 DIAGNOSIS — L659 Nonscarring hair loss, unspecified: Secondary | ICD-10-CM

## 2020-05-18 DIAGNOSIS — Z136 Encounter for screening for cardiovascular disorders: Secondary | ICD-10-CM | POA: Diagnosis not present

## 2020-05-18 DIAGNOSIS — Z23 Encounter for immunization: Secondary | ICD-10-CM

## 2020-05-18 DIAGNOSIS — C4372 Malignant melanoma of left lower limb, including hip: Secondary | ICD-10-CM | POA: Insufficient documentation

## 2020-05-18 DIAGNOSIS — M109 Gout, unspecified: Secondary | ICD-10-CM | POA: Insufficient documentation

## 2020-05-18 DIAGNOSIS — Z1389 Encounter for screening for other disorder: Secondary | ICD-10-CM

## 2020-05-18 DIAGNOSIS — E1122 Type 2 diabetes mellitus with diabetic chronic kidney disease: Secondary | ICD-10-CM | POA: Diagnosis not present

## 2020-05-18 DIAGNOSIS — Z79899 Other long term (current) drug therapy: Secondary | ICD-10-CM

## 2020-05-18 DIAGNOSIS — I10 Essential (primary) hypertension: Secondary | ICD-10-CM

## 2020-05-18 DIAGNOSIS — E785 Hyperlipidemia, unspecified: Secondary | ICD-10-CM | POA: Diagnosis not present

## 2020-05-18 DIAGNOSIS — M1A9XX0 Chronic gout, unspecified, without tophus (tophi): Secondary | ICD-10-CM

## 2020-05-18 DIAGNOSIS — Z86010 Personal history of colonic polyps: Secondary | ICD-10-CM

## 2020-05-18 DIAGNOSIS — L819 Disorder of pigmentation, unspecified: Secondary | ICD-10-CM

## 2020-05-18 DIAGNOSIS — Z0000 Encounter for general adult medical examination without abnormal findings: Secondary | ICD-10-CM

## 2020-05-18 DIAGNOSIS — E559 Vitamin D deficiency, unspecified: Secondary | ICD-10-CM | POA: Diagnosis not present

## 2020-05-18 MED ORDER — AMLODIPINE BESYLATE 5 MG PO TABS: 5.0000 mg | ORAL_TABLET | Freq: Every day | ORAL | 3 refills | Status: DC

## 2020-05-18 NOTE — Progress Notes (Signed)
CPE  Assessment and Plan:   Encounter Routine Physical Exam  1 year Td boosted -   Type 2 diabetes mellitus with stage 2 chronic kidney disease, without long-term current use of insulin (HCC) -     Hemoglobin A1c Discussed general issues about diabetes pathophysiology and management., Educational material distributed., Suggested low cholesterol diet., Encouraged aerobic exercise., Discussed foot care., Reminded to get yearly retinal exam.  CKD stage 2 due to type 2 diabetes mellitus ([place]) Discussed general issues about diabetes pathophysiology and management., Educational material distributed., Suggested low cholesterol diet., Encouraged aerobic exercise., Discussed foot care., Reminded to get yearly retinal exam. -     Hemoglobin A1c  Hyperlipidemia associated with type 2 diabetes mellitus ([place]) Currently treated by lifestyle LDL goal <70, consider low dose statin if persists above goal  decrease fatty foods increase activity.  -     Lipid panel  Morbid obesity ([place]) - follow up 3 months for progress monitoring - increase veggies, decrease carbs - long discussion about weight loss, diet, and exercise  Essential hypertension - continue medications, DASH diet, exercise and monitor at home. Call if greater than 130/80.  -     CBC with Differential/Platelet -     COMPLETE METABOLIC PANEL WITH GFR -     TSH  Gout involving toe, unspecified cause, unspecified laterality Continue allopurinol -     Uric acid  Vitamin D deficiency -     VITAMIN D 25 Hydroxy (Vit-D Deficiency, Fractures)  Medication management -     Magnesium  Lumbar disc disease Follow up ortho PRN Weight loss advised  Alopecia of scalp Improved, follow up derm PRN  Change in pigmented lesion of left foot Uncertain etiology however concerning for urgent need to r/o melanoma; referral placed for derm/skin surgery for biopsy    Discussed med's effects and SE's. Screening labs and tests as requested with  regular follow-up as recommended. Over 30 minutes of exam, counseling, chart review, and complex, high level critical decision making was performed this visit.   Future Appointments  Date Time Provider [place]  08/21/2020 11:30 AM Liane Comber, NP [facility] None  05/21/2021  3:00 PM Liane Comber, NP [facility] None     HPI  70 y.o. AA female, presents for a cpe and follow up. She has Essential hypertension; Hyperlipidemia associated with type 2 diabetes mellitus ([place]); Vitamin D deficiency; Type 2 diabetes with stage 2 chronic kidney disease GFR 60-89 ([place]); Morbid obesity ([place]); Medication management; CKD stage 2 due to type 2 diabetes mellitus ([place]); Lumbar radiculopathy; Alopecia of scalp; and Gout on their problem list.   divorced/widowed, 86 son, 2 year old granddaughter, very active in church, works Therapist, nutritional.   She had intermittent R hip pain, follows Dr. Ninfa Linden, had MRI back with L4-L5 disc bulge on the right to L5- has had several shots, reports has since resolved, no pain in several months.   Recently noted brown/melanocytic area to left foot, believes present for several years, noted is larger than previous in the last 3 weeks.   BMI is Body mass index is 36.94 kg/m., she has been working on weight loss. She has been moving more and got a bicycle at home. Wt Readings from Last 3 Encounters:  05/18/20 186 lb (84.4 kg)  05/01/20 190 lb 3.2 oz (86.3 kg)  12/27/19 187 lb 6.4 oz (85 kg)   Her blood pressure has been controlled at home, today their BP is BP: 104/76 She does not workout. She  denies chest pain, shortness of breath, dizziness.  Takes amlodipine 5 mg, recently switched from HCTZ to lasix due to edema, improved.   She is not on cholesterol medication, currently managed by lifestyle changes. Her cholesterol is at goal. The cholesterol last visit was:   Lab Results  Component Value Date   CHOL 165 12/27/2019   HDL 55 12/27/2019   LDLCALC 85  12/27/2019   TRIG 151 (H) 12/27/2019   CHOLHDL 3.0 12/27/2019   She has been working on diet and exercise for T2DM  currently managed by lifestyle modification DEE Dr. [facility] ? 2021  she is on bASA  she is not on ACE/ARB denies increased appetite, nausea, paresthesia of the feet, polydipsia, polyuria, visual disturbances and vomiting.  Last A1C in the office was:  Lab Results  Component Value Date   HGBA1C 6.4 (H) 12/27/2019   Last GFR: Lab Results  Component Value Date   GFRAA 69 12/27/2019   Patient is on Vitamin D supplement, taking 2000 IU daily  Lab Results  Component Value Date   VD25OH 35 09/23/2019    Patient is on allopurinol for gout and does not report a recent flare.  Lab Results  Component Value Date   LABURIC 3.9 09/23/2019     Current Medications:  Current Outpatient Medications on File Prior to Visit  Medication Sig Dispense Refill  . allopurinol (ZYLOPRIM) 300 MG tablet TAKE 1 TABLET DAILY TO PREVENT GOUT 90 tablet 3  . amLODipine (NORVASC) 10 MG tablet TAKE 1 TABLET DAILY FOR BLOOD PRESSURE 90 tablet 2  . aspirin 81 MG tablet Take 81 mg by mouth daily.    . Cholecalciferol (VITAMIN D3) 25 MCG (1000 UT) CAPS Take 2 cap daily 60 capsule   . diclofenac Sodium (VOLTAREN) 1 % GEL Apply 4 g topically 4 (four) times daily. 100 g 3  . furosemide (LASIX) 40 MG tablet Take  1 tablet  2 x /day  for BP & Fluid Retention / Edema 180 tablet 3  . Magnesium 250 MG TABS Take by mouth daily.     No current facility-administered medications on file prior to visit.   Allergies:  Allergies  Allergen Reactions  . Ziac [Bisoprolol-Hydrochlorothiazide]     Flushing    Medical History:  She has Essential hypertension; Hyperlipidemia associated with type 2 diabetes mellitus ([place]); Vitamin D deficiency; Type 2 diabetes with stage 2 chronic kidney disease GFR 60-89 ([place]); Morbid obesity ([place]); Medication management; CKD stage 2 due to type 2 diabetes mellitus  ([place]); Lumbar radiculopathy; Alopecia of scalp; and Gout on their problem list.   Health Maintenance:   Immunization History  Administered Date(s) Administered  . Influenza, High Dose Seasonal PF 05/04/2018  . PFIZER(Purple Top)SARS-COV-2 Vaccination 01/31/2019, 02/27/2019, 12/25/2019  . PPD Test 04/26/2013   Preventative care: Last colonoscopy: 09/2014, benign polyps, 10 year follow up, Dr. Collene Mares EGD: 09/2014 Dr. Collene Mares Last mammogram: 08/2019 Last pap smear/pelvic exam: 2016 declines another DEXA: 2013  Prior vaccinations: TD or Tdap: 2009, TODAY   Influenza: 2020, declines Pneumococcal: declines Prevnar13: declines Shingles/Zostavax: declines COVID 02/2019 UTD  Names of Other Physician/Practitioners you currently use: 1. [facility] here for primary care 2. Dr. Truman Hayward, eye doctor, last visit ? 2021 3. Dr. Sallee Lange, dentist, last visit 2022, q55m, has scheduled in July  Patient Care Team: Unk Pinto, MD as PCP - General (Internal Medicine) Juanita Craver, MD as Consulting Physician (Gastroenterology)  Surgical History:  She has  a past surgical history that includes Cesarean section (1971); Knee arthroscopy (Right); Carpal tunnel release (Right); and Tubal ligation. Family History:  Herfamily history includes Aneurysm in her sister; COPD in her brother; Diabetes in her brother and brother; Heart disease in her brother and brother; Hypertension in her sister; Stroke in her mother; Thyroid disease in her sister. Social History:  She reports that she has never smoked. She has never used smokeless tobacco. She reports current alcohol use. She reports that she does not use drugs.  Review of Systems: Review of Systems  Constitutional: Negative for malaise/fatigue and weight loss.  HENT: Negative for hearing loss and tinnitus.   Eyes: Negative for blurred vision and double vision.  Respiratory: Negative for cough, shortness of breath and  wheezing.   Cardiovascular: Negative for chest pain, palpitations, orthopnea, claudication and leg swelling (bil feet, improved).  Gastrointestinal: Negative for abdominal pain, blood in stool, constipation, diarrhea, heartburn, melena, nausea and vomiting.  Genitourinary: Negative.   Musculoskeletal: Negative for joint pain and myalgias.  Skin: Negative for rash.  Neurological: Negative for dizziness, tingling, sensory change, weakness and headaches.  Endo/Heme/Allergies: Negative for polydipsia.  Psychiatric/Behavioral: Negative.  Negative for depression. The patient is not nervous/anxious and does not have insomnia.   All other systems reviewed and are negative.   Physical Exam: Estimated body mass index is 36.94 kg/m as calculated from the following:   Height as of this encounter: 4' 11.5" (1.511 m).   Weight as of this encounter: 186 lb (84.4 kg). BP 104/76   Pulse 97   Temp 97.7 F (36.5 C)   Ht 4' 11.5" (1.511 m)   Wt 186 lb (84.4 kg)   SpO2 98%   BMI 36.94 kg/m  General Appearance: Well nourished, in no apparent distress.  Eyes: PERRLA, EOMs, conjunctiva no swelling or erythema, normal fundi and vessels.  Sinuses: No Frontal/maxillary tenderness  ENT/Mouth: Ext aud canals clear, normal light reflex with TMs without erythema, bulging. Good dentition. No erythema, swelling, or exudate on post pharynx. Tonsils not swollen or erythematous. Hearing normal.  Neck: Supple, thyroid normal. No bruits  Respiratory: Respiratory effort normal, BS equal bilaterally without rales, rhonchi, wheezing or stridor.  Cardio: RRR without murmurs, rubs or gallops. Brisk peripheral pulses without edema.  Chest: symmetric, with normal excursions and percussion.  Breasts: Symmetric, without lumps, nipple discharge, retractions.  Abdomen: Soft, nontender, no guarding, rebound, hernias, masses, or organomegaly.  Lymphatics: Non tender without lymphadenopathy.  Musculoskeletal: Full ROM all  peripheral extremities, 5/5 strength, and normal gait. Skin: Warm, dry without rashes, ecchymosis. Left lateral heel with irregular melanocytic area - see photo Neuro: Cranial nerves intact, reflexes equal bilaterally. Normal muscle tone, no cerebellar symptoms. Sensation intact.  Psych: Awake and oriented X 3, normal affect, Insight and Judgment appropriate.  GU: defer  EKG: NSR, NSCPT       Gorden Harms Dolora Ridgely 3:37 PM [facility]

## 2020-05-18 NOTE — Patient Instructions (Addendum)
Brittney Gray , Thank you for taking time to come for your Annual Wellness Visit. I appreciate your ongoing commitment to your health goals. Please review the following plan we discussed and let me know if I can assist you in the future.   These are the goals we discussed: Goals    . DIET - INCREASE WATER INTAKE    . Exercise 150 min/wk Moderate Activity    . Weight (lb) < 190 lb (86.2 kg)       This is a list of the screening recommended for you and due dates:  Health Maintenance  Topic Date Due  . Eye exam for diabetics  08/28/2017  . Urine Protein Check  03/07/2020  . Tetanus Vaccine  06/17/2020*  . Pneumonia vaccines (1 of 2 - PCV13) 06/17/2020*  . Hemoglobin A1C  06/26/2020  . Complete foot exam   05/18/2021  . Mammogram  08/11/2021  . Colon Cancer Screening  09/18/2024  . DEXA scan (bone density measurement)  Completed  . COVID-19 Vaccine  Completed  . Hepatitis C Screening: USPSTF Recommendation to screen - Ages 11-79 yo.  Completed  . HPV Vaccine  Aged Out  . Flu Shot  Discontinued  *Topic was postponed. The date shown is not the original due date.    There is concern for possible melanoma of your foot - if the dark area on your foot is CHANGING, getting bigger, would strongly recommend getting checked out by dermatologist asap   Melanoma Melanoma is a form of skin cancer that begins in the skin cells that produce pigment (melanocytes). Melanoma usually starts as a mole or growth (lesion) on the skin and can spread to other areas of the body. If found early and treated, many cases of melanoma are curable. What are the causes? The exact cause of this condition is unknown. What increases the risk? The following factors may make you more likely to develop this condition:  Spending a lot of time in the sun, under a sunlamp, or in a tanning booth.  Having sunburn that blisters. The more blistering sunburns you have had, the higher your risk for melanoma  becomes.  Spending time in parts of the world where sunlight is intense.  Living in a hot, sunny climate.  Having fair skin that does not tan easily.  Having had melanoma before.  Having a family history of melanoma.  Having many skin moles or unusual moles (dysplastic nevi).  Having a weakened immune system. What are the signs or symptoms? Symptoms of this condition include:  ABCDE changes in a mole. ABCDE stands for: ? Asymmetry. This means the mole has an irregular shape. It is not round or oval. ? Border. This means the mole has an irregular or bumpy border. ? Color. This means the mole has multiple colors in it, including brown, black, blue, red, or tan. ? Diameter. This means the mole is more than 0.2 in (6 mm) across. ? Evolving. This refers to any unusual changes or symptoms in the mole, such as pain, itching, stinging, sensitivity, or bleeding.  A new mole or skin lesion. Symptoms of melanoma that may have spread to other areas of the body include:  Swollen lymph nodes.  Shortness of breath.  Bone pain.  Headache.  Seizures.  Visual problems.   How is this diagnosed? This condition is diagnosed by examining a tissue sample from the mole (biopsy). The biopsy will reveal whether melanoma has spread to deeper layers of the skin. You  may also have other tests, including:  Blood tests.  Chest X-rays.  Sentinel node biopsy.  A CT scan.  A bone scan. How is this treated? This condition is treated with surgery to remove the cancer as well as some of the tissue around where the cancer was found. Your lymph nodes may also be removed during the surgery. If melanoma has spread to other areas, such as the lymph nodes, liver, lungs, bone, or brain, you will need additional treatment, which may include:  Chemotherapy. This treatment uses medicine to kill cancer cells.  Radiation therapy. This treatment uses high-energy rays to kill cancer cells.  Targeted  therapy. This treatment uses medicines that target specific genetic mutations linked to melanoma.  Biological therapy (immunotherapy). This treatment acts on the immune system to help kill cancer cells. A combination of surgery and other treatments may be recommended. Melanoma can come back after treatment (recur). Follow these instructions at home: Medicines  Take over-the-counter and prescription medicines only as told by your health care provider.  Talk with your health care provider before taking vitamins, supplements, and over-the-counter medicines. Some of these can interfere with chemotherapy. Eating and drinking  Drink enough fluid to keep your urine pale yellow.  Talk to a dietitian about what you should eat and drink during cancer treatment.  If you have side effects that affect eating, it may help to: ? Eat smaller meals and snacks often. ? Drink high-nutrition and high-calorie shakes or supplements. ? Eat bland and soft foods that are easy to eat. ? Do not eat foods that are hot, spicy, or difficult to swallow. Lifestyle  Stay out of the sun, especially during peak mid-afternoon hours. Take the following precautions every day, even on cloudy days and in the winter, and even if you will be outdoors for only a short period of time. ? Wear long sleeves, a hat, and sunglasses that block UV light when possible. ? Regularly apply a sunscreen with an SPF of 30 or higher. ? Consider cosmetics that contain an SPF. ? Limit sun exposure in the middle part of the day.  Get regular exercise, such as walking, gentle yoga, or tai chi.  Do not use any products that contain nicotine or tobacco, such as cigarettes and e-cigarettes. If you need help quitting, ask your health care provider.  Consider joining a support group. Ask your health care provider to recommend support groups online or in your community.   General instructions  Learn as much as you can about your condition.  If  you lose your hair, wear a wig, hat, or scarf to cover your head.  Meet with a hair and skin care specialist for makeup and skin care tips.  Work with your health care provider to manage side effects of treatment.  Keep all follow-up visits as told by your health care provider. This is important. You will need to have regular visits during and after treatment as a part of your care. Where to find more information  [place]: https://www.cancer.gov  American Cancer Society: http://www.cancer.org Contact a health care provider if:  You notice any new growths or changes in your skin.  You have had melanoma removed and you notice a new growth in the same location.  You have any new pain or new symptoms. Get help right away if:  You have a seizure.  You have chest pain.  You have trouble breathing.  You have a fever.  You have bleeding that does not  stop. Summary  Melanoma is a form of skin cancer that begins in the skin cells that produce pigment (melanocytes). Melanoma usually starts as a mole or growth (lesion) on the skin and can spread to other areas of the body.  This condition is diagnosed by examining a tissue sample from a mole (biopsy).  If found early and treated, many cases of melanoma are curable.  A combination of surgery and other treatments may be recommended.  Keep all follow-up visits as told by your health care provider. This is important. You will need to have regular visits during and after treatment as a part of your care. This information is not intended to replace advice given to you by your health care provider. Make sure you discuss any questions you have with your health care provider. Document Revised: 01/06/2017 Document Reviewed: 01/06/2017 Elsevier Patient Education  2021 [name].

## 2020-05-19 ENCOUNTER — Other Ambulatory Visit: Payer: Self-pay | Admitting: Adult Health

## 2020-05-19 LAB — CBC WITH DIFFERENTIAL/PLATELET
Absolute Monocytes: 791 cells/uL (ref 200–950)
Basophils Absolute: 51 cells/uL (ref 0–200)
Basophils Relative: 0.6 %
Eosinophils Absolute: 383 cells/uL (ref 15–500)
Eosinophils Relative: 4.5 %
HCT: 40.6 % (ref 35.0–45.0)
Hemoglobin: 13.6 g/dL (ref 11.7–15.5)
Lymphs Abs: 2550 cells/uL (ref 850–3900)
MCH: 29.4 pg (ref 27.0–33.0)
MCHC: 33.5 g/dL (ref 32.0–36.0)
MCV: 87.7 fL (ref 80.0–100.0)
MPV: 10.8 fL (ref 7.5–12.5)
Monocytes Relative: 9.3 %
Neutro Abs: 4726 cells/uL (ref 1500–7800)
Neutrophils Relative %: 55.6 %
Platelets: 299 10*3/uL (ref 140–400)
RBC: 4.63 10*6/uL (ref 3.80–5.10)
RDW: 13.8 % (ref 11.0–15.0)
Total Lymphocyte: 30 %
WBC: 8.5 10*3/uL (ref 3.8–10.8)

## 2020-05-19 LAB — LIPID PANEL
Cholesterol: 163 mg/dL (ref <200)
HDL: 48 mg/dL — ABNORMAL LOW (ref >=50)
LDL Cholesterol (Calc): 92 mg/dL (calc)
Non-HDL Cholesterol (Calc): 115 mg/dL (calc) (ref <130)
Total CHOL/HDL Ratio: 3.4 (calc) (ref <5.0)
Triglycerides: 134 mg/dL (ref <150)

## 2020-05-19 LAB — HEMOGLOBIN A1C
Hgb A1c MFr Bld: 6.1 % of total Hgb — ABNORMAL HIGH (ref <5.7)
Mean Plasma Glucose: 128 mg/dL
eAG (mmol/L): 7.1 mmol/L

## 2020-05-19 LAB — COMPLETE METABOLIC PANEL WITH GFR
AG Ratio: 1.5 (calc) (ref 1.0–2.5)
ALT: 3 U/L — ABNORMAL LOW (ref 6–29)
AST: 15 U/L (ref 10–35)
Albumin: 4.3 g/dL (ref 3.6–5.1)
Alkaline phosphatase (APISO): 81 U/L (ref 37–153)
BUN: 16 mg/dL (ref 7–25)
CO2: 31 mmol/L (ref 20–32)
Calcium: 9.8 mg/dL (ref 8.6–10.4)
Chloride: 102 mmol/L (ref 98–110)
Creat: 0.85 mg/dL (ref 0.60–0.93)
GFR, Est African American: 80 mL/min/{1.73_m2} (ref >=60)
GFR, Est Non African American: 69 mL/min/{1.73_m2} (ref >=60)
Globulin: 2.8 g/dL (calc) (ref 1.9–3.7)
Glucose, Bld: 99 mg/dL (ref 65–99)
Potassium: 3.9 mmol/L (ref 3.5–5.3)
Sodium: 139 mmol/L (ref 135–146)
Total Bilirubin: 0.3 mg/dL (ref 0.2–1.2)
Total Protein: 7.1 g/dL (ref 6.1–8.1)

## 2020-05-19 LAB — URINALYSIS, ROUTINE W REFLEX MICROSCOPIC
Bilirubin Urine: NEGATIVE
Glucose, UA: NEGATIVE
Hgb urine dipstick: NEGATIVE
Hyaline Cast: NONE SEEN /LPF
Ketones, ur: NEGATIVE
Nitrite: NEGATIVE
Protein, ur: NEGATIVE
RBC / HPF: NONE SEEN /HPF (ref 0–2)
Specific Gravity, Urine: 1.021 (ref 1.001–1.035)
pH: 6 (ref 5.0–8.0)

## 2020-05-19 LAB — MAGNESIUM: Magnesium: 1.9 mg/dL (ref 1.5–2.5)

## 2020-05-19 LAB — URIC ACID: Uric Acid, Serum: 4.2 mg/dL (ref 2.5–7.0)

## 2020-05-19 LAB — MICROSCOPIC MESSAGE

## 2020-05-19 LAB — MICROALBUMIN / CREATININE URINE RATIO
Creatinine, Urine: 168 mg/dL (ref 20–275)
Microalb Creat Ratio: 1 mcg/mg creat (ref <30)
Microalb, Ur: 0.2 mg/dL

## 2020-05-19 LAB — VITAMIN D 25 HYDROXY (VIT D DEFICIENCY, FRACTURES): Vit D, 25-Hydroxy: 37 ng/mL (ref 30–100)

## 2020-05-19 LAB — TSH: TSH: 2.06 mIU/L (ref 0.40–4.50)

## 2020-05-19 MED ORDER — VITAMIN D3 25 MCG (1000 UT) PO CAPS: ORAL_CAPSULE | ORAL | Status: AC

## 2020-05-25 DIAGNOSIS — D0372 Melanoma in situ of left lower limb, including hip: Secondary | ICD-10-CM | POA: Diagnosis not present

## 2020-05-25 DIAGNOSIS — L659 Nonscarring hair loss, unspecified: Secondary | ICD-10-CM | POA: Diagnosis not present

## 2020-06-23 DIAGNOSIS — C4372 Malignant melanoma of left lower limb, including hip: Secondary | ICD-10-CM | POA: Diagnosis not present

## 2020-06-23 DIAGNOSIS — C4370 Malignant melanoma of unspecified lower limb, including hip: Secondary | ICD-10-CM | POA: Diagnosis not present

## 2020-06-27 ENCOUNTER — Ambulatory Visit: Payer: Medicare PPO | Admitting: Adult Health Nurse Practitioner

## 2020-07-07 DIAGNOSIS — D0372 Melanoma in situ of left lower limb, including hip: Secondary | ICD-10-CM | POA: Diagnosis not present

## 2020-07-07 HISTORY — PX: SKIN SURGERY: SHX2413

## 2020-07-19 ENCOUNTER — Ambulatory Visit: Payer: Medicare PPO | Admitting: Adult Health Nurse Practitioner

## 2020-07-27 ENCOUNTER — Emergency Department (HOSPITAL_BASED_OUTPATIENT_CLINIC_OR_DEPARTMENT_OTHER)
Admission: EM | Admit: 2020-07-27 | Discharge: 2020-07-27 | Disposition: A | Discharge status: Home / Self Care | Payer: Medicare PPO | Attending: Emergency Medicine | Admitting: Emergency Medicine

## 2020-07-27 ENCOUNTER — Encounter (HOSPITAL_BASED_OUTPATIENT_CLINIC_OR_DEPARTMENT_OTHER): Payer: Self-pay | Admitting: *Deleted

## 2020-07-27 ENCOUNTER — Other Ambulatory Visit: Payer: Self-pay

## 2020-07-27 DIAGNOSIS — Z79899 Other long term (current) drug therapy: Secondary | ICD-10-CM | POA: Diagnosis not present

## 2020-07-27 DIAGNOSIS — M79651 Pain in right thigh: Secondary | ICD-10-CM | POA: Insufficient documentation

## 2020-07-27 DIAGNOSIS — E1122 Type 2 diabetes mellitus with diabetic chronic kidney disease: Secondary | ICD-10-CM | POA: Diagnosis not present

## 2020-07-27 DIAGNOSIS — N182 Chronic kidney disease, stage 2 (mild): Secondary | ICD-10-CM | POA: Insufficient documentation

## 2020-07-27 DIAGNOSIS — I129 Hypertensive chronic kidney disease with stage 1 through stage 4 chronic kidney disease, or unspecified chronic kidney disease: Secondary | ICD-10-CM | POA: Insufficient documentation

## 2020-07-27 MED ORDER — RIVAROXABAN 15 MG PO TABS
15.0000 mg | ORAL_TABLET | Freq: Once | ORAL | Status: AC
  Administered 2020-07-27: 15 mg via ORAL
  Filled 2020-07-27: qty 1

## 2020-07-27 NOTE — ED Triage Notes (Signed)
Bilateral inner thigh pain since Tuesday.  She stated that she felt like a knot to left inner thigh.  Denies any injuries.

## 2020-07-27 NOTE — Discharge Instructions (Addendum)
If you develop new or worsening thigh pain, thigh swelling, or if you develop chest pain, shortness of breath, or any other new/concerning symptoms then return to the ER for evaluation.  IMPORTANT PATIENT INSTRUCTIONS:  You have been scheduled for an Outpatient Ultrasound.    Your appointment has been scheduled for:  _______ am/pm on _______________ (date).  If your appointment is scheduled for a Saturday, Sunday or holiday, please go to the [facility] Emergency Department Registration Desk at least 15 minutes prior to your appointment time and tell them you are there for an ultrasound.    If your appointment is scheduled for a weekday (Monday - Friday), please go directly to the [facility] Radiology Department reception area at least 15 minutes prior to your appointment time and tell them you are there for an ultrasound.  Please call [phone] with questions.

## 2020-07-27 NOTE — ED Provider Notes (Signed)
[facility] Provider Note   CSN: [id number] Arrival date & time: 07/27/20  1020     History Chief Complaint  Patient presents with   Leg Pain    Br[facility] Gray is a 70 y.o. female.  HPI 70 year old female presents with medial right thigh cramp.  Started 2 days ago.  Feels like a cramp and is moderate.  Aleve and heat has helped.  She has not noticed any change in urination or dark/tea colored urine.  She is also noticed a knot in her left medial thigh that has been there for months.  It is not painful or growing.  No fevers.  Past Medical History:  Diagnosis Date   Hypertension    Lumbar disc disease 08/31/2018   Obesity    Pre-diabetes    Vitamin D deficiency     Patient Active Problem List   Diagnosis Date Noted   Gout 05/18/2020   History of colon polyps 05/18/2020   Change in pigmented skin lesion 05/18/2020   Alopecia of scalp 12/22/2018   CKD stage 2 due to type 2 diabetes mellitus ([place]) 12/30/2017   Medication management 04/12/2015   Morbid obesity ([place]) 09/16/2013   Type 2 diabetes with stage 2 chronic kidney disease GFR 60-89 ([place]) 01/07/2013   Essential hypertension 01/03/2013   Hyperlipidemia associated with type 2 diabetes mellitus ([place]) 01/03/2013   Vitamin D deficiency 01/03/2013    Past Surgical History:  Procedure Laterality Date   CARPAL TUNNEL RELEASE Right    CESAREAN SECTION  1971   KNEE ARTHROSCOPY Right    TUBAL LIGATION       OB History     Gravida  2   Para  1   Term      Preterm      AB  1   Living         SAB      IAB      Ectopic      Multiple      Live Births              Family History  Problem Relation Age of Onset   Stroke Mother    Hypertension Sister    Thyroid disease Sister    Aneurysm Sister    Heart disease Brother    Diabetes Brother    Heart disease Brother    Diabetes Brother    COPD Brother     Social History   Tobacco Use   Smoking status: Never    Smokeless tobacco: Never  Vaping Use   Vaping Use: Never used  Substance Use Topics   Alcohol use: Not Currently    Comment: Rarely; socially   Drug use: No    Home Medications Prior to Admission medications   Medication Sig Start Date End Date Taking? Authorizing Provider  allopurinol (ZYLOPRIM) 300 MG tablet TAKE 1 TABLET DAILY TO PREVENT GOUT 05/04/20  Yes Unk Pinto, MD  amLODipine (NORVASC) 5 MG tablet Take 1 tablet (5 mg total) by mouth daily. 05/18/20 05/18/21 Yes Liane Comber, NP  Cholecalciferol (VITAMIN D3) 25 MCG (1000 UT) CAPS Take 4000 IU daily. 05/19/20  Yes Liane Comber, NP  diclofenac Sodium (VOLTAREN) 1 % GEL Apply 4 g topically 4 (four) times daily. 06/18/19  Yes Vladimir Crofts, PA-C  furosemide (LASIX) 40 MG tablet Take  1 tablet  2 x /day  for BP & Fluid Retention / Edema 05/01/20  Yes Unk Pinto, MD  Magnesium 250 MG  TABS Take by mouth daily.   Yes [provider]  aspirin 81 MG tablet Take 81 mg by mouth daily.    [provider]    Allergies    Ziac [bisoprolol-hydrochlorothiazide]  Review of Systems   Review of Systems  Constitutional:  Negative for fever.  Cardiovascular:  Negative for leg swelling.  Musculoskeletal:  Positive for myalgias.  Neurological:  Negative for weakness and numbness.   Physical Exam Updated Vital Signs BP (!) 148/86   Pulse 77   Temp 97.8 F (36.6 C) (Oral)   Resp 18   Ht 5\' 1"  (1.549 m)   Wt 83.9 kg   SpO2 100%   BMI 34.96 kg/m   Physical Exam Vitals and nursing note reviewed.  Constitutional:      Appearance: She is well-developed.  HENT:     Head: Normocephalic and atraumatic.     Right Ear: External ear normal.     Left Ear: External ear normal.     Nose: Nose normal.  Eyes:     General:        Right eye: No discharge.        Left eye: No discharge.  Cardiovascular:     Rate and Rhythm: Normal rate and regular rhythm.     Pulses:          Dorsalis pedis pulses are 2+ on  the right side.  Pulmonary:     Effort: Pulmonary effort is normal.  Abdominal:     General: There is no distension.  Musculoskeletal:     Right upper leg: Tenderness present. No swelling.       Legs:  Skin:    General: Skin is warm and dry.  Neurological:     Mental Status: She is alert.  Psychiatric:        Mood and Affect: Mood is not anxious.    ED Results / Procedures / Treatments   Labs (all labs ordered are listed, but only abnormal results are displayed) Labs Reviewed - No data to display  EKG None  Radiology No results found.  Procedures Procedures   Medications Ordered in ED Medications  Rivaroxaban (XARELTO) tablet 15 mg (has no administration in time range)    ED Course  I have reviewed the triage vital signs and the nursing notes.  Pertinent labs & imaging results that were available during my care of the patient were reviewed by me and considered in my medical decision making (see chart for details).    MDM Rules/Calculators/A&P                           Unclear what this subcutaneous nodule is on her left leg.  As for her right thigh, given she recently had surgery on her foot I think it is prudent to rule out DVT.  She does not have unilateral swelling but is having unilateral pain.  Could be just a regular cramp but has persisted for couple days.  Unfortunately our ultrasound machine is currently unavailable due to it needs to be deep and per staff.  It is unclear when this can become available.  Patient would rather just get a dose of Xarelto and follow-up tomorrow for ultrasound.  She has no PE signs/symptoms and appears stable for discharge home with return precautions. Final Clinical Impression(s) / ED Diagnoses Final diagnoses:  Acute pain of right thigh    Rx / DC Orders ED Discharge Orders  Ordered    US Venous Img Lower Unilateral Right        07/27/20 1359             Sherwood Gambler, MD 07/27/20 1426

## 2020-07-28 ENCOUNTER — Ambulatory Visit (HOSPITAL_BASED_OUTPATIENT_CLINIC_OR_DEPARTMENT_OTHER)
Admission: RE | Admit: 2020-07-28 | Discharge: 2020-07-28 | Disposition: A | Discharge status: Home / Self Care | Payer: Medicare PPO | Source: Ambulatory Visit | Attending: Emergency Medicine | Admitting: Emergency Medicine

## 2020-07-28 ENCOUNTER — Emergency Department (HOSPITAL_BASED_OUTPATIENT_CLINIC_OR_DEPARTMENT_OTHER): Admit: 2020-07-28 | Payer: Medicare PPO

## 2020-07-28 DIAGNOSIS — M7121 Synovial cyst of popliteal space [Baker], right knee: Secondary | ICD-10-CM | POA: Diagnosis not present

## 2020-07-28 DIAGNOSIS — M79604 Pain in right leg: Secondary | ICD-10-CM | POA: Insufficient documentation

## 2020-07-31 DIAGNOSIS — S91302A Unspecified open wound, left foot, initial encounter: Secondary | ICD-10-CM | POA: Diagnosis not present

## 2020-07-31 DIAGNOSIS — T8189XA Other complications of procedures, not elsewhere classified, initial encounter: Secondary | ICD-10-CM | POA: Diagnosis not present

## 2020-07-31 DIAGNOSIS — D0372 Melanoma in situ of left lower limb, including hip: Secondary | ICD-10-CM | POA: Diagnosis not present

## 2020-07-31 HISTORY — PX: SKIN GRAFT SPLIT THICKNESS LEG / FOOT: SUR1303

## 2020-08-17 ENCOUNTER — Encounter: Payer: Self-pay | Admitting: Adult Health

## 2020-08-17 NOTE — Progress Notes (Signed)
ANNUAL MEDICARE VISIT AND FOLLOW UP  Assessment and Plan:   Annual Medicare Wellness Visit Due annually  Health maintenance reviewed Diabetes eye report requested  Type 2 diabetes mellitus with stage 2 chronic kidney disease, without long-term current use of insulin (HCC) -     Hemoglobin A1c Discussed general issues about diabetes pathophysiology and management., Educational material distributed., Suggested low cholesterol diet., Encouraged aerobic exercise., Discussed foot care., Reminded to get yearly retinal exam.  CKD stage 2 due to type 2 diabetes mellitus ([place]) Discussed general issues about diabetes pathophysiology and management., Educational material distributed., Suggested low cholesterol diet., Encouraged aerobic exercise., Discussed foot care., Reminded to get yearly retinal exam. -     Hemoglobin A1c  Hyperlipidemia associated with type 2 diabetes mellitus ([place]) Currently treated by lifestyle LDL goal <70,discussed low dose statin but declines Current guidelines and risk of cardiovascular disease reviewed She plans to make lifestyle changes - reduce saturated fat, increase soluble fiber through diet; reviewed in detail decrease fatty foods increase activity.  -     Lipid panel  Morbid obesity ([place]) - follow up 3 months for progress monitoring - increase veggies, decrease carbs - long discussion about weight loss, diet, and exercise  Essential hypertension - continue medications, DASH diet, exercise and monitor at home. Call if greater than 130/80.  -     CBC with Differential/Platelet -     COMPLETE METABOLIC PANEL WITH GFR -     TSH  Gout involving toe, unspecified cause, unspecified laterality Continue allopurinol, check uric acid annually and as needed  Vitamin D deficiency Continue supplement;  Medication management -     Magnesium  Lumbar disc disease Follow up ortho PRN Weight loss advised  Alopecia of scalp Improved, follow up derm  PRN  Melanoma of left foot ([place]) S/p wide excision and skin graft - [facility] derm/plastics is following Denies concerns, doing well   Onychomycosis Discussed treatment options; she plans to try OTC topical therapies Declines prescription at this time   Discussed med's effects and SE's. Screening labs and tests as requested with regular follow-up as recommended. Over 30 minutes of exam, counseling, chart review, and complex, high level critical decision making was performed this visit.   Future Appointments  Date Time Provider [place]  11/23/2020  9:30 AM Unk Pinto, MD [facility] None  05/21/2021  3:00 PM Liane Comber, NP [facility] None  08/21/2021 11:30 AM Liane Comber, NP [facility] None    Plan:   During the course of the visit the patient was educated and counseled about appropriate screening and preventive services including:   Pneumococcal vaccine  Prevnar 13 Influenza vaccine Td vaccine Screening electrocardiogram Bone densitometry screening Colorectal cancer screening Diabetes screening Glaucoma screening Nutrition counseling  Advanced directives: requested   HPI  70 y.o. AA female, presents for AWV and follow up. She has Essential hypertension; Hyperlipidemia associated with type 2 diabetes mellitus ([place]); Vitamin D deficiency; Type 2 diabetes with stage 2 chronic kidney disease GFR 60-89 ([place]); Morbid obesity ([place]); Medication management; CKD stage 2 due to type 2 diabetes mellitus ([place]); Alopecia of scalp; Gout; History of colon polyps; and Malignant melanoma of left foot (HCC) on their problem list.   Divorced/widowed, 1 son, teen granddaughter, close with her niece, very active in church, works Therapist, nutritional.   At CPE in 05/2020 we noted concerning melanotic lesion to left foot, found to be malignant melanoma and under went wide excision 07/07/2020 with split thickness graft from her left thigh  07/31/2020 by Dr. Donnamarie Rossetti at [facility], has  upcoming appointment. She is in boot, doing well.   She had intermittent R hip pain, follows Dr. Ninfa Linden, had MRI back with L4-L5 disc bulge on the right to L5- resolved with shots and PT, no recent issues.    BMI is Body mass index is 36.35 kg/m., she has been working on weight loss. Exercise limited by foot excision surgery. She has seated exercise workbook, trying to do more exercises. Admits snacking more.  Wt Readings from Last 3 Encounters:  08/21/20 192 lb 6.4 oz (87.3 kg)  07/27/20 185 lb (83.9 kg)  05/18/20 186 lb (84.4 kg)   Her blood pressure has been controlled at home, today their BP is BP: 110/80 She does not workout. She denies chest pain, shortness of breath, dizziness.  Takes amlodipine 5 mg, recently switched from HCTZ to lasix due to edema, improved.   She is not on cholesterol medication, currently managed by lifestyle changes. Her cholesterol is not at goal. Declines med for now. The cholesterol last visit was:   Lab Results  Component Value Date   CHOL 163 05/18/2020   HDL 48 (L) 05/18/2020   LDLCALC 92 05/18/2020   TRIG 134 05/18/2020   CHOLHDL 3.4 05/18/2020   She has been working on diet and exercise for T2DM  currently managed by lifestyle modification [facility]EE Dr. [facility] - last 2022 - report requested  she is on bASA  she is not on ACE/ARB denies increased appetite, nausea, paresthesia of the feet, polydipsia, polyuria, visual disturbances and vomiting.  Last A1C in the office was:  Lab Results  Component Value Date   HGBA1C 6.1 (H) 05/18/2020   Last GFR: Lab Results  Component Value Date   GFRAA 80 05/18/2020   Patient is on Vitamin D supplement, increased to taking 4000 IU daily  Lab Results  Component Value Date   VD25OH 37 05/18/2020    Patient is on allopurinol for gout and does not report a recent flare.  Lab Results  Component Value Date   LABURIC 4.2 05/18/2020     Current Medications:  Current Outpatient Medications on  File Prior to Visit  Medication Sig Dispense Refill   allopurinol (ZYLOPRIM) 300 MG tablet TAKE 1 TABLET DAILY TO PREVENT GOUT 90 tablet 3   amLODipine (NORVASC) 5 MG tablet Take 1 tablet (5 mg total) by mouth daily. 90 tablet 3   aspirin 81 MG tablet Take 81 mg by mouth daily.     Cholecalciferol (VITAMIN D3) 25 MCG (1000 UT) CAPS Take 4000 IU daily. 120 capsule    diclofenac Sodium (VOLTAREN) 1 % GEL Apply 4 g topically 4 (four) times daily. 100 g 3   furosemide (LASIX) 40 MG tablet Take  1 tablet  2 x /day  for BP & Fluid Retention / Edema 180 tablet 3   Magnesium 250 MG TABS Take by mouth daily.     No current facility-administered medications on file prior to visit.   Allergies:  Allergies  Allergen Reactions   Ziac [Bisoprolol-Hydrochlorothiazide]     Flushing    Medical History:  She has Essential hypertension; Hyperlipidemia associated with type 2 diabetes mellitus ([place]); Vitamin D deficiency; Type 2 diabetes with stage 2 chronic kidney disease GFR 60-89 ([place]); Morbid obesity ([place]); Medication management; CKD stage 2 due to type 2 diabetes mellitus ([place]); Alopecia of scalp; Gout; History of colon polyps; and Malignant melanoma of left foot ([place]) on their problem list.  Health Maintenance:   Immunization History  Administered Date(s) Administered   Influenza, High Dose Seasonal PF 05/04/2018   PFIZER(Purple Top)SARS-COV-2 Vaccination 01/31/2019, 02/27/2019, 12/25/2019   PPD Test 04/26/2013   Td 05/18/2020   Preventative care: Last colonoscopy: 09/2014, benign polyps, 10 year follow up, Dr. Collene Mares EGD: 09/2014 Dr. Collene Mares Last mammogram: 08/2019, she has phone number to schedule Last pap smear/pelvic exam: 2016 declines another DEXA: 2013   Prior vaccinations: TD or Tdap: 05/18/2020  Influenza: 2020, declines Pneumococcal: declines Prevnar13: declines Shingles/Zostavax: declines COVID 19: 2/2+ last 12/24/2020  Names of Other Physician/Practitioners you currently use: 1.  [facility] here for primary care 2. Dr. Truman Hayward, eye doctor, last visit 2022 -report requested 3. Dr. Sallee Lange, dentist, last visit 07/2020, q36m Patient Care Team: MUnk Pinto MD as PCP - General (Internal Medicine) MJuanita Craver MD as Consulting Physician (Gastroenterology)  Surgical History:  She has a past surgical history that includes Cesarean section (01/07/1969); Knee arthroscopy (Right); Carpal tunnel release (Right); Tubal ligation; Skin graft split thickness leg / foot (Left, 07/31/2020); and Skin surgery (Left, 07/07/2020). Family History:  Herfamily history includes Aneurysm in her sister; COPD in her brother; Diabetes in her brother and brother; Heart disease in her brother and brother; Hypertension in her sister; Stroke in her mother; Thyroid disease in her sister. Social History:  She reports that she has never smoked. She has never used smokeless tobacco. She reports that she does not currently use alcohol. She reports that she does not use drugs.  Review of Systems: Review of Systems  Constitutional:  Negative for malaise/fatigue and weight loss.  HENT:  Negative for hearing loss and tinnitus.   Eyes:  Negative for blurred vision and double vision.  Respiratory:  Negative for cough, shortness of breath and wheezing.   Cardiovascular:  Negative for chest pain, palpitations, orthopnea, claudication and leg swelling.  Gastrointestinal:  Negative for abdominal pain, blood in stool, constipation, diarrhea, heartburn, melena, nausea and vomiting.  Genitourinary: Negative.   Musculoskeletal:  Negative for joint pain and myalgias.  Skin:  Negative for rash.  Neurological:  Negative for dizziness, tingling, sensory change, weakness and headaches.  Endo/Heme/Allergies:  Negative for polydipsia.  Psychiatric/Behavioral: Negative.  Negative for depression. The patient is not nervous/anxious and does not have insomnia.   All other systems  reviewed and are negative.  Physical Exam: Estimated body mass index is 36.35 kg/m as calculated from the following:   Height as of 07/27/20: '5\' 1"'$  (1.549 m).   Weight as of this encounter: 192 lb 6.4 oz (87.3 kg). BP 110/80   Pulse 91   Temp (!) 97.5 F (36.4 C)   Wt 192 lb 6.4 oz (87.3 kg)   SpO2 98%   BMI 36.35 kg/m  General Appearance: Well nourished, in no apparent distress.  Eyes: PERRLA, EOMs, conjunctiva no swelling or erythema Sinuses: No Frontal/maxillary tenderness  ENT/Mouth: Ext aud canals clear, normal light reflex with TMs without erythema, bulging. Good dentition. No erythema, swelling, or exudate on post pharynx. Tonsils not swollen or erythematous. Hearing normal.  Neck: Supple, thyroid normal. No bruits  Respiratory: Respiratory effort normal, BS equal bilaterally without rales, rhonchi, wheezing or stridor.  Cardio: RRR without murmurs, rubs or gallops. Brisk peripheral pulses without edema.  Chest: symmetric, with normal excursions and percussion.  Abdomen: Soft, nontender, no guarding, rebound, hernias, masses, or organomegaly.  Lymphatics: Non tender without lymphadenopathy.  Musculoskeletal: Full ROM all peripheral extremities, 5/5 strength (except left  ankle deferred - in boot), and normal gait.  Skin: Warm, dry without rashes, ecchymosis. Well healing graft site to left thigh with xeroderm dressing; left foot in boot. She has thickened toenails distally, bil feet, 1st and 2nd digits Neuro: Cranial nerves intact, reflexes equal bilaterally. Normal muscle tone, no cerebellar symptoms. Sensation intact.  Psych: Awake and oriented X 3, normal affect, Insight and Judgment appropriate.    Medicare Attestation I have personally reviewed: The patient's medical and social history Their use of alcohol, tobacco or illicit drugs Their current medications and supplements The patient's functional ability including ADLs,fall risks, home safety risks, cognitive, and  hearing and visual impairment Diet and physical activities Evidence for depression or mood disorders  The patient's weight, height, BMI, and visual acuity have been recorded in the chart.  I have made referrals, counseling, and provided education to the patient based on review of the above and I have provided the patient with a written personalized care plan for preventive services.    Izora Ribas, NP 12:03 PM [facility]

## 2020-08-21 ENCOUNTER — Encounter: Payer: Self-pay | Admitting: Adult Health

## 2020-08-21 ENCOUNTER — Ambulatory Visit (INDEPENDENT_AMBULATORY_CARE_PROVIDER_SITE_OTHER): Payer: Medicare PPO | Admitting: Adult Health

## 2020-08-21 ENCOUNTER — Other Ambulatory Visit: Payer: Self-pay

## 2020-08-21 VITALS — BP 110/80 | HR 91 | Temp 97.5°F | Wt 192.4 lb

## 2020-08-21 DIAGNOSIS — E559 Vitamin D deficiency, unspecified: Secondary | ICD-10-CM

## 2020-08-21 DIAGNOSIS — Z79899 Other long term (current) drug therapy: Secondary | ICD-10-CM

## 2020-08-21 DIAGNOSIS — L659 Nonscarring hair loss, unspecified: Secondary | ICD-10-CM

## 2020-08-21 DIAGNOSIS — E785 Hyperlipidemia, unspecified: Secondary | ICD-10-CM | POA: Diagnosis not present

## 2020-08-21 DIAGNOSIS — B351 Tinea unguium: Secondary | ICD-10-CM | POA: Diagnosis not present

## 2020-08-21 DIAGNOSIS — N182 Chronic kidney disease, stage 2 (mild): Secondary | ICD-10-CM

## 2020-08-21 DIAGNOSIS — R6889 Other general symptoms and signs: Secondary | ICD-10-CM | POA: Diagnosis not present

## 2020-08-21 DIAGNOSIS — I10 Essential (primary) hypertension: Secondary | ICD-10-CM

## 2020-08-21 DIAGNOSIS — E1122 Type 2 diabetes mellitus with diabetic chronic kidney disease: Secondary | ICD-10-CM

## 2020-08-21 DIAGNOSIS — E1169 Type 2 diabetes mellitus with other specified complication: Secondary | ICD-10-CM | POA: Diagnosis not present

## 2020-08-21 DIAGNOSIS — Z0000 Encounter for general adult medical examination without abnormal findings: Secondary | ICD-10-CM

## 2020-08-21 DIAGNOSIS — C4372 Malignant melanoma of left lower limb, including hip: Secondary | ICD-10-CM | POA: Diagnosis not present

## 2020-08-21 DIAGNOSIS — Z86010 Personal history of colonic polyps: Secondary | ICD-10-CM

## 2020-08-21 DIAGNOSIS — M1A9XX0 Chronic gout, unspecified, without tophus (tophi): Secondary | ICD-10-CM

## 2020-08-21 DIAGNOSIS — Z0001 Encounter for general adult medical examination with abnormal findings: Secondary | ICD-10-CM

## 2020-08-21 NOTE — Patient Instructions (Signed)
Fungal Nail Infection A fungal nail infection is a common infection of the toenails or fingernails. This condition affects toenails more often than fingernails. It often affects the great, or big, toes. More than one nail may be infected. The condition can be passed from person to person (is contagious). What are the causes? This condition is caused by a fungus. Several types of fungi can cause the infection. These fungi are common in moist and warm areas. If your hands or feet come into contact with the fungus, it may get into a crack in yourfingernail or toenail and cause the infection. What increases the risk? The following factors may make you more likely to develop this condition: Being female. Being of older age. Living with someone who has the fungus. Walking barefoot in areas where the fungus thrives, such as showers or locker rooms. Wearing shoes and socks that cause your feet to sweat. Having a nail injury or a recent nail surgery. Having certain medical conditions, such as: Athlete's foot. Diabetes. Psoriasis. Poor circulation. A weak body defense system (immune system). What are the signs or symptoms? Symptoms of this condition include: A pale spot on the nail. Thickening of the nail. A nail that becomes yellow or brown. A brittle or ragged nail edge. A crumbling nail. A nail that has lifted away from the nail bed. How is this diagnosed? This condition is diagnosed with a physical exam. Your health care provider maytake a scraping or clipping from your nail to test for the fungus. How is this treated? Treatment is not needed for mild infections. If you have significant nail changes, treatment may include: Antifungal medicines taken by mouth (orally). You may need to take the medicine for several weeks or several months, and you may not see the results for a long time. These medicines can cause side effects. Ask your health care provider what problems to watch for. Antifungal  nail polish or nail cream. These may be used along with oral antifungal medicines. Laser treatment of the nail. Surgery to remove the nail. This may be needed for the most severe infections. It can take a long time, usually up to a year, for the infection to go away.The infection may also come back. Follow these instructions at home: Medicines Take or apply over-the-counter and prescription medicines only as told by your health care provider. Ask your health care provider about using over-the-counter mentholated ointment on your nails. Nail care Trim your nails often. Wash and dry your hands and feet every day. Keep your feet dry: Wear absorbent socks, and change your socks frequently. Wear shoes that allow air to circulate, such as sandals or canvas tennis shoes. Throw out old shoes. Do not use artificial nails. If you go to a nail salon, make sure you choose one that uses clean instruments. Use antifungal foot powder on your feet and in your shoes. General instructions Do not share personal items, such as towels or nail clippers. Do not walk barefoot in shower rooms or locker rooms. Wear rubber gloves if you are working with your hands in wet areas. Keep all follow-up visits as told by your health care provider. This is important. Contact a health care provider if: Your infection is not getting better or it is getting worse after severalmonths. Summary A fungal nail infection is a common infection of the toenails or fingernails. Treatment is not needed for mild infections. If you have significant nail changes, treatment may include taking medicine orally and applying medicine to  your nails. It can take a long time, usually up to a year, for the infection to go away. The infection may also come back. Take or apply over-the-counter and prescription medicines only as told by your health care provider. Follow instructions for taking care of your nails to help prevent infection from coming  back or spreading. This information is not intended to replace advice given to you by your health care provider. Make sure you discuss any questions you have with your healthcare provider. Document Revised: 04/16/2018 Document Reviewed: 05/30/2017 Elsevier Patient Education  [place].

## 2020-08-22 LAB — LIPID PANEL
Cholesterol: 146 mg/dL (ref <200)
HDL: 52 mg/dL (ref >=50)
LDL Cholesterol (Calc): 74 mg/dL (calc)
Non-HDL Cholesterol (Calc): 94 mg/dL (calc) (ref <130)
Total CHOL/HDL Ratio: 2.8 (calc) (ref <5.0)
Triglycerides: 114 mg/dL (ref <150)

## 2020-08-22 LAB — COMPLETE METABOLIC PANEL WITH GFR
AG Ratio: 1.6 (calc) (ref 1.0–2.5)
ALT: 4 U/L — ABNORMAL LOW (ref 6–29)
AST: 12 U/L (ref 10–35)
Albumin: 4.1 g/dL (ref 3.6–5.1)
Alkaline phosphatase (APISO): 83 U/L (ref 37–153)
BUN: 14 mg/dL (ref 7–25)
CO2: 28 mmol/L (ref 20–32)
Calcium: 9.5 mg/dL (ref 8.6–10.4)
Chloride: 106 mmol/L (ref 98–110)
Creat: 0.92 mg/dL (ref 0.60–1.00)
Globulin: 2.5 g/dL (calc) (ref 1.9–3.7)
Glucose, Bld: 88 mg/dL (ref 65–99)
Potassium: 4.1 mmol/L (ref 3.5–5.3)
Sodium: 140 mmol/L (ref 135–146)
Total Bilirubin: 0.5 mg/dL (ref 0.2–1.2)
Total Protein: 6.6 g/dL (ref 6.1–8.1)
eGFR: 67 mL/min/{1.73_m2} (ref >=60)

## 2020-08-22 LAB — CBC WITH DIFFERENTIAL/PLATELET
Absolute Monocytes: 651 cells/uL (ref 200–950)
Basophils Absolute: 63 cells/uL (ref 0–200)
Basophils Relative: 0.9 %
Eosinophils Absolute: 259 cells/uL (ref 15–500)
Eosinophils Relative: 3.7 %
HCT: 39.9 % (ref 35.0–45.0)
Hemoglobin: 13.3 g/dL (ref 11.7–15.5)
Lymphs Abs: 2275 cells/uL (ref 850–3900)
MCH: 28.8 pg (ref 27.0–33.0)
MCHC: 33.3 g/dL (ref 32.0–36.0)
MCV: 86.4 fL (ref 80.0–100.0)
MPV: 10.4 fL (ref 7.5–12.5)
Monocytes Relative: 9.3 %
Neutro Abs: 3752 cells/uL (ref 1500–7800)
Neutrophils Relative %: 53.6 %
Platelets: 277 10*3/uL (ref 140–400)
RBC: 4.62 10*6/uL (ref 3.80–5.10)
RDW: 13.3 % (ref 11.0–15.0)
Total Lymphocyte: 32.5 %
WBC: 7 10*3/uL (ref 3.8–10.8)

## 2020-08-22 LAB — HEMOGLOBIN A1C
Hgb A1c MFr Bld: 6 % of total Hgb — ABNORMAL HIGH (ref <5.7)
Mean Plasma Glucose: 126 mg/dL
eAG (mmol/L): 7 mmol/L

## 2020-08-22 LAB — MAGNESIUM: Magnesium: 1.8 mg/dL (ref 1.5–2.5)

## 2020-08-22 LAB — TSH: TSH: 1.61 mIU/L (ref 0.40–4.50)

## 2020-10-10 DIAGNOSIS — R609 Edema, unspecified: Secondary | ICD-10-CM | POA: Diagnosis not present

## 2020-10-10 DIAGNOSIS — M109 Gout, unspecified: Secondary | ICD-10-CM | POA: Diagnosis not present

## 2020-10-10 DIAGNOSIS — I10 Essential (primary) hypertension: Secondary | ICD-10-CM | POA: Diagnosis not present

## 2020-10-10 DIAGNOSIS — Z85828 Personal history of other malignant neoplasm of skin: Secondary | ICD-10-CM | POA: Diagnosis not present

## 2020-10-10 DIAGNOSIS — Z809 Family history of malignant neoplasm, unspecified: Secondary | ICD-10-CM | POA: Diagnosis not present

## 2020-10-10 DIAGNOSIS — Z6835 Body mass index (BMI) 35.0-35.9, adult: Secondary | ICD-10-CM | POA: Diagnosis not present

## 2020-10-10 DIAGNOSIS — Z7982 Long term (current) use of aspirin: Secondary | ICD-10-CM | POA: Diagnosis not present

## 2020-11-07 ENCOUNTER — Other Ambulatory Visit: Payer: Self-pay | Admitting: Internal Medicine

## 2020-11-07 DIAGNOSIS — Z1231 Encounter for screening mammogram for malignant neoplasm of breast: Secondary | ICD-10-CM

## 2020-11-16 ENCOUNTER — Encounter: Payer: Self-pay | Admitting: Internal Medicine

## 2020-11-22 ENCOUNTER — Encounter: Payer: Self-pay | Admitting: Internal Medicine

## 2020-11-22 MED ORDER — ASPIRIN EC 81 MG PO TBEC: DELAYED_RELEASE_TABLET | ORAL | Status: AC

## 2020-11-22 NOTE — Progress Notes (Signed)
Future Appointments  Date Time Provider [place]  11/23/2020  9:30 AM Unk Pinto, MD [facility] None  05/21/2021  3:00 PM Liane Comber, NP [facility] None  08/21/2021 11:30 AM Liane Comber, NP [facility] None    History of Present Illness:       This very nice 70 y.o. DBF presents for 3 month follow up with HTN, HLD, T2_DM w/CKD 2 and Vitamin D Deficiency.        Patient is treated for HTN (2002)  & BP has been controlled at home. Today's BP is at goal - 130/70. Patient has had no complaints of any cardiac type chest pain, palpitations, dyspnea / orthopnea / PND, dizziness, claudication, or dependent edema.       Hyperlipidemia is controlled with diet & meds. Patient denies myalgias or other med SE's. Last Lipids were  at goal :  Lab Results  Component Value Date   CHOL 146 08/21/2020   HDL 52 08/21/2020   LDLCALC 74 08/21/2020   TRIG 114 08/21/2020   CHOLHDL 2.8 08/21/2020     Also, the patient has Morbid Obesity (BMI 36+) and  hx/o T2 _DM predating from 2015 which she's been managing with diet and has had no symptoms of reactive hypoglycemia, diabetic polys, paresthesias or visual blurring.  Last A1c was not at goal :  Lab Results  Component Value Date   HGBA1C 6.0 (H) 08/21/2020                                                         Further, the patient also has history of Vitamin D Deficiency ("32" /2016) and supplements vitamin D without any suspected side-effects. Last vitamin D was still low :  Lab Results  Component Value Date   VD25OH 37 05/18/2020     Current Outpatient Medications on File Prior to Visit  Medication Sig   allopurinol  300 MG tablet TAKE 1 TABLET DAILY TO PREVENT GOUT   amLODipine 5 MG tablet Take 1 tablet (daily.   aspirin 81 MG tablet Take daily.   VITAMIN D 2,000 u Take 4000 IU daily.   diclofenac  % GEL Apply 4 g topically 4 times daily.   furosemide 40 MG tablet Take  1 tablet  2 x /day     Magnesium 250 MG  TABS Take  daily.     Allergies  Allergen Reactions   Ziac [Bisoprolol-Hydrochlorothiazide] Flushing       PMHx:   Past Medical History:  Diagnosis Date   Hypertension    Lumbar disc disease 08/31/2018   Melanoma ([place]) 2022   Obesity    Pre-diabetes    Vitamin D deficiency      Immunization History  Administered Date(s) Administered   Influenza, High Dose  05/04/2018   PFIZER  SARS-COV-2 Vacc 01/31/2019, 02/27/2019, 12/25/2019   PPD Test 04/26/2013   Td 05/18/2020     Past Surgical History:  Procedure Laterality Date   CARPAL TUNNEL RELEASE Right    CESAREAN SECTION  01/07/1969   KNEE ARTHROSCOPY Right    SKIN GRAFT SPLIT THICKNESS LEG / FOOT Left 07/31/2020   Melanoma excision with graft, Dr. Lisabeth Register, [facility]   SKIN SURGERY Left 07/07/2020   Wide excision lt foot melanoma, Dr. Donnamarie Rossetti, [place]  FHx:    Reviewed / unchanged  SHx:    Reviewed / unchanged   Systems Review:  Constitutional: Denies fever, chills, wt changes, headaches, insomnia, fatigue, night sweats, change in appetite. Eyes: Denies redness, blurred vision, diplopia, discharge, itchy, watery eyes.  ENT: Denies discharge, congestion, post nasal drip, epistaxis, sore throat, earache, hearing loss, dental pain, tinnitus, vertigo, sinus pain, snoring.  CV: Denies chest pain, palpitations, irregular heartbeat, syncope, dyspnea, diaphoresis, orthopnea, PND, claudication or edema. Respiratory: denies cough, dyspnea, DOE, pleurisy, hoarseness, laryngitis, wheezing.  Gastrointestinal: Denies dysphagia, odynophagia, heartburn, reflux, water brash, abdominal pain or cramps, nausea, vomiting, bloating, diarrhea, constipation, hematemesis, melena, hematochezia  or hemorrhoids. Genitourinary: Denies dysuria, frequency, urgency, nocturia, hesitancy, discharge, hematuria or flank pain. Musculoskeletal: Denies arthralgias, myalgias, stiffness, jt. swelling, pain, limping or  strain/sprain.  Skin: Denies pruritus, rash, hives, warts, acne, eczema or change in skin lesion(s). Neuro: No weakness, tremor, incoordination, spasms, paresthesia or pain. Psychiatric: Denies confusion, memory loss or sensory loss. Endo: Denies change in weight, skin or hair change.  Heme/Lymph: No excessive bleeding, bruising or enlarged lymph nodes.  Physical Exam  BP 130/70   Pulse 85   Temp 97.9 F (36.6 C)   Resp 16   Ht 4' 11.5" (1.511 m)   Wt 193 lb (87.5 kg)   SpO2 99%   BMI 38.33 kg/m   Appears  over nourished  and in no distress.  Eyes: PERRLA, EOMs, conjunctiva no swelling or erythema. Sinuses: No frontal/maxillary tenderness ENT/Mouth: EAC's clear, TM's nl w/o erythema, bulging. Nares clear w/o erythema, swelling, exudates. Oropharynx clear without erythema or exudates. Oral hygiene is good. Tongue normal, non obstructing. Hearing intact.  Neck: Supple. Thyroid not palpable. Car 2+/2+ without bruits, nodes or JVD. Chest: Respirations nl with BS clear & equal w/o rales, rhonchi, wheezing or stridor.  Cor: Heart sounds normal w/ regular rate and rhythm without sig. murmurs, gallops, clicks or rubs. Peripheral pulses normal and equal  without edema.  Abdomen: Soft & bowel sounds normal. Non-tender w/o guarding, rebound, hernias, masses or organomegaly.  Lymphatics: Unremarkable.  Musculoskeletal: Full ROM all peripheral extremities, joint stability, 5/5 strength and normal gait.  Skin: Warm, dry without exposed rashes, lesions or ecchymosis apparent.  Neuro: Cranial nerves intact, reflexes equal bilaterally. Sensory-motor testing grossly intact. Tendon reflexes grossly intact.  Pysch: Alert & oriented x 3.  Insight and judgement nl & appropriate. No ideations.  Assessment and Plan:  1. Essential hypertension  - Continue medication, monitor blood pressure at home.  - Continue DASH diet.  Reminder to go to the ER if any CP,  SOB, nausea, dizziness, severe HA, changes  vision/speech.   - CBC with Differential/Platelet - COMPLETE METABOLIC PANEL WITH GFR - Magnesium - TSH  2. Hyperlipidemia associated with type 2 diabetes mellitus ([place])  - Continue diet/meds, exercise,& lifestyle modifications.  - Continue monitor periodic cholesterol/liver & renal functions   - Lipid panel - TSH  3. Type 2 diabetes mellitus with stage 2 chronic kidney  disease, without long-term current use of insulin (HCC)  - Continue diet, exercise  - Lifestyle modifications.  - Monitor appropriate labs    - Fructosamine - Insulin, random  4. Vitamin D deficiency  - Continue supplementation   - VITAMIN D 25 Hydroxy   5. Gout  - Uric acid  6. Class 3 severe obesity due to excess calories with serious comorbidity ([place])   7. Medication management  - CBC with Differential/Platelet - COMPLETE METABOLIC PANEL WITH GFR -  Magnesium - Lipid panel - TSH - Fructosamine - Insulin, random - VITAMIN D 25 Hydroxy         Discussed  regular exercise, BP monitoring, weight control to achieve/maintain BMI less than 25 and discussed med and SE's. Recommended labs to assess and monitor clinical status with further disposition pending results of labs.  I discussed the assessment and treatment plan with the patient. The patient was provided an opportunity to ask questions and all were answered. The patient agreed with the plan and demonstrated an understanding of the instructions.  I provided over 30 minutes of exam, counseling, chart review and  complex critical decision making.        The patient was advised to call back or seek an in-person evaluation if the symptoms worsen or if the condition fails to improve as anticipated.   Kirtland Bouchard, MD

## 2020-11-22 NOTE — Patient Instructions (Signed)

## 2020-11-23 ENCOUNTER — Encounter: Payer: Self-pay | Admitting: Internal Medicine

## 2020-11-23 ENCOUNTER — Other Ambulatory Visit: Payer: Self-pay

## 2020-11-23 ENCOUNTER — Ambulatory Visit: Payer: Medicare PPO

## 2020-11-23 ENCOUNTER — Ambulatory Visit (INDEPENDENT_AMBULATORY_CARE_PROVIDER_SITE_OTHER): Payer: Medicare PPO | Admitting: Internal Medicine

## 2020-11-23 VITALS — BP 130/70 | HR 85 | Temp 97.9°F | Resp 16 | Ht 59.5 in | Wt 193.0 lb

## 2020-11-23 DIAGNOSIS — E1122 Type 2 diabetes mellitus with diabetic chronic kidney disease: Secondary | ICD-10-CM

## 2020-11-23 DIAGNOSIS — M109 Gout, unspecified: Secondary | ICD-10-CM | POA: Diagnosis not present

## 2020-11-23 DIAGNOSIS — Z79899 Other long term (current) drug therapy: Secondary | ICD-10-CM | POA: Diagnosis not present

## 2020-11-23 DIAGNOSIS — N182 Chronic kidney disease, stage 2 (mild): Secondary | ICD-10-CM | POA: Diagnosis not present

## 2020-11-23 DIAGNOSIS — E785 Hyperlipidemia, unspecified: Secondary | ICD-10-CM | POA: Diagnosis not present

## 2020-11-23 DIAGNOSIS — E1169 Type 2 diabetes mellitus with other specified complication: Secondary | ICD-10-CM | POA: Diagnosis not present

## 2020-11-23 DIAGNOSIS — I10 Essential (primary) hypertension: Secondary | ICD-10-CM

## 2020-11-23 DIAGNOSIS — E559 Vitamin D deficiency, unspecified: Secondary | ICD-10-CM | POA: Diagnosis not present

## 2020-11-24 ENCOUNTER — Ambulatory Visit: Payer: Medicare PPO

## 2020-11-24 NOTE — Progress Notes (Signed)
============================================================ ============================================================  -     Magnesium  -   1.8   -  very  low- goal is betw 2.0 - 2.5,   - So..............Marland Kitchen  Recommend that you take  Magnesium 500 mg tablet  x  2 tablets / daily   - also important to eat lots of  leafy green vegetables   - spinach - Kale - collards - greens - okra - asparagus  - broccoli - quinoa - squash - almonds   - black, red, white beans  -  peas - green beans ============================================================ ============================================================  -  Total Chol = 156    & LDL Chol = 80   - Both  Excellent   - Very low risk for Heart Attack  / Stroke ============================================================ ============================================================  -  Vitamin D =- 45 - Is Very Low   - Vitamin D goal is between 70-100.   - Please Increase  your Vitamin D from 4,000 up to 10,000 u /day   - It is very important as a natural anti-inflammatory and helping the  immune system protect against viral infections, like the Covid-19    helping hair, skin, and nails, as well as reducing stroke and  heart attack risk.   - It helps your bones and helps with mood.  - It also decreases numerous cancer risks so please  take it as directed.   - Low Vit D is associated with a 200-300% higher risk for  CANCER   and 200-300% higher risk for HEART   ATTACK  &  STROKE.    - It is also associated with higher death rate at younger ages,   autoimmune diseases like Rheumatoid arthritis, Lupus,  Multiple Sclerosis.     - Also many other serious conditions, like depression, Alzheimer's  Dementia, infertility, muscle aches, fatigue, fibromyalgia   - just to name a few. ============================================================ ============================================================  -  Uric acid / Gout  test is OK - Please continue Dose same  ============================================================ ============================================================  -  All Else - CBC - Kidneys - Electrolytes - Liver - Magnesium & Thyroid    - all  Normal / OK ============================================================ ============================================================

## 2020-11-28 LAB — COMPLETE METABOLIC PANEL WITH GFR
AG Ratio: 1.6 (calc) (ref 1.0–2.5)
ALT: 3 U/L — ABNORMAL LOW (ref 6–29)
AST: 11 U/L (ref 10–35)
Albumin: 4.1 g/dL (ref 3.6–5.1)
Alkaline phosphatase (APISO): 90 U/L (ref 37–153)
BUN: 12 mg/dL (ref 7–25)
CO2: 28 mmol/L (ref 20–32)
Calcium: 9.7 mg/dL (ref 8.6–10.4)
Chloride: 103 mmol/L (ref 98–110)
Creat: 0.79 mg/dL (ref 0.60–1.00)
Globulin: 2.6 g/dL (calc) (ref 1.9–3.7)
Glucose, Bld: 89 mg/dL (ref 65–99)
Potassium: 4.1 mmol/L (ref 3.5–5.3)
Sodium: 140 mmol/L (ref 135–146)
Total Bilirubin: 0.6 mg/dL (ref 0.2–1.2)
Total Protein: 6.7 g/dL (ref 6.1–8.1)
eGFR: 80 mL/min/{1.73_m2} (ref >=60)

## 2020-11-28 LAB — CBC WITH DIFFERENTIAL/PLATELET
Absolute Monocytes: 532 cells/uL (ref 200–950)
Basophils Absolute: 63 cells/uL (ref 0–200)
Basophils Relative: 0.9 %
Eosinophils Absolute: 259 cells/uL (ref 15–500)
Eosinophils Relative: 3.7 %
HCT: 43.6 % (ref 35.0–45.0)
Hemoglobin: 14.3 g/dL (ref 11.7–15.5)
Lymphs Abs: 2240 cells/uL (ref 850–3900)
MCH: 28.5 pg (ref 27.0–33.0)
MCHC: 32.8 g/dL (ref 32.0–36.0)
MCV: 87 fL (ref 80.0–100.0)
MPV: 11 fL (ref 7.5–12.5)
Monocytes Relative: 7.6 %
Neutro Abs: 3906 cells/uL (ref 1500–7800)
Neutrophils Relative %: 55.8 %
Platelets: 259 10*3/uL (ref 140–400)
RBC: 5.01 10*6/uL (ref 3.80–5.10)
RDW: 14 % (ref 11.0–15.0)
Total Lymphocyte: 32 %
WBC: 7 10*3/uL (ref 3.8–10.8)

## 2020-11-28 LAB — INSULIN, RANDOM: Insulin: 11.2 u[IU]/mL

## 2020-11-28 LAB — LIPID PANEL
Cholesterol: 156 mg/dL (ref <200)
HDL: 62 mg/dL (ref >=50)
LDL Cholesterol (Calc): 80 mg/dL (calc)
Non-HDL Cholesterol (Calc): 94 mg/dL (calc) (ref <130)
Total CHOL/HDL Ratio: 2.5 (calc) (ref <5.0)
Triglycerides: 68 mg/dL (ref <150)

## 2020-11-28 LAB — VITAMIN D 25 HYDROXY (VIT D DEFICIENCY, FRACTURES): Vit D, 25-Hydroxy: 45 ng/mL (ref 30–100)

## 2020-11-28 LAB — FRUCTOSAMINE: Fructosamine: 243 umol/L (ref 205–285)

## 2020-11-28 LAB — URIC ACID: Uric Acid, Serum: 3.4 mg/dL (ref 2.5–7.0)

## 2020-11-28 LAB — MAGNESIUM: Magnesium: 1.8 mg/dL (ref 1.5–2.5)

## 2020-11-28 LAB — TSH: TSH: 1.75 mIU/L (ref 0.40–4.50)

## 2020-11-28 NOTE — Progress Notes (Signed)
Pt is aware of lab results and recommendations. She did not have any questions for the provider or nurse.

## 2020-12-09 ENCOUNTER — Other Ambulatory Visit (HOSPITAL_BASED_OUTPATIENT_CLINIC_OR_DEPARTMENT_OTHER): Payer: Self-pay

## 2020-12-09 MED ORDER — PFIZER COVID-19 VAC BIVALENT 30 MCG/0.3ML IM SUSP
INTRAMUSCULAR | 0 refills | Status: DC
  Filled 2020-12-09: qty 0.3, 1d supply, fill #0

## 2020-12-11 ENCOUNTER — Other Ambulatory Visit (HOSPITAL_BASED_OUTPATIENT_CLINIC_OR_DEPARTMENT_OTHER): Payer: Self-pay

## 2020-12-12 ENCOUNTER — Ambulatory Visit: Payer: Medicare PPO

## 2021-01-03 ENCOUNTER — Ambulatory Visit
Admission: RE | Admit: 2021-01-03 | Discharge: 2021-01-03 | Disposition: A | Discharge status: Home / Self Care | Payer: Medicare PPO | Source: Ambulatory Visit | Attending: Internal Medicine | Admitting: Internal Medicine

## 2021-01-03 DIAGNOSIS — Z1231 Encounter for screening mammogram for malignant neoplasm of breast: Secondary | ICD-10-CM | POA: Diagnosis not present

## 2021-01-17 ENCOUNTER — Ambulatory Visit (INDEPENDENT_AMBULATORY_CARE_PROVIDER_SITE_OTHER): Payer: Medicare PPO | Admitting: Adult Health

## 2021-01-17 ENCOUNTER — Other Ambulatory Visit: Payer: Self-pay

## 2021-01-17 ENCOUNTER — Encounter: Payer: Self-pay | Admitting: Adult Health

## 2021-01-17 VITALS — BP 124/76 | HR 94 | Temp 97.3°F | Wt 196.6 lb

## 2021-01-17 DIAGNOSIS — Z0000 Encounter for general adult medical examination without abnormal findings: Secondary | ICD-10-CM

## 2021-01-17 DIAGNOSIS — Z0001 Encounter for general adult medical examination with abnormal findings: Secondary | ICD-10-CM | POA: Diagnosis not present

## 2021-01-17 DIAGNOSIS — R6889 Other general symptoms and signs: Secondary | ICD-10-CM | POA: Diagnosis not present

## 2021-01-17 DIAGNOSIS — E559 Vitamin D deficiency, unspecified: Secondary | ICD-10-CM

## 2021-01-17 DIAGNOSIS — I10 Essential (primary) hypertension: Secondary | ICD-10-CM

## 2021-01-17 DIAGNOSIS — B351 Tinea unguium: Secondary | ICD-10-CM

## 2021-01-17 DIAGNOSIS — E1169 Type 2 diabetes mellitus with other specified complication: Secondary | ICD-10-CM | POA: Diagnosis not present

## 2021-01-17 DIAGNOSIS — E1122 Type 2 diabetes mellitus with diabetic chronic kidney disease: Secondary | ICD-10-CM | POA: Diagnosis not present

## 2021-01-17 DIAGNOSIS — L659 Nonscarring hair loss, unspecified: Secondary | ICD-10-CM

## 2021-01-17 DIAGNOSIS — Z860100 Personal history of colon polyps, unspecified: Secondary | ICD-10-CM

## 2021-01-17 DIAGNOSIS — N182 Chronic kidney disease, stage 2 (mild): Secondary | ICD-10-CM

## 2021-01-17 DIAGNOSIS — E785 Hyperlipidemia, unspecified: Secondary | ICD-10-CM

## 2021-01-17 DIAGNOSIS — Z23 Encounter for immunization: Secondary | ICD-10-CM

## 2021-01-17 DIAGNOSIS — Z5320 Procedure and treatment not carried out because of patient's decision for unspecified reasons: Secondary | ICD-10-CM

## 2021-01-17 DIAGNOSIS — C4372 Malignant melanoma of left lower limb, including hip: Secondary | ICD-10-CM

## 2021-01-17 DIAGNOSIS — M1A9XX0 Chronic gout, unspecified, without tophus (tophi): Secondary | ICD-10-CM

## 2021-01-17 DIAGNOSIS — Z79899 Other long term (current) drug therapy: Secondary | ICD-10-CM

## 2021-01-17 DIAGNOSIS — M79675 Pain in left toe(s): Secondary | ICD-10-CM

## 2021-01-17 NOTE — Progress Notes (Signed)
ANNUAL MEDICARE VISIT AND FOLLOW UP  Assessment and Plan:   Annual Medicare Wellness Visit Due annually  Health maintenance reviewed Diabetes eye report requested  Type 2 diabetes mellitus with stage 2 chronic kidney disease, without long-term current use of insulin (HCC) -     Hemoglobin A1c Discussed general issues about diabetes pathophysiology and management., Educational material distributed., Suggested low cholesterol diet., Encouraged aerobic exercise., Discussed foot care., Reminded to get yearly retinal exam and forward report.   CKD stage 2 due to type 2 diabetes mellitus ([place]) Discussed general issues about diabetes pathophysiology and management., Educational material distributed., Suggested low cholesterol diet., Encouraged aerobic exercise., Discussed foot care., Reminded to get yearly retinal exam. -     Hemoglobin A1c  Hyperlipidemia associated with type 2 diabetes mellitus ([place]) Currently treated by lifestyle LDL goal <70,discussed low dose statin but declines Current guidelines and risk of cardiovascular disease reviewed She plans to make lifestyle changes - reduce saturated fat, increase soluble fiber through diet; reviewed in detail decrease fatty foods increase activity.  -     Lipid panel - defer, too soon to check   Morbid obesity ([place])- BMI 35+ with htn, hld, T2DM - follow up 3 months for progress monitoring - increase veggies, decrease carbs - long discussion about weight loss, diet, and exercise  Essential hypertension - continue medications, DASH diet, exercise and monitor at home. Call if greater than 130/80.  -     CBC with Differential/Platelet -     COMPLETE METABOLIC PANEL WITH GFR -     TSH  Gout involving toe, unspecified cause, unspecified laterality Continue allopurinol, check uric acid annually and as needed  Vitamin D deficiency Continue supplement;  Lumbar disc disease Follow up ortho PRN Weight loss advised  Alopecia of  scalp Improved, follow up derm PRN  Melanoma of left foot ([place]) S/p wide excision and skin graft - [facility] derm/plastics released Advised follow up with derm in town at minimum annually Denies concerns, doing well   Onychomycosis Discussed treatment options and foot care; she plans to try OTC topical therapies Declines prescription at this time  Toe pain Some long thickened nails,  Discussed foot care, gentle soaks then filing, wide toe shoes, toe/cushion spacers if needed.  Refer to podiatry if needed.   Need for influenza vaccine High dose quadrivalent administered without complication today    Orders Placed This Encounter  Procedures   Flu vaccine HIGH DOSE PF   COMPLETE METABOLIC PANEL WITH GFR   Hemoglobin A1c    Discussed med's effects and SE's. Screening labs and tests as requested with regular follow-up as recommended. Over 30 minutes of exam, counseling, chart review, and complex, high level critical decision making was performed this visit.   Future Appointments  Date Time Provider [place]  05/23/2021  9:00 AM Liane Comber, NP [facility] None    Plan:   During the course of the visit the patient was educated and counseled about appropriate screening and preventive services including:   Pneumococcal vaccine  Prevnar 13 Influenza vaccine Td vaccine Screening electrocardiogram Bone densitometry screening Colorectal cancer screening Diabetes screening Glaucoma screening Nutrition counseling  Advanced directives: requested   HPI  70 y.o. AA female, presents for AWV and follow up. She has Essential hypertension; Hyperlipidemia associated with type 2 diabetes mellitus ([place]); Vitamin D deficiency; Type 2 diabetes with stage 2 chronic kidney disease GFR 60-89 ([place]); Morbid obesity ([place]) - BMI 35+ with T2DM, htn, hld; Medication management; CKD stage 2  due to type 2 diabetes mellitus ([place]); Alopecia of scalp; Gout; History of colon polyps; Malignant  melanoma of left foot ([place]); and Onychomycosis on their problem list.   Divorced/widowed, 1 son, teen granddaughter, close with her niece, very active in church, works Therapist, nutritional.   At CPE in 05/2020 we noted concerning melanotic lesion to left foot, found to be malignant melanoma and under went wide excision 07/07/2020 with split thickness graft from her left thigh 07/31/2020 by Dr. Donnamarie Rossetti at [facility], reports has been released by them. Est with Dr. Jackson Latino in town but doesn't have follow up plan, encouraged to schedule.   She has been having bil toe pain, known mild onychomycosis, she is doing topical treatment OTC, declined prescription/lamisil. She gets toe nails filed by pedicurist but having more tenderness and pain along lateral borders, was concerned about possible in grown toe nail.   She had intermittent R hip pain, follows Dr. Ninfa Linden, had MRI back with L4-L5 disc bulge on the right to L5- resolved with shots and PT, no recent issues.    BMI is Body mass index is 39.04 kg/m., she has been working on weight loss. Exercise limited but trying to walk more, discussing walking group with church ladies.  Wt Readings from Last 3 Encounters:  01/17/21 196 lb 9.6 oz (89.2 kg)  11/23/20 193 lb (87.5 kg)  08/21/20 192 lb 6.4 oz (87.3 kg)   Her blood pressure has been controlled at home, today their BP is BP: 124/76 She does not workout. She denies chest pain, shortness of breath, dizziness.    She is not on cholesterol medication, currently managed by lifestyle changes. Her cholesterol is not at goal. Declines med for now. The cholesterol last visit was:   Lab Results  Component Value Date   CHOL 156 11/23/2020   HDL 62 11/23/2020   LDLCALC 80 11/23/2020   TRIG 68 11/23/2020   CHOLHDL 2.5 11/23/2020   She has been working on diet and exercise for T2DM  currently managed by lifestyle modification DEE Dr. [facility] - last 2022 - report requested  she is on bASA  she is  not on ACE/ARB denies increased appetite, nausea, paresthesia of the feet, polydipsia, polyuria, visual disturbances and vomiting.  Last A1C in the office was:  Lab Results  Component Value Date   HGBA1C 6.0 (H) 08/21/2020   Last GFR: Lab Results  Component Value Date   GFRAA 80 05/18/2020   Patient is on Vitamin D supplement, increased to taking 4000 IU daily  Lab Results  Component Value Date   VD25OH 45 11/23/2020    Patient is on allopurinol for gout and does not report a recent flare.  Lab Results  Component Value Date   LABURIC 3.4 11/23/2020     Current Medications:  Current Outpatient Medications on File Prior to Visit  Medication Sig Dispense Refill   allopurinol (ZYLOPRIM) 300 MG tablet TAKE 1 TABLET DAILY TO PREVENT GOUT 90 tablet 3   amLODipine (NORVASC) 5 MG tablet Take 1 tablet (5 mg total) by mouth daily. 90 tablet 3   aspirin EC 81 MG tablet Take  1 tablet  Daily     Cholecalciferol (VITAMIN D3) 25 MCG (1000 UT) CAPS Take 4000 IU daily. 120 capsule    COVID-19 mRNA bivalent vaccine, Pfizer, (PFIZER COVID-19 VAC BIVALENT) injection Inject into the muscle. 0.3 mL 0   diclofenac Sodium (VOLTAREN) 1 % GEL Apply 4 g topically 4 (four) times daily. 100  g 3   furosemide (LASIX) 40 MG tablet Take  1 tablet  2 x /day  for BP & Fluid Retention / Edema 180 tablet 3   Magnesium 250 MG TABS Take by mouth daily.     No current facility-administered medications on file prior to visit.   Allergies:  Allergies  Allergen Reactions   Ziac [Bisoprolol-Hydrochlorothiazide]     Flushing    Medical History:  She has Essential hypertension; Hyperlipidemia associated with type 2 diabetes mellitus ([place]); Vitamin D deficiency; Type 2 diabetes with stage 2 chronic kidney disease GFR 60-89 ([place]); Morbid obesity ([place]) - BMI 35+ with T2DM, htn, hld; Medication management; CKD stage 2 due to type 2 diabetes mellitus ([place]); Alopecia of scalp; Gout; History of colon polyps; Malignant  melanoma of left foot ([place]); and Onychomycosis on their problem list.   Health Maintenance:   Immunization History  Administered Date(s) Administered   Influenza, High Dose Seasonal PF 05/04/2018, 01/17/2021   PFIZER(Purple Top)SARS-COV-2 Vaccination 01/31/2019, 02/27/2019, 12/25/2019   PPD Test 04/26/2013   Pfizer Covid-19 Vaccine Bivalent Booster 49yrs & up 11/24/2020   Td 05/18/2020   Preventative care: Last colonoscopy: 09/2014, benign polyps, 10 year follow up, Dr. Collene Mares EGD: 09/2014 Dr. Collene Mares Last mammogram: 01/03/2021 Last pap smear/pelvic exam: 2016 declines another DEXA: 2013   Prior vaccinations: TD or Tdap: 05/18/2020  Influenza: 2020, TODAY Pneumococcal: declines Prevnar13: declines Shingles/Zostavax: declines COVID 19: 2/2+ last 12/24/2020  Names of Other Physician/Practitioners you currently use: 1. [facility] here for primary care 2. Dr. Truman Hayward, eye doctor, last visit 2022 -report requested 3. Dr. Sallee Lange, dentist, last visit 07/2020, q24m  Patient Care Team: Unk Pinto, MD as PCP - General (Internal Medicine) Juanita Craver, MD as Consulting Physician (Gastroenterology)  Surgical History:  She has a past surgical history that includes Cesarean section (01/07/1969); Knee arthroscopy (Right); Carpal tunnel release (Right); Tubal ligation; Skin graft split thickness leg / foot (Left, 07/31/2020); and Skin surgery (Left, 07/07/2020). Family History:  Herfamily history includes Aneurysm in her sister; COPD in her brother; Diabetes in her brother and brother; Heart disease in her brother and brother; Hypertension in her sister; Stroke in her mother; Thyroid disease in her sister. Social History:  She reports that she has never smoked. She has never used smokeless tobacco. She reports that she does not currently use alcohol. She reports that she does not use drugs.  Review of Systems: Review of Systems  Constitutional:   Negative for malaise/fatigue and weight loss.  HENT:  Negative for hearing loss and tinnitus.   Eyes:  Negative for blurred vision and double vision.  Respiratory:  Negative for cough, shortness of breath and wheezing.   Cardiovascular:  Negative for chest pain, palpitations, orthopnea, claudication and leg swelling.  Gastrointestinal:  Negative for abdominal pain, blood in stool, constipation, diarrhea, heartburn, melena, nausea and vomiting.  Genitourinary: Negative.   Musculoskeletal:  Negative for joint pain and myalgias.  Skin:  Negative for rash.  Neurological:  Negative for dizziness, tingling, sensory change, weakness and headaches.  Endo/Heme/Allergies:  Negative for polydipsia.  Psychiatric/Behavioral: Negative.  Negative for depression. The patient is not nervous/anxious and does not have insomnia.   All other systems reviewed and are negative.  Physical Exam: Estimated body mass index is 39.04 kg/m as calculated from the following:   Height as of 11/23/20: 4' 11.5" (1.511 m).   Weight as of this encounter: 196 lb 9.6 oz (89.2 kg). BP 124/76  Pulse 94    Temp (!) 97.3 F (36.3 C)    Wt 196 lb 9.6 oz (89.2 kg)    SpO2 99%    BMI 39.04 kg/m  General Appearance: Well nourished, in no apparent distress.  Eyes: PERRLA, EOMs, conjunctiva no swelling or erythema Sinuses: No Frontal/maxillary tenderness  ENT/Mouth: Ext aud canals clear, normal light reflex with TMs without erythema, bulging. Good dentition. No erythema, swelling, or exudate on post pharynx. Tonsils not swollen or erythematous. Hearing normal.  Neck: Supple, thyroid normal. No bruits  Respiratory: Respiratory effort normal, BS equal bilaterally without rales, rhonchi, wheezing or stridor.  Cardio: RRR without murmurs, rubs or gallops. Brisk peripheral pulses without edema.  Chest: symmetric, with normal excursions and percussion.  Abdomen: Soft, nontender, no guarding, rebound, hernias, masses, or organomegaly.   Lymphatics: Non tender without lymphadenopathy.  Musculoskeletal: Full ROM all peripheral extremities, 5/5 strength.  Skin: Warm, dry without rashes, ecchymosis. Well healing melanoma graft to left lateral heel. She has thickened toenails distally, bil feet, 1st and 2nd digits, long and 2nd digit curving towards 1st, erythema/discharge or ingrown nail. Neuro: Cranial nerves intact, reflexes equal bilaterally. Normal muscle tone, no cerebellar symptoms. Sensation intact.  Psych: Awake and oriented X 3, normal affect, Insight and Judgment appropriate.    Medicare Attestation I have personally reviewed: The patient's medical and social history Their use of alcohol, tobacco or illicit drugs Their current medications and supplements The patient's functional ability including ADLs,fall risks, home safety risks, cognitive, and hearing and visual impairment Diet and physical activities Evidence for depression or mood disorders  The patient's weight, height, BMI, and visual acuity have been recorded in the chart.  I have made referrals, counseling, and provided education to the patient based on review of the above and I have provided the patient with a written personalized care plan for preventive services.    Izora Ribas, NP 1:05 PM [facility]

## 2021-01-17 NOTE — Patient Instructions (Signed)
Foot Care, Adult Foot care is an important part of your health. Finding and addressing potential problems early is the best way to prevent future foot problems. Older adults and those with chronic health conditions, such as diabetes, are more likely to develop foot problems. Supplies needed: Warm water. Mild soap. Clean towel. Nail clippers. [place] [employer] board or [employer]. Unscented moisturizing lotion or petroleum jelly. Mirror. How to care for your feet Foot hygiene Wash your feet daily with warm (not hot) water and mild soap. Then use a clean towel to dry your feet and the areas between your toes. Do not soak your feet as this can dry your skin. Clip your toenails straight across. They are easier to cut after bathing when nails are slightly softer. Do not dig under them or around the cuticle. File the edges of your nails with an emery board or nail file. Apply unscented moisturizing lotion or petroleum jelly without alcohol to your feet and dry toenails. Do not apply lotion between your toes. Shoes and socks Wear clean socks or stockings every day. Make sure they are not too tight. Wear shoes that fit and have enough cushioning. To break in new shoes, wear them for a few hours a day. This prevents you from injuring your feet. Always look in your shoes before putting them on to be sure there are no objects inside. Wounds, scrapes, corns, and calluses  Check your feet daily for blisters, cuts, and redness. If you cannot see the bottom of your feet, use a mirror or ask someone for help. Do not cut corns or calluses. Do not try to remove them with medicine. If you find a minor scrape, cut, or break in the skin on your feet, keep the area clean and dry. These areas may be cleaned with mild soap and water. Do not clean the area with peroxide, alcohol, or iodine. If you have a wound, scrape, corn, or callus on your foot, look at it several times a day to make sure it is healing and not infected. Check  for: More redness, swelling, or pain. Fluid or blood. Warmth. Pus or a bad smell. General instructions Do not cross your legs. That may decrease the blood flow to your feet. Do not use heating pads or hot water bottles on your feet. They may burn your skin. If you have lost feeling in your feet or legs, you may not know it is happening until it is too late. Make sure your health care provider does a complete foot exam at least once a year, or more often if you have foot problems. If you have foot problems, report any cuts, sores, or bruises to your health care provider right away. Where to find more information Centers for Disease Control and Prevention: http://www.wolf.info/ Contact a health care provider if: You have a medical condition that increases your risk of infection and you have any cuts, sores, or bruises on your feet. You have an injury that is not healing. You notice redness on your legs or feet. You feel burning or tingling in your legs or feet. You have pain or cramps in your legs or feet. Your legs or feet are numb. Your feet always feel cold. You have pain around a toenail. Get help right away if: You have a wound, scrape, corn, or callus on your foot and: You have more redness, swelling, or pain. You have fluid or blood. Your wound, scrape, corn, or callus feels warm to the touch. You have  pus or a bad smell coming from the wound, scrape, corn, or callus. You have a fever. You have a red line going up your leg. Summary Foot care is an important part of your health. Finding and addressing potential problems early is the best way to prevent future foot problems. Wash your feet daily with warm (not hot) water and mild soap. Then, use a clean towel to dry your feet and the areas between your toes. Do not soak your feet as this can dry your skin. Clip your toenails straight across. Wear clean socks or stockings every day. Check your feet daily for blisters, cuts, and redness. If  you cannot see the bottom of your feet, use a mirror or ask someone for help. This information is not intended to replace advice given to you by your health care provider. Make sure you discuss any questions you have with your health care provider. Document Revised: 11/10/2018 Document Reviewed: 11/10/2018 Elsevier Patient Education  [place].

## 2021-01-18 LAB — COMPLETE METABOLIC PANEL WITH GFR
AG Ratio: 1.6 (calc) (ref 1.0–2.5)
ALT: <3 U/L — ABNORMAL LOW (ref 6–29)
AST: 13 U/L (ref 10–35)
Albumin: 4 g/dL (ref 3.6–5.1)
Alkaline phosphatase (APISO): 95 U/L (ref 37–153)
BUN: 10 mg/dL (ref 7–25)
CO2: 30 mmol/L (ref 20–32)
Calcium: 9.7 mg/dL (ref 8.6–10.4)
Chloride: 105 mmol/L (ref 98–110)
Creat: 0.81 mg/dL (ref 0.60–1.00)
Globulin: 2.5 g/dL (calc) (ref 1.9–3.7)
Glucose, Bld: 89 mg/dL (ref 65–99)
Potassium: 4.6 mmol/L (ref 3.5–5.3)
Sodium: 139 mmol/L (ref 135–146)
Total Bilirubin: 0.5 mg/dL (ref 0.2–1.2)
Total Protein: 6.5 g/dL (ref 6.1–8.1)
eGFR: 78 mL/min/{1.73_m2} (ref >=60)

## 2021-01-18 LAB — HEMOGLOBIN A1C
Hgb A1c MFr Bld: 6.1 % of total Hgb — ABNORMAL HIGH (ref <5.7)
Mean Plasma Glucose: 128 mg/dL
eAG (mmol/L): 7.1 mmol/L

## 2021-03-07 ENCOUNTER — Other Ambulatory Visit: Payer: Self-pay

## 2021-03-07 ENCOUNTER — Ambulatory Visit (INDEPENDENT_AMBULATORY_CARE_PROVIDER_SITE_OTHER): Payer: Medicare PPO | Admitting: Adult Health

## 2021-03-07 ENCOUNTER — Encounter: Payer: Self-pay | Admitting: Adult Health

## 2021-03-07 VITALS — BP 124/80 | HR 88 | Temp 97.7°F | Wt 194.0 lb

## 2021-03-07 DIAGNOSIS — R0789 Other chest pain: Secondary | ICD-10-CM | POA: Diagnosis not present

## 2021-03-07 DIAGNOSIS — R079 Chest pain, unspecified: Secondary | ICD-10-CM

## 2021-03-07 NOTE — Patient Instructions (Signed)
Costochondritis  Costochondritis is inflammation of the tissue (cartilage) that connects the ribs to the breastbone (sternum). This causes pain in the front of the chest. The pain usually starts slowlyand involves more than one rib. What are the causes? The exact cause of this condition is not always known. It results from stress on the cartilage where your ribs attach to your sternum. The cause of this stress could be: Chest injury. Exercise or activity, such as lifting. Severe coughing. What increases the risk? You are more likely to develop this condition if you: Are female. Are 30-40 years old. Recently started a new exercise or work activity. Have low levels of vitamin D. Have a condition that makes you cough frequently. What are the signs or symptoms? The main symptom of this condition is chest pain. The pain: Usually starts gradually and can be sharp or dull. Gets worse with deep breathing, coughing, or exercise. Gets better with rest. May be worse when you press on the affected area of your ribs and sternum. How is this diagnosed? This condition is diagnosed based on your symptoms, your medical history, and a physical exam. Your health care provider will check for pain when pressing on your sternum. You may also have tests to rule out other causes of chest pain. These may include: A chest X-ray to check for lung problems. An ECG (electrocardiogram) to see if you have a heart problem that could be causing the pain. An imaging scan to rule out a chest or rib fracture. How is this treated? This condition usually goes away on its own over time. Your health care provider may prescribe an NSAID, such as ibuprofen, to reduce pain and inflammation. Treatment may also include: Resting and avoiding activities that make pain worse. Applying heat or ice to the area to reduce pain and inflammation. Doing exercises to stretch your chest muscles. If these treatments do not help, your health  care provider may inject a numbingmedicine at the sternum-rib connection to help relieve the pain. Follow these instructions at home: Managing pain, stiffness, and swelling     If directed, put ice on the painful area. To do this: Put ice in a plastic bag. Place a towel between your skin and the bag. Leave the ice on for 20 minutes, 2-3 times a day. If directed, apply heat to the affected area as often as told by your health care provider. Use the heat source that your health care provider recommends, such as a moist heat pack or a heating pad. Place a towel between your skin and the heat source. Leave the heat on for 20-30 minutes. Remove the heat if your skin turns bright red. This is especially important if you are unable to feel pain, heat, or cold. You may have a greater risk of getting burned. Activity Rest as told by your health care provider. Avoid activities that make pain worse. This includes any activities that use chest, abdominal, and side muscles. Do not lift anything that is heavier than 10 lb (4.5 kg), or the limit that you are told, until your health care provider says that it is safe. Return to your normal activities as told by your health care provider. Ask your health care provider what activities are safe for you. General instructions Take over-the-counter and prescription medicines only as told by your health care provider. Keep all follow-up visits as told by your health care provider. This is important. Contact a health care provider if: You have chills or a   fever. Your pain does not go away or it gets worse. You have a cough that does not go away. Get help right away if: You have shortness of breath. You have severe chest pain that is not relieved by medicines, heat, or ice. These symptoms may represent a serious problem that is an emergency. Do not wait to see if the symptoms will go away. Get medical help right away. Call your local emergency services (911 in  the [place] Do not drive yourself to the hospital. Summary Costochondritis is inflammation of the tissue (cartilage) that connects the ribs to the breastbone (sternum). This condition causes pain in the front of the chest. Costochondritis results from stress on the cartilage where your ribs attach to your sternum. Treatment may include medicines, rest, heat or ice, and exercises. This information is not intended to replace advice given to you by your health care provider. Make sure you discuss any questions you have with your healthcare provider. Document Revised: 11/06/2018 Document Reviewed: 11/06/2018 Elsevier Patient Education  2022 [employer] Inc.  

## 2021-03-07 NOTE — Progress Notes (Signed)
Assessment and Plan: ? ?Brittney Gray was seen today for acute visit. ? ?Diagnoses and all orders for this visit: ? ?Costochondral chest pain ?+ tenderness to palpations on chest, non exertional, with no accompaniments- Essentially resolved - can continue heat/ice/biofreeze, could do aspercreme/voltaren if needed ?Go to the ER if any CP, SOB, nausea, dizziness, severe HA, changes vision/speech ? ?Chest pain, unspecified type ?Benign; educated on sx of cardiac related chest pain in females/t2DM may not be typical. Seek evaluation for any atypical fatigue, dyspnea, edema, exercise intolerance, extremity/neck/jaw pain.  ?-     EKG 12-Lead ? ?Further disposition pending results of labs. Discussed med's effects and SE's.   ?Over 20 minutes of exam, counseling, chart review, and critical decision making was performed.  ? ?Future Appointments  ?Date Time Provider [place]  ?05/23/2021  9:00 AM Liane Comber, NP [facility] None  ? ? ?------------------------------------------------------------------------------------------------------------------ ? ? ?HPI ?BP 124/80   Pulse 88   Temp 97.7 ?F (36.5 ?C)   Wt 194 lb (88 kg)   SpO2 99%   BMI 38.53 kg/m?  ?71 y.o.female with htn, hld, T2DM presents for evaluation of variable chest pain that started 4 days ago. She had EKG prior to being seen by provider, NSR, No ST changes.  ? ?She reports 4 days ago started having R sided chest pain, would move around, denies chest tightness of pressure, dyspnea. DOES endorse had some chest wall tenderness at that time. Denies atypical activities, lifting, falls prior to onset. Deep breaths would also trigger. Pain was moving around over the weekend, also left chest and back, but much improved last 1-2 days, essentially resolved today. Biofreeze and applying heat seemed to help.  ? ?Past Medical History:  ?Diagnosis Date  ? Hypertension   ? Lumbar disc disease 08/31/2018  ? Melanoma ([name]) 2022  ? Obesity   ? Pre-diabetes   ? Vitamin D  deficiency   ?  ? ?Allergies  ?Allergen Reactions  ? Ziac [Bisoprolol-Hydrochlorothiazide]   ?  Flushing ?  ? ? ?Current Outpatient Medications on File Prior to Visit  ?Medication Sig  ? allopurinol (ZYLOPRIM) 300 MG tablet TAKE 1 TABLET DAILY TO PREVENT GOUT  ? amLODipine (NORVASC) 5 MG tablet Take 1 tablet (5 mg total) by mouth daily.  ? aspirin EC 81 MG tablet Take  1 tablet  Daily  ? Cholecalciferol (VITAMIN D3) 25 MCG (1000 UT) CAPS Take 4000 IU daily.  ? diclofenac Sodium (VOLTAREN) 1 % GEL Apply 4 g topically 4 (four) times daily.  ? furosemide (LASIX) 40 MG tablet Take  1 tablet  2 x /day  for BP & Fluid Retention / Edema  ? Magnesium 250 MG TABS Take by mouth daily.  ? COVID-19 mRNA bivalent vaccine, Pfizer, (PFIZER COVID-19 VAC BIVALENT) injection Inject into the muscle.  ? ?No current facility-administered medications on file prior to visit.  ? ? ?ROS: all negative except above.  ? ?Physical Exam: ? ?BP 124/80   Pulse 88   Temp 97.7 ?F (36.5 ?C)   Wt 194 lb (88 kg)   SpO2 99%   BMI 38.53 kg/m?  ? ?General Appearance: Well nourished, well dressed obese AA elder female in no apparent distress. ?Eyes: PERRLA, conjunctiva no swelling or erythema ?ENT/Mouth: mask in place; Hearing normal.  ?Neck: Supple, thyroid normal.  ?Respiratory: Respiratory effort normal, BS equal bilaterally without rales, rhonchi, wheezing or stridor.  ?Cardio: RRR with no MRGs. Brisk peripheral pulses without edema.  ?Abdomen: Soft, + BS.  Non tender,  no guarding, rebound, hernias, masses. ?Lymphatics: Non tender without lymphadenopathy.  ?Musculoskeletal: no obvious deformity; normal gait. + anterior chest wall tenderness over R sternal border.  ?Skin: Warm, dry without rashes, lesions, ecchymosis.  ?Neuro: Normal muscle tone ?Psych: Awake and oriented X 3, normal affect, Insight and Judgment appropriate.  ? ?Izora Ribas, NP ?3:25 PM ?[facility] ? ?

## 2021-04-09 ENCOUNTER — Other Ambulatory Visit: Payer: Self-pay | Admitting: Internal Medicine

## 2021-04-09 DIAGNOSIS — I10 Essential (primary) hypertension: Secondary | ICD-10-CM

## 2021-04-17 ENCOUNTER — Other Ambulatory Visit: Payer: Self-pay | Admitting: Internal Medicine

## 2021-04-17 DIAGNOSIS — M109 Gout, unspecified: Secondary | ICD-10-CM

## 2021-04-19 ENCOUNTER — Ambulatory Visit (INDEPENDENT_AMBULATORY_CARE_PROVIDER_SITE_OTHER): Payer: Medicare PPO | Admitting: Adult Health

## 2021-04-19 ENCOUNTER — Ambulatory Visit
Admission: RE | Admit: 2021-04-19 | Discharge: 2021-04-19 | Disposition: A | Discharge status: Home / Self Care | Payer: Medicare PPO | Source: Ambulatory Visit | Attending: Adult Health | Admitting: Adult Health

## 2021-04-19 ENCOUNTER — Encounter: Payer: Self-pay | Admitting: Adult Health

## 2021-04-19 VITALS — BP 130/72 | HR 96 | Temp 97.9°F | Wt 195.0 lb

## 2021-04-19 DIAGNOSIS — M79641 Pain in right hand: Secondary | ICD-10-CM

## 2021-04-19 DIAGNOSIS — M65341 Trigger finger, right ring finger: Secondary | ICD-10-CM | POA: Diagnosis not present

## 2021-04-19 DIAGNOSIS — M25841 Other specified joint disorders, right hand: Secondary | ICD-10-CM

## 2021-04-19 NOTE — Patient Instructions (Signed)
? ?To get your xray:  ?Please get your xray at [address] [facility], can walk in without an appointment M-F, 8-4pm ? ? ? ? ? ?Trigger Finger ?Trigger finger, also called stenosing tenosynovitis,  is a condition that causes a finger to get stuck in a bent position. Each finger has a tendon, which is a tough, cord-like tissue that connects muscle to bone, and each tendon passes through a tunnel of tissue called a tendon sheath. ?To move your finger, your tendon needs to glide freely through the sheath. Trigger finger happens when the tendon or the sheath thickens, making it difficult to move your finger. Trigger finger can affect any finger or a thumb. It may affect more than one finger. Mild cases may clear up with rest and medicine. Severe cases require more treatment. ?What are the causes? ?Trigger finger is caused by a thickened finger tendon or tendon sheath. The cause of this thickening is not known. ?What increases the risk? ?The following factors may make you more likely to develop this condition: ?Doing activities that require a strong grip. ?Having rheumatoid arthritis, gout, or diabetes. ?Being 56-49 years old. ?Being female. ?What are the signs or symptoms? ?Symptoms of this condition include: ?Pain when bending or straightening your finger. ?Tenderness or swelling where your finger attaches to the palm of your hand. ?A lump in the palm of your hand or on the inside of your finger. ?Hearing a noise like a pop or a snap when you try to straighten your finger. ?Feeling a catching or locking sensation when you try to straighten your finger. ?Being unable to straighten your finger. ?How is this diagnosed? ?This condition is diagnosed based on your symptoms and a physical exam. ?How is this treated? ?This condition may be treated by: ?Resting your finger and avoiding activities that make symptoms worse. ?Wearing a finger splint to keep your finger extended. ?Taking NSAIDs, such as ibuprofen, to  relieve pain and swelling. ?Doing gentle exercises to stretch the finger as told by your health care provider. ?Having medicine that reduces swelling and inflammation (steroids) injected into the tendon sheath. Injections may need to be repeated. ?Having surgery to open the tendon sheath. This may be done if other treatments do not work and you cannot straighten your finger. You may need physical therapy after surgery. ?Follow these instructions at home: ?If you have a splint: ?Wear the splint as told by your health care provider. Remove it only as told by your health care provider. ?Loosen it if your fingers tingle, become numb, or turn cold and blue. ?Keep it clean. ?If the splint is not waterproof: ?Do not let it get wet. ?Cover it with a watertight covering when you take a bath or shower. ?Managing pain, stiffness, and swelling ?  ?If directed, apply heat to the affected area as often as told by your health care provider. Use the heat source that your health care provider recommends, such as a moist heat pack or a heating pad. ?Place a towel between your skin and the heat source. ?Leave the heat on for 20-30 minutes. ?Remove the heat if your skin turns bright red. This is especially important if you are unable to feel pain, heat, or cold. You may have a greater risk of getting burned. ?If directed, put ice on the painful area. To do this: ?If you have a removable splint, remove it as told by your health care provider. ?Put ice in a plastic bag. ?Place a towel  between your skin and the bag or between your splint and the bag. ?Leave the ice on for 20 minutes, 2-3 times a day. ? ?Activity ?Rest your finger as told by your health care provider. Avoid activities that make the pain worse. ?Return to your normal activities as told by your health care provider. Ask your health care provider what activities are safe for you. ?Do exercises as told by your health care provider. ?Ask your health care provider when it is  safe to drive if you have a splint on your hand. ?General instructions ?Take over-the-counter and prescription medicines only as told by your health care provider. ?Keep all follow-up visits as told by your health care provider. This is important. ?Contact a health care provider if: ?Your symptoms are not improving with home care. ?Summary ?Trigger finger, also called stenosing tenosynovitis, causes your finger to get stuck in a bent position. This can make it difficult and painful to straighten your finger. ?This condition develops when a finger tendon or tendon sheath thickens. ?Treatment may include resting your finger, wearing a splint, and taking medicines. ?In severe cases, surgery to open the tendon sheath may be needed. ?This information is not intended to replace advice given to you by your health care provider. Make sure you discuss any questions you have with your health care provider. ?Document Revised: 05/11/2018 Document Reviewed: 05/11/2018 ?Elsevier Patient Education ? 2022 [place]. ? ? ?

## 2021-04-19 NOTE — Progress Notes (Signed)
Assessment and Plan: ? ?Brittney Gray was seen today for hand problem. ? ?Diagnoses and all orders for this visit: ? ?Right hand pain ?Suspect arthritis 1st digit MCP, 4th MCP with bony palmar enlargement, firmer than expected, arthritis vs ? bony mass, will check xray after discussion.  ?-     DG Hand Complete Right; Future ? ?Trigger ring finger of right hand ?Check xray  ?Patient preference for monitoring and conservative intervention ?Recommend voltaren increased to QID and gentle stretching ?Consider hand specialist referral if persistent/progressive  ? ?Mass of joint of right hand ?-     DG Hand Complete Right; Future ? ?Further disposition pending results of labs. Discussed med's effects and SE's.   ?Over 20 minutes of exam, counseling, chart review, and critical decision making was performed.  ? ?Future Appointments  ?Date Time Provider [place]  ?05/23/2021  9:00 AM Liane Comber, NP [facility] None  ? ? ?------------------------------------------------------------------------------------------------------------------ ? ? ?HPI ?BP 130/72   Pulse 96   Temp 97.9 ?F (36.6 ?C)   Wt 195 lb (88.5 kg)   SpO2 99%   BMI 38.73 kg/m?  ?71 y.o.female R handed, presents for 2 months of R hand pain, bil thumb pain, intermittent locking of R hand 4th digit and R palmar palpable lump.  ? ?Denies hx of similar; has been using voltaren gel once daily at night.  ? ?Past Medical History:  ?Diagnosis Date  ? Hypertension   ? Lumbar disc disease 08/31/2018  ? Melanoma ([place]) 2022  ? Obesity   ? Pre-diabetes   ? Vitamin D deficiency   ?  ? ?Allergies  ?Allergen Reactions  ? Ziac [Bisoprolol-Hydrochlorothiazide]   ?  Flushing ?  ? ? ?Current Outpatient Medications on File Prior to Visit  ?Medication Sig  ? allopurinol (ZYLOPRIM) 300 MG tablet TAKE 1 TABLET DAILY TO PREVENT GOUT  ? amLODipine (NORVASC) 5 MG tablet Take 1 tablet (5 mg total) by mouth daily.  ? amLODipine (NORVASC) 5 MG tablet TAKE 1 TABLET BY MOUTH EVERY DAY  FOR BLOOD PRESSURE  ? aspirin EC 81 MG tablet Take  1 tablet  Daily  ? Cholecalciferol (VITAMIN D3) 25 MCG (1000 UT) CAPS Take 4000 IU daily.  ? diclofenac Sodium (VOLTAREN) 1 % GEL Apply 4 g topically 4 (four) times daily.  ? furosemide (LASIX) 40 MG tablet Take  1 tablet  2 x /day  for BP & Fluid Retention / Edema  ? Magnesium 250 MG TABS Take by mouth daily.  ? COVID-19 mRNA bivalent vaccine, Pfizer, (PFIZER COVID-19 VAC BIVALENT) injection Inject into the muscle.  ? ?No current facility-administered medications on file prior to visit.  ? ? ?ROS: all negative except above.  ? ?Physical Exam: ? ?BP 130/72   Pulse 96   Temp 97.9 ?F (36.6 ?C)   Wt 195 lb (88.5 kg)   SpO2 99%   BMI 38.73 kg/m?  ? ?General Appearance: Well nourished, in no apparent distress. ?Eyes: PERRL, conjunctiva no swelling or erythema ?ENT/Mouth: mask in place; Hearing normal.  ?Neck: Supple ?Respiratory: Respiratory effort normal, BS equal bilaterally without rales, rhonchi, wheezing or stridor.  ?Cardio: Brisk peripheral pulses without edema.  ?Musculoskeletal: normal gait; R hand 4th MCP area palmar aspect with smooth very hard enlargement, no other notable bony enlargement, ROM intact, no erythema, tenderness, effusion  ?Skin: Warm, dry without rashes, lesions, ecchymosis.  ?Neuro: Normal muscle tone. R hand sensation intact.  ?Psych: Awake and oriented X 3, normal affect, Insight and Judgment appropriate.  ? ?  Izora Ribas, NP ?2:29 PM ?[facility] ? ?

## 2021-04-20 ENCOUNTER — Encounter: Payer: Self-pay | Admitting: Adult Health

## 2021-04-20 DIAGNOSIS — M1810 Unilateral primary osteoarthritis of first carpometacarpal joint, unspecified hand: Secondary | ICD-10-CM | POA: Insufficient documentation

## 2021-05-09 ENCOUNTER — Other Ambulatory Visit: Payer: Self-pay | Admitting: Adult Health

## 2021-05-09 ENCOUNTER — Telehealth: Payer: Self-pay | Admitting: Adult Health

## 2021-05-09 DIAGNOSIS — M65341 Trigger finger, right ring finger: Secondary | ICD-10-CM

## 2021-05-09 DIAGNOSIS — M1811 Unilateral primary osteoarthritis of first carpometacarpal joint, right hand: Secondary | ICD-10-CM

## 2021-05-09 NOTE — Telephone Encounter (Signed)
Pain in her right hand hasn't resolved. Patient states that she was told to call us back if symptoms didn't improve and you would send her to a specialist to have injection done. She would like to move forward with a ref.  ?

## 2021-05-21 ENCOUNTER — Encounter: Payer: Medicare PPO | Admitting: Adult Health

## 2021-05-23 ENCOUNTER — Encounter: Payer: Medicare PPO | Admitting: Adult Health

## 2021-05-29 ENCOUNTER — Ambulatory Visit: Payer: Medicare PPO | Admitting: Nurse Practitioner

## 2021-05-29 ENCOUNTER — Encounter: Payer: Self-pay | Admitting: Nurse Practitioner

## 2021-05-29 VITALS — BP 130/80 | HR 80 | Temp 97.5°F | Ht 59.5 in | Wt 195.0 lb

## 2021-05-29 DIAGNOSIS — E559 Vitamin D deficiency, unspecified: Secondary | ICD-10-CM

## 2021-05-29 DIAGNOSIS — Z79899 Other long term (current) drug therapy: Secondary | ICD-10-CM

## 2021-05-29 DIAGNOSIS — E1122 Type 2 diabetes mellitus with diabetic chronic kidney disease: Secondary | ICD-10-CM | POA: Diagnosis not present

## 2021-05-29 DIAGNOSIS — Z0001 Encounter for general adult medical examination with abnormal findings: Secondary | ICD-10-CM

## 2021-05-29 DIAGNOSIS — M1A9XX0 Chronic gout, unspecified, without tophus (tophi): Secondary | ICD-10-CM

## 2021-05-29 DIAGNOSIS — E785 Hyperlipidemia, unspecified: Secondary | ICD-10-CM | POA: Diagnosis not present

## 2021-05-29 DIAGNOSIS — C4372 Malignant melanoma of left lower limb, including hip: Secondary | ICD-10-CM

## 2021-05-29 DIAGNOSIS — Z86010 Personal history of colonic polyps: Secondary | ICD-10-CM

## 2021-05-29 DIAGNOSIS — E1169 Type 2 diabetes mellitus with other specified complication: Secondary | ICD-10-CM

## 2021-05-29 DIAGNOSIS — Z0000 Encounter for general adult medical examination without abnormal findings: Secondary | ICD-10-CM | POA: Diagnosis not present

## 2021-05-29 DIAGNOSIS — I10 Essential (primary) hypertension: Secondary | ICD-10-CM

## 2021-05-29 DIAGNOSIS — M65341 Trigger finger, right ring finger: Secondary | ICD-10-CM

## 2021-05-29 DIAGNOSIS — B351 Tinea unguium: Secondary | ICD-10-CM

## 2021-05-29 DIAGNOSIS — N182 Chronic kidney disease, stage 2 (mild): Secondary | ICD-10-CM | POA: Diagnosis not present

## 2021-05-29 DIAGNOSIS — L659 Nonscarring hair loss, unspecified: Secondary | ICD-10-CM

## 2021-05-29 NOTE — Progress Notes (Signed)
CPE  Assessment and Plan:  1. Encounter for general adult medical examination with abnormal findings Due Annually  - CBC with Differential/Platelet - COMPLETE METABOLIC PANEL WITH GFR - Magnesium - Lipid panel - TSH - Hemoglobin A1c - VITAMIN D 25 Hydroxy (Vit-D Deficiency, Fractures) - Uric acid  2. Type 2 diabetes mellitus with stage 2 chronic kidney disease, without long-term current use of insulin ([place]). Continue lifestyle modifications. Continue low carb, low sugar diet. Remain active, aerobic exercise.,  Inspect feet daily.   Discussed yearly retinal exam.  - CBC with Differential/Platelet - COMPLETE METABOLIC PANEL WITH GFR - Hemoglobin A1c  3. CKD stage 2 due to type 2 diabetes mellitus (HCC) Avoid NSAIDS, nephrotoxic foods. Limit salt intake  - CBC with Differential/Platelet - COMPLETE METABOLIC PANEL WITH GFR - Hemoglobin A1c  4. Hyperlipidemia associated with type 2 diabetes mellitus ([place]) At goal. Continue lifestyle medications. Stay active. Monitor weight.    - Lipid panel  5. Morbid obesity ([name]) - BMI 35+ with T2DM, htn, hld Continue to remain active Discussed diet rich in fruits and vegetables. Discussed portion control.  - TSH  6. Essential hypertension Controlled. Continue Amlodipine, Furosemide (PRN). Discussed DASH diet Remain active, exercise  Continue to monitor.  7. Chronic gout involving toe without tophus, unspecified cause, unspecified laterality Stable.   No recent flares Discussed low purine diet  - Uric acid  8. Vitamin D deficiency At goal. Continue supplement Continue to monitor  - VITAMIN D 25 Hydroxy (Vit-D Deficiency, Fractures)  9. Medication management All medications discussed and reviewed in detail. All questions and concerns addressed.  - CBC with Differential/Platelet - COMPLETE METABOLIC PANEL WITH GFR - Magnesium - Lipid panel - TSH - Hemoglobin A1c - VITAMIN D 25 Hydroxy (Vit-D Deficiency,  Fractures) - Uric acid  10. Alopecia of scalp Continue to follow with Dermatology.  11. Malignant melanoma of left foot ([place]) Suggested annual skin checks with Dermatology Patient to schedule updated apt.  12. Onychomycosis Continue to  monitor.   13. Trigger ring finger of right hand Re-refer to [facility] per patient request  -Ambulatory referral to Orthopedics.  14. History of colon polyps UTD on Colonoscopy - Due 2016 Dr. Collene Mares No new recent events Continue to  monitor   Discussed med's effects and SE's. Screening labs and tests as requested with regular follow-up as recommended. Over 30 minutes of exam, counseling, chart review, and complex, high level critical decision making was performed this visit.   Future Appointments  Date Time Provider [place]  05/30/2022 10:00 AM Darrol Jump, NP [facility] None     HPI  71 y.o. AA female, presents for a cpe and follow up. She has Essential hypertension; Hyperlipidemia associated with type 2 diabetes mellitus ([place]); Vitamin D deficiency; Type 2 diabetes with stage 2 chronic kidney disease GFR 60-89 ([place]); Morbid obesity ([place]) - BMI 35+ with T2DM, htn, hld; Medication management; CKD stage 2 due to type 2 diabetes mellitus ([place]); Alopecia of scalp; Gout; History of colon polyps; Malignant melanoma of left foot ([place]); Onychomycosis; Trigger ring finger of right hand; and Degenerative arthritis of thumb on their problem list.   She is divorced/widowed, 60 son, 61 year old granddaughter, very active in church, works Therapist, nutritional.   She had intermittent R hip pain, follows Dr. Ninfa Linden, had MRI back with L4-L5 disc bulge on the right to L5- has had several shots, reports has since resolved, no pain in several months.   Has hx of left foot  melanoma.  Has not followed with Dermatology after being cleared.  Does not get yearly skin checks.    Continues to report right 4th finger trigger finger with small nodule palm  side.  She has been refereed to a hand specialist but no longer want to continue that referral.  She is requested to be referred to [facility] for further evaluation.   BMI is Body mass index is 38.73 kg/m., she has been working on weight loss. She has been moving more and got a bicycle at home. Wt Readings from Last 3 Encounters:  05/29/21 195 lb (88.5 kg)  04/19/21 195 lb (88.5 kg)  03/07/21 194 lb (88 kg)   Her blood pressure has been controlled at home, today their BP is BP: 130/80 She does not workout. She denies chest pain, shortness of breath, dizziness.  Takes amlodipine 5 mg, recently switched from HCTZ to lasix due to edema, improved.   She is not on cholesterol medication, currently managed by lifestyle changes. Her cholesterol is at goal. The cholesterol last visit was:   Lab Results  Component Value Date   CHOL 156 11/23/2020   HDL 62 11/23/2020   LDLCALC 80 11/23/2020   TRIG 68 11/23/2020   CHOLHDL 2.5 11/23/2020   She has been working on diet and exercise for T2DM  currently managed by lifestyle modification she is on bASA  she is not on ACE/ARB denies increased appetite, nausea, paresthesia of the feet, polydipsia, polyuria, visual disturbances and vomiting.  Last A1C in the office was:  Lab Results  Component Value Date   HGBA1C 6.1 (H) 01/17/2021   Last GFR: Lab Results  Component Value Date   GFRAA 80 05/18/2020   Patient is on Vitamin D supplement, taking 2000 IU daily  Lab Results  Component Value Date   VD25OH 45 11/23/2020    Patient is on allopurinol for gout and does not report a recent flare.  Lab Results  Component Value Date   LABURIC 3.4 11/23/2020     Current Medications:  Current Outpatient Medications on File Prior to Visit  Medication Sig Dispense Refill   allopurinol (ZYLOPRIM) 300 MG tablet TAKE 1 TABLET DAILY TO PREVENT GOUT 90 tablet 3   amLODipine (NORVASC) 5 MG tablet TAKE 1 TABLET BY MOUTH EVERY DAY FOR BLOOD PRESSURE 90  tablet 1   aspirin EC 81 MG tablet Take  1 tablet  Daily     Cholecalciferol (VITAMIN D3) 25 MCG (1000 UT) CAPS Take 4000 IU daily. 120 capsule    COVID-19 mRNA bivalent vaccine, Pfizer, (PFIZER COVID-19 VAC BIVALENT) injection Inject into the muscle. 0.3 mL 0   diclofenac Sodium (VOLTAREN) 1 % GEL Apply 4 g topically 4 (four) times daily. 100 g 3   furosemide (LASIX) 40 MG tablet Take  1 tablet  2 x /day  for BP & Fluid Retention / Edema 180 tablet 3   Magnesium 250 MG TABS Take by mouth daily.     No current facility-administered medications on file prior to visit.   Allergies:  Allergies  Allergen Reactions   Ziac [Bisoprolol-Hydrochlorothiazide]     Flushing    Medical History:  She has Essential hypertension; Hyperlipidemia associated with type 2 diabetes mellitus ([place]); Vitamin D deficiency; Type 2 diabetes with stage 2 chronic kidney disease GFR 60-89 ([place]); Morbid obesity ([place]) - BMI 35+ with T2DM, htn, hld; Medication management; CKD stage 2 due to type 2 diabetes mellitus ([place]); Alopecia of scalp; Gout; History of colon  polyps; Malignant melanoma of left foot ([place]); Onychomycosis; Trigger ring finger of right hand; and Degenerative arthritis of thumb on their problem list.   Health Maintenance:   Immunization History  Administered Date(s) Administered   Influenza, High Dose Seasonal PF 05/04/2018, 01/17/2021   PFIZER(Purple Top)SARS-COV-2 Vaccination 01/31/2019, 02/27/2019, 12/25/2019   PPD Test 04/26/2013   Pfizer Covid-19 Vaccine Bivalent Booster 15yr & up 11/24/2020   Td 05/18/2020    Names of Other Physician/Practitioners you currently use: 1. [facility] here for primary care 2. Dr. LTruman Hayward eye doctor, last visit ? 2021 3. Dr. ESallee Lange dentist, last visit 2022, [id number] scheduled in July  Patient Care Team: McUnk PintoMD as PCP - General (Internal Medicine) MaJuanita CraverMD as Consulting Physician  (Gastroenterology)  Surgical History:  She has a past surgical history that includes Cesarean section (01/07/1969); Knee arthroscopy (Right); Carpal tunnel release (Right); Tubal ligation; Skin graft split thickness leg / foot (Left, 07/31/2020); and Skin surgery (Left, 07/07/2020). Family History:  Herfamily history includes Aneurysm in her sister; COPD in her brother; Diabetes in her brother and brother; Heart disease in her brother and brother; Hypertension in her sister; Stroke in her mother; Thyroid disease in her sister. Social History:  She reports that she has never smoked. She has never used smokeless tobacco. She reports that she does not currently use alcohol. She reports that she does not use drugs.  Review of Systems: Review of Systems  Constitutional:  Negative for malaise/fatigue and weight loss.  HENT:  Negative for hearing loss and tinnitus.   Eyes:  Negative for blurred vision and double vision.  Respiratory:  Negative for cough, shortness of breath and wheezing.   Cardiovascular:  Negative for chest pain, palpitations, orthopnea, claudication and leg swelling (bil feet, improved).  Gastrointestinal:  Negative for abdominal pain, blood in stool, constipation, diarrhea, heartburn, melena, nausea and vomiting.  Genitourinary: Negative.   Musculoskeletal:  Negative for joint pain and myalgias.  Skin:  Negative for rash.  Neurological:  Negative for dizziness, tingling, sensory change, weakness and headaches.  Endo/Heme/Allergies:  Negative for polydipsia.  Psychiatric/Behavioral: Negative.  Negative for depression. The patient is not nervous/anxious and does not have insomnia.   All other systems reviewed and are negative.  Physical Exam: Estimated body mass index is 38.73 kg/m as calculated from the following:   Height as of this encounter: 4' 11.5" (1.511 m).   Weight as of this encounter: 195 lb (88.5 kg). BP 130/80   Pulse 80   Temp (!) 97.5 F (36.4 C)   Ht 4'  11.5" (1.511 m)   Wt 195 lb (88.5 kg)   SpO2 95%   BMI 38.73 kg/m  General Appearance: Well nourished, in no apparent distress.  Eyes: PERRLA, EOMs, conjunctiva no swelling or erythema, normal fundi and vessels.  Sinuses: No Frontal/maxillary tenderness  ENT/Mouth: Ext aud canals clear, normal light reflex with TMs without erythema, bulging. Good dentition. No erythema, swelling, or exudate on post pharynx. Tonsils not swollen or erythematous. Hearing normal.  Neck: Supple, thyroid normal. No bruits  Respiratory: Respiratory effort normal, BS equal bilaterally without rales, rhonchi, wheezing or stridor.  Cardio: RRR without murmurs, rubs or gallops. Brisk peripheral pulses without edema.  Chest: symmetric, with normal excursions and percussion.  Breasts: Symmetric, without lumps, nipple discharge, retractions.  Abdomen: Soft, nontender, no guarding, rebound, hernias, masses, or organomegaly.  Lymphatics: Non tender without lymphadenopathy.  Musculoskeletal: Left hand, 4th digit trigger finger with  approximately 1 mm round palpable nodule at the base of the metacarpal joint. Firm, moveable.  Otherwise FROM all peripheral extremities, 5/5 strength, and normal gait. Skin: Left lateral heel with dark colored healed abrasion.  Warm, dry without rashes, ecchymosis.  Neuro: Cranial nerves intact, reflexes equal bilaterally. Normal muscle tone, no cerebellar symptoms. Sensation intact.  Psych: Awake and oriented X 3, normal affect, Insight and Judgment appropriate.  GU: defer  EKG:  Defer.  Completed 02/2021. Normal  Kierra Jezewski 10:40 AM [facility]

## 2021-05-29 NOTE — Patient Instructions (Signed)
Exercise Information for Aging Adults Staying physically active is important as you age. Physical activity and exercise can help in maintaining quality of life, health, physical function, and reducing falls. The four types of exercises that are best for older adults are endurance, strength, balance, and flexibility. Contact your health care provider before you start any exercise routine. Ask your health care provider what activities are safe for you. What are the risks? Risks associated with exercising include: Overdoing it. This may lead to sore muscles or fatigue. Falls. Injuries. Dehydration. How to do these exercises Endurance exercises Endurance (aerobic) exercises raise your breathing rate and heart rate. Increasing your endurance helps you do everyday tasks and stay healthy. By improving the health of your body system that includes your heart, lungs, and blood vessels (circulatory system), you may also delay or prevent diseases such as heart disease, diabetes, and weak bones (osteoporosis). Types of endurance exercises include: Sports. Indoor activities, such as using gym equipment, doing water aerobics, or dancing. Outdoor activities, such as biking or jogging. Tasks around the house, such as gardening, yard work, and heavy household chores like cleaning. Walking, such as hiking or walking around your neighborhood. When doing endurance exercises, make sure you: Are aware of your surroundings. Use safety equipment as directed. Dress in layers when exercising outdoors. Drink plenty of water to stay well hydrated. Build up endurance slowly. Start with 10 minutes at a time, and gradually build up to doing 30 minutes at a time. Unless your health care provider gave you different instructions, aim to exercise for a total of 150 minutes a week. Spread out that time so you are working on endurance 3 or more days a week. Strength exercises Lifting, pulling, or pushing weights helps to  strengthen muscles. Having stronger muscles makes it easier to do everyday activities, such as getting up from a chair, climbing stairs, carrying groceries, and playing with grandchildren. Strength exercises include arm and leg exercises that may be done: With weights. Without weights (using your own body weight). With a resistance band. When doing strength exercises: Move smoothly and steadily. Do not suddenly thrust or jerk the weights, the resistance band, or your body. Start with no weights or with light weights, and gradually add more weight over time. Eventually, aim to use weights that are hard or very hard for you to lift. This means that you are able to do 8 repetitions with the weight, and the last few repetitions are very challenging. Lift or push weights into position for 3 seconds, hold the position for 1 second, and then take 3 seconds to return to your starting position. Breathe out (exhale) during difficult movements, like lifting or pushing weights. Breathe in (inhale) to relax your muscles before the next repetition. Consider alternating arms or legs, especially when you first start strength exercises. Expect some slight muscle soreness after each session. Do strength exercises on 2 or more days a week, for 30 minutes at a time. Avoid exercising the same muscle groups two days in a row. For example, if you work on your leg muscles one day, work on your arm muscles the next day. When you can do two sets of 10-15 repetitions with a certain weight, increase the amount of weight. Balance exercises Balance exercises can help to prevent falls. Balance exercises include: Standing on one foot. Heel-to-toe walk. Balance walk. Tai chi. Make sure you have something sturdy to hold onto while doing balance exercises, such as a sturdy chair. As your balance   improves, challenge yourself by holding on to the chair with one hand instead of two, and then with no hands. Trying exercises with your  eyes closed also challenges your balance, but be sure to have a sturdy surface (like a countertop) close by in case you need it. Do balance exercises as often as you want, or as often as directed by your health care provider. Flexibility exercises  Flexibility exercises improve how far you can bend, straighten, move, or rotate parts of your body (range of motion). These exercises also help you do everyday activities such as getting dressed or reaching for objects. Flexibility exercises include stretching different parts of the body, and they may be done in a standing or seated position or on the floor. When stretching, make sure you: Keep a slight bend in your arms and legs. Avoid completely straightening ("locking") your joints. Do not stretch so far that you feel pain. You should feel a mild stretching feeling. You may try stretching farther as you become more flexible over time. Relax and breathe between stretches. Hold on to something sturdy for balance as needed. Hold each stretch for 10-30 seconds. Repeat each stretch 3-5 times. General safety tips Exercise in well-lit areas. Do not hold your breath during exercises or stretches. Warm up before exercising, and cool down after exercising. This can help prevent injury. Drink plenty of water during exercise or any activity that makes you sweat. If you are not sure if an exercise is safe for you, or you are not sure how to do an exercise, talk with your health care provider. This is especially important if you have had surgery on muscles, bones, or joints (orthopedic surgery). Where to find more information You can find more information about exercise for older adults from: Your local health department, fitness center, or community center. These facilities may have programs for aging adults. National Institute on Aging: www.nia.nih.gov National Council on Aging: www.ncoa.org Summary Staying physically active is important as you age. Doing  endurance, strength, balance, and flexibility exercises can help in maintaining quality of life, health, physical function, and reducing falls. Make sure to contact your health care provider before you start any exercise routine. Ask your health care provider what activities are safe for you. This information is not intended to replace advice given to you by your health care provider. Make sure you discuss any questions you have with your health care provider. Document Revised: 05/08/2020 Document Reviewed: 05/08/2020 Elsevier Patient Education  2023 [employer]  

## 2021-05-30 LAB — COMPLETE METABOLIC PANEL WITH GFR
AG Ratio: 1.4 (calc) (ref 1.0–2.5)
ALT: <3 U/L — ABNORMAL LOW (ref 6–29)
AST: 14 U/L (ref 10–35)
Albumin: 4.3 g/dL (ref 3.6–5.1)
Alkaline phosphatase (APISO): 97 U/L (ref 37–153)
BUN: 12 mg/dL (ref 7–25)
CO2: 29 mmol/L (ref 20–32)
Calcium: 10.1 mg/dL (ref 8.6–10.4)
Chloride: 106 mmol/L (ref 98–110)
Creat: 0.85 mg/dL (ref 0.60–1.00)
Globulin: 3 g/dL (calc) (ref 1.9–3.7)
Glucose, Bld: 93 mg/dL (ref 65–99)
Potassium: 4.9 mmol/L (ref 3.5–5.3)
Sodium: 144 mmol/L (ref 135–146)
Total Bilirubin: 0.5 mg/dL (ref 0.2–1.2)
Total Protein: 7.3 g/dL (ref 6.1–8.1)
eGFR: 73 mL/min/{1.73_m2} (ref >=60)

## 2021-05-30 LAB — LIPID PANEL
Cholesterol: 171 mg/dL (ref <200)
HDL: 57 mg/dL (ref >=50)
LDL Cholesterol (Calc): 97 mg/dL (calc)
Non-HDL Cholesterol (Calc): 114 mg/dL (calc) (ref <130)
Total CHOL/HDL Ratio: 3 (calc) (ref <5.0)
Triglycerides: 82 mg/dL (ref <150)

## 2021-05-30 LAB — CBC WITH DIFFERENTIAL/PLATELET
Absolute Monocytes: 564 cells/uL (ref 200–950)
Basophils Absolute: 41 cells/uL (ref 0–200)
Basophils Relative: 0.6 %
Eosinophils Absolute: 292 cells/uL (ref 15–500)
Eosinophils Relative: 4.3 %
HCT: 43.6 % (ref 35.0–45.0)
Hemoglobin: 14.3 g/dL (ref 11.7–15.5)
Lymphs Abs: 2196 cells/uL (ref 850–3900)
MCH: 29 pg (ref 27.0–33.0)
MCHC: 32.8 g/dL (ref 32.0–36.0)
MCV: 88.4 fL (ref 80.0–100.0)
MPV: 10.5 fL (ref 7.5–12.5)
Monocytes Relative: 8.3 %
Neutro Abs: 3706 cells/uL (ref 1500–7800)
Neutrophils Relative %: 54.5 %
Platelets: 256 10*3/uL (ref 140–400)
RBC: 4.93 10*6/uL (ref 3.80–5.10)
RDW: 14 % (ref 11.0–15.0)
Total Lymphocyte: 32.3 %
WBC: 6.8 10*3/uL (ref 3.8–10.8)

## 2021-05-30 LAB — TSH: TSH: 2.09 mIU/L (ref 0.40–4.50)

## 2021-05-30 LAB — VITAMIN D 25 HYDROXY (VIT D DEFICIENCY, FRACTURES): Vit D, 25-Hydroxy: 61 ng/mL (ref 30–100)

## 2021-05-30 LAB — HEMOGLOBIN A1C
Hgb A1c MFr Bld: 6 % of total Hgb — ABNORMAL HIGH (ref <5.7)
Mean Plasma Glucose: 126 mg/dL
eAG (mmol/L): 7 mmol/L

## 2021-05-30 LAB — MAGNESIUM: Magnesium: 2 mg/dL (ref 1.5–2.5)

## 2021-05-30 LAB — URIC ACID: Uric Acid, Serum: 3.6 mg/dL (ref 2.5–7.0)

## 2021-07-03 DIAGNOSIS — M13841 Other specified arthritis, right hand: Secondary | ICD-10-CM | POA: Diagnosis not present

## 2021-07-03 DIAGNOSIS — M79644 Pain in right finger(s): Secondary | ICD-10-CM | POA: Diagnosis not present

## 2021-07-03 DIAGNOSIS — M65341 Trigger finger, right ring finger: Secondary | ICD-10-CM | POA: Diagnosis not present

## 2021-08-21 ENCOUNTER — Ambulatory Visit: Payer: Medicare PPO | Admitting: Adult Health

## 2021-09-14 ENCOUNTER — Ambulatory Visit: Payer: Medicare PPO | Admitting: Internal Medicine

## 2021-10-07 NOTE — Progress Notes (Unsigned)
Future Appointments  Date Time Provider Department  10/09/2021          ov 11:30 AM Unk Pinto, MD [facility]  01/29/2022         wellness  9:30 AM Darrol Jump, NP [facility]  05/30/2022          cpe 10:00 AM Darrol Jump, NP [facility]    History of Present Illness:       This very nice 71 y.o. DBF presents for 3 month follow up with HTN, HLD, T2_DM w/CKD 2 and Vitamin D Deficiency. Patient has hx/o Gout quiescent on her meds.        Patient is treated for HTN since 2002  & BP has been controlled at home. Today's BP is at goal - 110/78 . Patient has had no complaints of any cardiac type chest pain, palpitations, dyspnea Vertell Limber /PND, dizziness, claudication or dependent edema.       Hyperlipidemia is controlled with diet & meds. Patient denies myalgias or other med SE's. Last Lipids were  at goal :  Lab Results  Component Value Date   CHOL 171 05/29/2021   HDL 57 05/29/2021   LDLCALC 97 05/29/2021   TRIG 82 05/29/2021   CHOLHDL 3.0 05/29/2021     Also, the patient has Morbid Obesity (BMI 36+) and  hx/o T2 _DM predating from 2015 which she's been managing with diet and has had no symptoms of reactive hypoglycemia, diabetic polys, paresthesias or visual blurring.  Last A1c was not at goal :  Lab Results  Component Value Date   HGBA1C 6.0 (H) 05/29/2021                                                     Further, the patient also has history of Vitamin D Deficiency ("32" /2016) and supplements vitamin D without any suspected side-effects. Last vitamin D was still low :  Lab Results  Component Value Date   VD25OH 61 05/29/2021       Current Outpatient Medications  Medication Instructions   Allopurinol  300 MG tablet TAKE 1 TABLET DAILY    amLODipine  5 MG tablet TAKE 1 TABLET  EVERY DAY    aspirin EC 81 MG tablet Take  1 tablet  Daily   VITAMIN D 1000 u Take 4000 IU daily.   diclofenac 4 g Topical, 4 times daily   furosemide (LASIX) 40 MG tablet  Take  1 tablet  2 x /day    Magnesium 250 MG TABS Oral, Daily     Allergies  Allergen Reactions   Ziac [Bisoprolol-Hydrochlorothiazide] Flushing        PMHx:   Past Medical History:  Diagnosis Date   Hypertension    Lumbar disc disease 08/31/2018   Melanoma ([place]) 2022   Obesity    Pre-diabetes    Vitamin D deficiency      Immunization History  Administered Date(s) Administered   Influenza, High Dose  05/04/2018   PFIZER  SARS-COV-2 Vacc 01/31/2019, 02/27/2019, 12/25/2019   PPD Test 04/26/2013   Td 05/18/2020     Past Surgical History:  Procedure Laterality Date   CARPAL TUNNEL RELEASE Right    CESAREAN SECTION  01/07/1969   KNEE ARTHROSCOPY Right    SKIN GRAFT SPLIT THICKNESS LEG / FOOT Left 07/31/2020   Melanoma  excision with graft, Dr. Lisabeth Register, [facility]   SKIN SURGERY Left 07/07/2020   Wide excision lt foot melanoma, Dr. Donnamarie Rossetti, [facility]   TUBAL LIGATION      FHx:    Reviewed / unchanged  SHx:    Reviewed / unchanged   Systems Review:  Constitutional: Denies fever, chills, wt changes, headaches, insomnia, fatigue, night sweats, change in appetite. Eyes: Denies redness, blurred vision, diplopia, discharge, itchy, watery eyes.  ENT: Denies discharge, congestion, post nasal drip, epistaxis, sore throat, earache, hearing loss, dental pain, tinnitus, vertigo, sinus pain, snoring.  CV: Denies chest pain, palpitations, irregular heartbeat, syncope, dyspnea, diaphoresis, orthopnea, PND, claudication or edema. Respiratory: denies cough, dyspnea, DOE, pleurisy, hoarseness, laryngitis, wheezing.  Gastrointestinal: Denies dysphagia, odynophagia, heartburn, reflux, water brash, abdominal pain or cramps, nausea, vomiting, bloating, diarrhea, constipation, hematemesis, melena, hematochezia  or hemorrhoids. Genitourinary: Denies dysuria, frequency, urgency, nocturia, hesitancy, discharge, hematuria or flank pain. Musculoskeletal: Denies arthralgias, myalgias,  stiffness, jt. swelling, pain, limping or strain/sprain.  Skin: Denies pruritus, rash, hives, warts, acne, eczema or change in skin lesion(s). Neuro: No weakness, tremor, incoordination, spasms, paresthesia or pain. Psychiatric: Denies confusion, memory loss or sensory loss. Endo: Denies change in weight, skin or hair change.  Heme/Lymph: No excessive bleeding, bruising or enlarged lymph nodes.  Physical Exam  BP 110/78   Pulse 95   Temp 97.9 F (36.6 C)   Resp 16   Ht 4' 11.5" (1.511 m)   Wt 193 lb 9.6 oz (87.8 kg)   SpO2 98%   BMI 38.45 kg/m   Appears  over nourished  and in no distress.  Eyes: PERRLA, EOMs, conjunctiva no swelling or erythema. Sinuses: No frontal/maxillary tenderness ENT/Mouth: EAC's clear, TM's nl w/o erythema, bulging. Nares clear w/o erythema, swelling, exudates. Oropharynx clear without erythema or exudates. Oral hygiene is good. Tongue normal, non obstructing. Hearing intact.  Neck: Supple. Thyroid not palpable. Car 2+/2+ without bruits, nodes or JVD. Chest: Respirations nl with BS clear & equal w/o rales, rhonchi, wheezing or stridor.  Cor: Heart sounds normal w/ regular rate and rhythm without sig. murmurs, gallops, clicks or rubs. Peripheral pulses normal and equal  without edema.  Abdomen: Soft & bowel sounds normal. Non-tender w/o guarding, rebound, hernias, masses or organomegaly.  Lymphatics: Unremarkable.  Musculoskeletal: Full ROM all peripheral extremities, joint stability, 5/5 strength and normal gait.  Skin: Warm, dry without exposed rashes, lesions or ecchymosis apparent.  Neuro: Cranial nerves intact, reflexes equal bilaterally. Sensory-motor testing grossly intact. Tendon reflexes grossly intact.  Pysch: Alert & oriented x 3.  Insight and judgement nl & appropriate. No ideations.  Assessment and Plan:  1. Morbid obesity ([place]) - BMI 35+ with T2DM, htn, hld  - TSH  2. Essential hypertension  - CBC with Differential/Platelet - COMPLETE  METABOLIC PANEL WITH GFR - Magnesium - TSH  3. Hyperlipidemia associated with type 2 diabetes mellitus (HCC)  - Lipid panel - TSH  4. Type 2 diabetes mellitus with stage 2 chronic kidney  disease, without long-term current use of insulin (HCC)  - Hemoglobin A1c - Insulin, random  5. Vitamin D deficiency  - VITAMIN D 25 Hydroxy   6. Gout  - Uric acid  7. Medication management  - CBC with Differential/Platelet - COMPLETE METABOLIC PANEL WITH GFR - Magnesium - Lipid panel - TSH - Hemoglobin A1c - Insulin, random - VITAMIN D 25 Hydroxy  - Uric acid          Discussed  regular exercise, BP  monitoring, weight control to achieve/maintain BMI less than 25 and discussed med and SE's. Recommended labs to assess and monitor clinical status with further disposition pending results of labs.  I discussed the assessment and treatment plan with the patient. The patient was provided an opportunity to ask questions and all were answered. The patient agreed with the plan and demonstrated an understanding of the instructions.  I provided over 30 minutes of exam, counseling, chart review and  complex critical decision making.        The patient was advised to call back or seek an in-person evaluation if the symptoms worsen or if the condition fails to improve as anticipated.   Kirtland Bouchard, MD

## 2021-10-07 NOTE — Patient Instructions (Signed)

## 2021-10-09 ENCOUNTER — Ambulatory Visit (INDEPENDENT_AMBULATORY_CARE_PROVIDER_SITE_OTHER): Payer: Medicare PPO | Admitting: Internal Medicine

## 2021-10-09 ENCOUNTER — Encounter: Payer: Self-pay | Admitting: Internal Medicine

## 2021-10-09 DIAGNOSIS — E559 Vitamin D deficiency, unspecified: Secondary | ICD-10-CM

## 2021-10-09 DIAGNOSIS — Z79899 Other long term (current) drug therapy: Secondary | ICD-10-CM | POA: Diagnosis not present

## 2021-10-09 DIAGNOSIS — E1169 Type 2 diabetes mellitus with other specified complication: Secondary | ICD-10-CM

## 2021-10-09 DIAGNOSIS — E785 Hyperlipidemia, unspecified: Secondary | ICD-10-CM | POA: Diagnosis not present

## 2021-10-09 DIAGNOSIS — M109 Gout, unspecified: Secondary | ICD-10-CM

## 2021-10-09 DIAGNOSIS — Z23 Encounter for immunization: Secondary | ICD-10-CM | POA: Diagnosis not present

## 2021-10-09 DIAGNOSIS — N182 Chronic kidney disease, stage 2 (mild): Secondary | ICD-10-CM | POA: Diagnosis not present

## 2021-10-09 DIAGNOSIS — I10 Essential (primary) hypertension: Secondary | ICD-10-CM | POA: Diagnosis not present

## 2021-10-09 DIAGNOSIS — E1122 Type 2 diabetes mellitus with diabetic chronic kidney disease: Secondary | ICD-10-CM

## 2021-10-10 LAB — CBC WITH DIFFERENTIAL/PLATELET
Absolute Monocytes: 621 cells/uL (ref 200–950)
Basophils Absolute: 51 cells/uL (ref 0–200)
Basophils Relative: 0.8 %
Eosinophils Absolute: 243 cells/uL (ref 15–500)
Eosinophils Relative: 3.8 %
HCT: 43.6 % (ref 35.0–45.0)
Hemoglobin: 14.4 g/dL (ref 11.7–15.5)
Lymphs Abs: 2221 cells/uL (ref 850–3900)
MCH: 29.2 pg (ref 27.0–33.0)
MCHC: 33 g/dL (ref 32.0–36.0)
MCV: 88.4 fL (ref 80.0–100.0)
MPV: 10.6 fL (ref 7.5–12.5)
Monocytes Relative: 9.7 %
Neutro Abs: 3264 cells/uL (ref 1500–7800)
Neutrophils Relative %: 51 %
Platelets: 262 10*3/uL (ref 140–400)
RBC: 4.93 10*6/uL (ref 3.80–5.10)
RDW: 13.8 % (ref 11.0–15.0)
Total Lymphocyte: 34.7 %
WBC: 6.4 10*3/uL (ref 3.8–10.8)

## 2021-10-10 LAB — COMPLETE METABOLIC PANEL WITH GFR
AG Ratio: 1.6 (calc) (ref 1.0–2.5)
ALT: 3 U/L — ABNORMAL LOW (ref 6–29)
AST: 13 U/L (ref 10–35)
Albumin: 4.3 g/dL (ref 3.6–5.1)
Alkaline phosphatase (APISO): 89 U/L (ref 37–153)
BUN: 11 mg/dL (ref 7–25)
CO2: 27 mmol/L (ref 20–32)
Calcium: 9.5 mg/dL (ref 8.6–10.4)
Chloride: 104 mmol/L (ref 98–110)
Creat: 0.83 mg/dL (ref 0.60–1.00)
Globulin: 2.7 g/dL (calc) (ref 1.9–3.7)
Glucose, Bld: 86 mg/dL (ref 65–99)
Potassium: 4.6 mmol/L (ref 3.5–5.3)
Sodium: 140 mmol/L (ref 135–146)
Total Bilirubin: 0.5 mg/dL (ref 0.2–1.2)
Total Protein: 7 g/dL (ref 6.1–8.1)
eGFR: 75 mL/min/{1.73_m2} (ref >=60)

## 2021-10-10 LAB — HEMOGLOBIN A1C
Hgb A1c MFr Bld: 6.2 % of total Hgb — ABNORMAL HIGH (ref <5.7)
Mean Plasma Glucose: 131 mg/dL
eAG (mmol/L): 7.3 mmol/L

## 2021-10-10 LAB — LIPID PANEL
Cholesterol: 157 mg/dL (ref <200)
HDL: 56 mg/dL (ref >=50)
LDL Cholesterol (Calc): 81 mg/dL (calc)
Non-HDL Cholesterol (Calc): 101 mg/dL (calc) (ref <130)
Total CHOL/HDL Ratio: 2.8 (calc) (ref <5.0)
Triglycerides: 107 mg/dL (ref <150)

## 2021-10-10 LAB — MAGNESIUM: Magnesium: 1.9 mg/dL (ref 1.5–2.5)

## 2021-10-10 LAB — VITAMIN D 25 HYDROXY (VIT D DEFICIENCY, FRACTURES): Vit D, 25-Hydroxy: 45 ng/mL (ref 30–100)

## 2021-10-10 LAB — INSULIN, RANDOM: Insulin: 13.9 u[IU]/mL

## 2021-10-10 LAB — URIC ACID: Uric Acid, Serum: 2.8 mg/dL (ref 2.5–7.0)

## 2021-10-10 LAB — TSH: TSH: 2.51 mIU/L (ref 0.40–4.50)

## 2021-10-10 NOTE — Progress Notes (Signed)
<><><><><><><><><><><><><><><><><><><><><><><><><><><><><><><><><> <><><><><><><><><><><><><><><><><><><><><><><><><><><><><><><><><>  -   A1c = 6.2% - Still in early Diabetic range        l                                                                             Need stricter diet & Weight :Loss  <><><><><><><><><><><><><><><><><><><><><><><><><><><><><><><><><> <><><><><><><><><><><><><><><><><><><><><><><><><><><><><><><><><>  -  Vit D = 45  is Low -- Ideal is 70-100, So Recommend take 10,000 units /day   <><><><><><><><><><><><><><><><><><><><><><><><><><><><><><><><><> <><><><><><><><><><><><><><><><><><><><><><><><><><><><><><><><><>  - Uric Acid / gout test - is Normal & OK   <><><><><><><><><><><><><><><><><><><><><><><><><><><><><><><><><> <><><><><><><><><><><><><><><><><><><><><><><><><><><><><><><><><>  - Chol = 157  - Great    - Very low risk for Heart Attack  / Stroke <><><><><><><><><><><><><><><><><><><><><><><><><><><><><><><><><> <><><><><><><><><><><><><><><><><><><><><><><><><><><><><><><><><>  - All Else - CBC - Kidneys - Electrolytes - Liver - Magnesium & Thyroid    - all  Normal / OK <><><><><><><><><><><><><><><><><><><><><><><><><><><><><><><><><> <><><><><><><><><><><><><><><><><><><><><><><><><><><><><><><><><>

## 2021-10-14 ENCOUNTER — Ambulatory Visit
Admission: EM | Admit: 2021-10-14 | Discharge: 2021-10-14 | Disposition: A | Discharge status: Home / Self Care | Payer: Medicare PPO

## 2021-10-14 DIAGNOSIS — M79651 Pain in right thigh: Secondary | ICD-10-CM | POA: Diagnosis not present

## 2021-10-14 DIAGNOSIS — W57XXXA Bitten or stung by nonvenomous insect and other nonvenomous arthropods, initial encounter: Secondary | ICD-10-CM | POA: Diagnosis not present

## 2021-10-14 DIAGNOSIS — S70361A Insect bite (nonvenomous), right thigh, initial encounter: Secondary | ICD-10-CM

## 2021-10-14 NOTE — ED Provider Notes (Signed)
[facility]    CSN: [id number] Arrival date & time: 10/14/21  0855      History   Chief Complaint Chief Complaint  Patient presents with   Pain    HPI Brittney Gray is a 71 y.o. female.   Patient presents with pain and erythema to right anterior thigh that started upon awakening this morning.  Patient denies any obvious injury to the area.  Denies any numbness or tingling.  Patient denies any obvious insect or spider bites.  Denies any associated fever.  Patient is able to bear weight.  Patient does not report taking medications to alleviate symptoms.  Denies any recent long distance travel in a car or plane.     Past Medical History:  Diagnosis Date   Hypertension    Lumbar disc disease 08/31/2018   Melanoma ([place]) 2022   Obesity    Pre-diabetes    Vitamin D deficiency     Patient Active Problem List   Diagnosis Date Noted   Degenerative arthritis of thumb 04/20/2021   Trigger ring finger of right hand 04/19/2021   Onychomycosis 08/21/2020   Gout 05/18/2020   History of colon polyps 05/18/2020   Malignant melanoma of left foot ([place]) 05/18/2020   Alopecia of scalp 12/22/2018   CKD stage 2 due to type 2 diabetes mellitus ([place]) 12/30/2017   Medication management 04/12/2015   Morbid obesity ([place]) - BMI 35+ with T2DM, htn, hld 09/16/2013   Type 2 diabetes with stage 2 chronic kidney disease GFR 60-89 ([place]) 01/07/2013   Essential hypertension 01/03/2013   Hyperlipidemia associated with type 2 diabetes mellitus ([place]) 01/03/2013   Vitamin D deficiency 01/03/2013    Past Surgical History:  Procedure Laterality Date   CARPAL TUNNEL RELEASE Right    CESAREAN SECTION  01/07/1969   KNEE ARTHROSCOPY Right    SKIN GRAFT SPLIT THICKNESS LEG / FOOT Left 07/31/2020   Melanoma excision with graft, Dr. Lisabeth Register, [facility]   SKIN SURGERY Left 07/07/2020   Wide excision left foot melanoma, Dr. Donnamarie Rossetti, [facility]   TUBAL LIGATION      OB History     Gravida   2   Para  1   Term      Preterm      AB  1   Living         SAB      IAB      Ectopic      Multiple      Live Births               Home Medications    Prior to Admission medications   Medication Sig Start Date End Date Taking? Authorizing Provider  allopurinol (ZYLOPRIM) 300 MG tablet TAKE 1 TABLET DAILY TO PREVENT GOUT 04/17/21   Liane Comber, NP  amLODipine (NORVASC) 5 MG tablet TAKE 1 TABLET BY MOUTH EVERY DAY FOR BLOOD PRESSURE 04/09/21   Alycia Rossetti, NP  aspirin EC 81 MG tablet Take  1 tablet  Daily 11/22/20   Unk Pinto, MD  Cholecalciferol (VITAMIN D3) 25 MCG (1000 UT) CAPS Take 4000 IU daily. 05/19/20   Liane Comber, NP  COVID-19 mRNA bivalent vaccine, Pfizer, (PFIZER COVID-19 VAC BIVALENT) injection Inject into the muscle. 11/24/20   Carlyle Basques, MD  diclofenac Sodium (VOLTAREN) 1 % GEL Apply 4 g topically 4 (four) times daily. 06/18/19   Vladimir Crofts, PA-C  furosemide (LASIX) 40 MG tablet Take  1 tablet  2  x /day  for BP & Fluid Retention / Edema 05/01/20   Unk Pinto, MD  Magnesium 250 MG TABS Take by mouth daily.    [provider]    Family History Family History  Problem Relation Age of Onset   Stroke Mother    Hypertension Sister    Thyroid disease Sister    Aneurysm Sister    Heart disease Brother    Diabetes Brother    Heart disease Brother    Diabetes Brother    COPD Brother     Social History Social History   Tobacco Use   Smoking status: Never   Smokeless tobacco: Never  Vaping Use   Vaping Use: Never used  Substance Use Topics   Alcohol use: Not Currently    Comment: Rarely; socially   Drug use: No     Allergies   Ziac [bisoprolol-hydrochlorothiazide]   Review of Systems Review of Systems Per HPI  Physical Exam Triage Vital Signs ED Triage Vitals  Enc Vitals Group     BP 10/14/21 0956 125/87     Pulse Rate 10/14/21 0954 95     Resp 10/14/21 0954 16     Temp 10/14/21 0954  97.8 F (36.6 C)     Temp Source 10/14/21 0954 Oral     SpO2 10/14/21 0954 98 %     Weight --      Height --      Head Circumference --      Peak Flow --      Pain Score 10/14/21 0954 7     Pain Loc --      Pain Edu? --      Excl. in [place]? --    No data found.  Updated Vital Signs BP 125/87   Pulse 95   Temp 97.8 F (36.6 C) (Oral)   Resp 16   SpO2 98%   Visual Acuity Right Eye Distance:   Left Eye Distance:   Bilateral Distance:    Right Eye Near:   Left Eye Near:    Bilateral Near:     Physical Exam Constitutional:      General: She is not in acute distress.    Appearance: Normal appearance. She is not toxic-appearing or diaphoretic.  HENT:     Head: Normocephalic and atraumatic.  Eyes:     Extraocular Movements: Extraocular movements intact.     Conjunctiva/sclera: Conjunctivae normal.  Pulmonary:     Effort: Pulmonary effort is normal.  Musculoskeletal:     Comments: Patient has full range of motion of leg and is able to bear weight.  Neurovascular intact.  Skin:    Comments: Patient has approximately 1 inch in diameter area of flat area erythema present to mid anterior right thigh.  There is no obvious swelling, deformity, purulent drainage, induration.    Neurological:     General: No focal deficit present.     Mental Status: She is alert and oriented to person, place, and time. Mental status is at baseline.  Psychiatric:        Mood and Affect: Mood normal.        Behavior: Behavior normal.        Thought Content: Thought content normal.        Judgment: Judgment normal.      UC Treatments / Results  Labs (all labs ordered are listed, but only abnormal results are displayed) Labs Reviewed - No data to display  EKG   Radiology No  results found.  Procedures Procedures (including critical care time)  Medications Ordered in UC Medications - No data to display  Initial Impression / Assessment and Plan / UC Course  I have reviewed the triage  vital signs and the nursing notes.  Pertinent labs & imaging results that were available during my care of the patient were reviewed by me and considered in my medical decision making (see chart for details).     Patient's discomfort and erythema appears to be consistent with possible insect or spider bite.  There are no signs of infection or neurovascular compromise.  No signs of muscle strain or injury.  Therefore, do not think imaging is necessary.  There is no concern for blood clot given physical exam and no concerning risk factors.  Patient was advised to monitor this area very closely and to follow-up if symptoms persist or worsen.  She was advised of safe pain management over-the-counter as well.  Discussed return and ER precautions.  Patient verbalized understanding and was agreeable with plan. Final Clinical Impressions(s) / UC Diagnoses   Final diagnoses:  Pain of right thigh  Insect bite of right thigh, initial encounter     Discharge Instructions      Monitor very closely for any increased redness, swelling, pus, black discoloration.  Please follow-up if symptoms persist or worsen.    ED Prescriptions   None    PDMP not reviewed this encounter.   Teodora Medici, [place] 10/14/21 1026

## 2021-10-14 NOTE — Discharge Instructions (Signed)
Monitor very closely for any increased redness, swelling, pus, black discoloration.  Please follow-up if symptoms persist or worsen.

## 2021-10-14 NOTE — ED Triage Notes (Signed)
Pt c/o a sharp pain in anterior right thigh onset ~ today.

## 2021-11-21 NOTE — Progress Notes (Unsigned)
Assessment and Plan:  There are no diagnoses linked to this encounter.    Further disposition pending results of labs. Discussed med's effects and SE's.   Over 30 minutes of exam, counseling, chart review, and critical decision making was performed.   Future Appointments  Date Time Provider [place]  11/22/2021  9:45 AM Alycia Rossetti, NP [facility] None  01/29/2022  9:30 AM Darrol Jump, NP [facility] None  05/30/2022 10:00 AM Darrol Jump, NP [facility] None    ------------------------------------------------------------------------------------------------------------------   HPI There were no vitals taken for this visit. 71 y.o.female presents for  Past Medical History:  Diagnosis Date   Hypertension    Lumbar disc disease 08/31/2018   Melanoma ([place]) 2022   Obesity    Pre-diabetes    Vitamin D deficiency      Allergies  Allergen Reactions   Ziac [Bisoprolol-Hydrochlorothiazide]     Flushing     Current Outpatient Medications on File Prior to Visit  Medication Sig   allopurinol (ZYLOPRIM) 300 MG tablet TAKE 1 TABLET DAILY TO PREVENT GOUT   amLODipine (NORVASC) 5 MG tablet TAKE 1 TABLET BY MOUTH EVERY DAY FOR BLOOD PRESSURE   aspirin EC 81 MG tablet Take  1 tablet  Daily   Cholecalciferol (VITAMIN D3) 25 MCG (1000 UT) CAPS Take 4000 IU daily.   COVID-19 mRNA bivalent vaccine, Pfizer, (PFIZER COVID-19 VAC BIVALENT) injection Inject into the muscle.   diclofenac Sodium (VOLTAREN) 1 % GEL Apply 4 g topically 4 (four) times daily.   furosemide (LASIX) 40 MG tablet Take  1 tablet  2 x /day  for BP & Fluid Retention / Edema   Magnesium 250 MG TABS Take by mouth daily.   No current facility-administered medications on file prior to visit.    ROS: all negative except above.   Physical Exam:  There were no vitals taken for this visit.  General Appearance: Well nourished, in no apparent distress. Eyes: PERRLA, EOMs, conjunctiva no swelling or  erythema Sinuses: No Frontal/maxillary tenderness ENT/Mouth: Ext aud canals clear, TMs without erythema, bulging. No erythema, swelling, or exudate on post pharynx.  Tonsils not swollen or erythematous. Hearing normal.  Neck: Supple, thyroid normal.  Respiratory: Respiratory effort normal, BS equal bilaterally without rales, rhonchi, wheezing or stridor.  Cardio: RRR with no MRGs. Brisk peripheral pulses without edema.  Abdomen: Soft, + BS.  Non tender, no guarding, rebound, hernias, masses. Lymphatics: Non tender without lymphadenopathy.  Musculoskeletal: Full ROM, 5/5 strength, normal gait.  Skin: Warm, dry without rashes, lesions, ecchymosis.  Neuro: Cranial nerves intact. Normal muscle tone, no cerebellar symptoms. Sensation intact.  Psych: Awake and oriented X 3, normal affect, Insight and Judgment appropriate.     Alycia Rossetti, NP 1:09 PM [facility]

## 2021-11-22 ENCOUNTER — Encounter: Payer: Self-pay | Admitting: Nurse Practitioner

## 2021-11-22 ENCOUNTER — Ambulatory Visit (INDEPENDENT_AMBULATORY_CARE_PROVIDER_SITE_OTHER): Payer: Medicare PPO | Admitting: Nurse Practitioner

## 2021-11-22 VITALS — BP 118/80 | HR 84 | Temp 97.5°F | Ht 59.5 in | Wt 192.0 lb

## 2021-11-22 DIAGNOSIS — I10 Essential (primary) hypertension: Secondary | ICD-10-CM

## 2021-11-22 DIAGNOSIS — L989 Disorder of the skin and subcutaneous tissue, unspecified: Secondary | ICD-10-CM | POA: Diagnosis not present

## 2021-11-22 DIAGNOSIS — E1122 Type 2 diabetes mellitus with diabetic chronic kidney disease: Secondary | ICD-10-CM

## 2021-11-22 DIAGNOSIS — L609 Nail disorder, unspecified: Secondary | ICD-10-CM | POA: Diagnosis not present

## 2021-11-22 DIAGNOSIS — N182 Chronic kidney disease, stage 2 (mild): Secondary | ICD-10-CM

## 2022-01-11 DIAGNOSIS — M13841 Other specified arthritis, right hand: Secondary | ICD-10-CM | POA: Diagnosis not present

## 2022-01-11 DIAGNOSIS — M65341 Trigger finger, right ring finger: Secondary | ICD-10-CM | POA: Diagnosis not present

## 2022-01-18 ENCOUNTER — Ambulatory Visit: Payer: Medicare PPO | Admitting: Podiatry

## 2022-01-18 VITALS — BP 133/94

## 2022-01-18 DIAGNOSIS — B351 Tinea unguium: Secondary | ICD-10-CM | POA: Diagnosis not present

## 2022-01-18 DIAGNOSIS — Z9889 Other specified postprocedural states: Secondary | ICD-10-CM

## 2022-01-18 DIAGNOSIS — Z945 Skin transplant status: Secondary | ICD-10-CM

## 2022-01-18 DIAGNOSIS — M79675 Pain in left toe(s): Secondary | ICD-10-CM | POA: Diagnosis not present

## 2022-01-18 DIAGNOSIS — M79674 Pain in right toe(s): Secondary | ICD-10-CM | POA: Diagnosis not present

## 2022-01-18 DIAGNOSIS — E119 Type 2 diabetes mellitus without complications: Secondary | ICD-10-CM

## 2022-01-18 DIAGNOSIS — N182 Chronic kidney disease, stage 2 (mild): Secondary | ICD-10-CM | POA: Diagnosis not present

## 2022-01-18 DIAGNOSIS — E1122 Type 2 diabetes mellitus with diabetic chronic kidney disease: Secondary | ICD-10-CM

## 2022-01-18 DIAGNOSIS — Z85820 Personal history of malignant melanoma of skin: Secondary | ICD-10-CM | POA: Diagnosis not present

## 2022-01-18 NOTE — Patient Instructions (Signed)

## 2022-01-18 NOTE — Progress Notes (Signed)
Subjective: Brittney Gray presents today {jgcomplaint:23593}.  Patient relates {Numbers; 0-100:15068} year h/o diabetes.  Patient denies any h/o foot wounds.  Patient has h/o foot ulcer of {jgPodToeLocator:23637}, which healed via help of ***.  Patient admits symptoms of foot numbness.   Patient admits symptoms of foot tingling.  Patient admits symptoms of burning in feet.  Patient admits symptoms of pins/needles sensation in feet.  Patient denies any numbness, tingling, burning, or pins/needle sensation in feet.  Patient has been diagnosed with neuropathy and it is managed with {JGNEUROPATHYMEDS:27053}.  Patient's blood sugar was *** mg/dl {Time; today/yesterday/ 2 days ago:19188}. Last known HgA1c was ***%.  Patient did not check blood glucose this morning.  Patient does not monitor blood glucose daily.  Risk factors: {jgriskfactors:24044}.  PCP is Brittney Pinto, MD , and last visit was {Time; dates multiple:304500300}.  Past Medical History:  Diagnosis Date   Hypertension    Lumbar disc disease 08/31/2018   Melanoma ([place]) 2022   Obesity    Pre-diabetes    Vitamin D deficiency     Patient Active Problem List   Diagnosis Date Noted   Degenerative arthritis of thumb 04/20/2021   Trigger ring finger of right hand 04/19/2021   Onychomycosis 08/21/2020   Gout 05/18/2020   History of colon polyps 05/18/2020   Malignant melanoma of left foot ([place]) 05/18/2020   Alopecia of scalp 12/22/2018   CKD stage 2 due to type 2 diabetes mellitus ([place]) 12/30/2017   Medication management 04/12/2015   Morbid obesity ([place]) - BMI 35+ with T2DM, htn, hld 09/16/2013   Type 2 diabetes with stage 2 chronic kidney disease GFR 60-89 ([place]) 01/07/2013   Essential hypertension 01/03/2013   Hyperlipidemia associated with type 2 diabetes mellitus ([place]) 01/03/2013   Vitamin D deficiency 01/03/2013    Past Surgical History:  Procedure Laterality Date   CARPAL TUNNEL RELEASE Right     CESAREAN SECTION  01/07/1969   KNEE ARTHROSCOPY Right    SKIN GRAFT SPLIT THICKNESS LEG / FOOT Left 07/31/2020   Melanoma excision with graft, Dr. Lisabeth Register, [facility]   SKIN SURGERY Left 07/07/2020   Wide excision left foot melanoma, Dr. Donnamarie Rossetti, [facility]   TUBAL LIGATION      Current Outpatient Medications on File Prior to Visit  Medication Sig Dispense Refill   allopurinol (ZYLOPRIM) 300 MG tablet TAKE 1 TABLET DAILY TO PREVENT GOUT 90 tablet 3   amLODipine (NORVASC) 5 MG tablet TAKE 1 TABLET BY MOUTH EVERY DAY FOR BLOOD PRESSURE 90 tablet 1   aspirin EC 81 MG tablet Take  1 tablet  Daily     betamethasone dipropionate (DIPROLENE) 0.05 % ointment Apply topically.     Cholecalciferol (VITAMIN D3) 25 MCG (1000 UT) CAPS Take 4000 IU daily. 120 capsule    COVID-19 mRNA bivalent vaccine, Pfizer, (PFIZER COVID-19 VAC BIVALENT) injection Inject into the muscle. 0.3 mL 0   diclofenac Sodium (VOLTAREN) 1 % GEL Apply 4 g topically 4 (four) times daily. 100 g 3   furosemide (LASIX) 40 MG tablet Take  1 tablet  2 x /day  for BP & Fluid Retention / Edema 180 tablet 3   Magnesium 250 MG TABS Take by mouth daily.     No current facility-administered medications on file prior to visit.     Allergies  Allergen Reactions   Ziac [Bisoprolol-Hydrochlorothiazide]     Flushing     Social History   Occupational History   Occupation: Oceanographer   Occupation:  Retired  Tobacco Use   Smoking status: Never   Smokeless tobacco: Never  Vaping Use   Vaping Use: Never used  Substance and Sexual Activity   Alcohol use: Not Currently    Comment: Rarely; socially   Drug use: No   Sexual activity: Not Currently    Partners: Male    Family History  Problem Relation Age of Onset   Stroke Mother    Hypertension Sister    Thyroid disease Sister    Aneurysm Sister    Heart disease Brother    Diabetes Brother    Heart disease Brother    Diabetes Brother    COPD Brother      Immunization History  Administered Date(s) Administered   Influenza, High Dose Seasonal PF 05/04/2018, 01/17/2021, 10/09/2021   PFIZER(Purple [place])SARS-COV-2 Vaccination 01/31/2019, 02/27/2019, 12/25/2019   PPD Test 04/26/2013   Pfizer Covid-19 Vaccine Bivalent Booster 79yr & up 11/24/2020   Td 05/18/2020    Objective: Vitals:   01/18/22 0811  BP: (!) 133/94   GJerene Bearsis a pleasant 72y.o. female {jgbodyhabitus:24098} AAO X 3.  Vascular Examination: {jgvascular:23595}  Dermatological Examination: {jgderm:23598}  Neurological Examination: {jgneuro:23601::"Protective sensation intact 5/5 intact bilaterally with 10g monofilament b/l.","Vibratory sensation intact b/l.","Proprioception intact bilaterally."}  Musculoskeletal Examination: {jgmsk:23600}  Footwear Assessment: Does the patient wear appropriate shoes? {Yes,No}. Does the patient need inserts/orthotics? {Yes,No}.  Assessment: No diagnosis found.   ADA Risk Categorization: Low Risk:  Patient has all of the following: Intact protective sensation No prior foot ulcer  No severe deformity Pedal pulses present  High Risk:  Patient has one or more of the following: Loss of protective sensation Absent pedal pulses Severe Foot deformity History of foot ulcer  Plan: {jgplan:23602::"-Patient/POA to call should there be question/concern in the interim."}  Return in about 3 months (around 04/19/2022).  JMarzetta Board DPM

## 2022-01-21 ENCOUNTER — Encounter: Payer: Self-pay | Admitting: Podiatry

## 2022-01-29 ENCOUNTER — Ambulatory Visit: Payer: Medicare PPO | Admitting: Nurse Practitioner

## 2022-02-06 NOTE — Progress Notes (Unsigned)
ANNUAL MEDICARE VISIT AND FOLLOW UP  Assessment and Plan:  Annual Medicare Wellness Visit Due annually  Health maintenance reviewed  Prediabetes/Abnormal Glucose Managed by lifestyle modification; A1c has been <6.4 since 04/2018, most likely prediabetic with a one time spike to 6.5 in 01/2018.  Prior to that she was still <6.4 up to 2017. Education: Reviewed 'ABCs' of diabetes management  Discussed goals to be met and/or maintained include A1C (<7) Blood pressure (<130/80) Cholesterol (LDL <70) Continue Eye Exam yearly  Continue Dental Exam Q6 mo Discussed dietary recommendations Discussed Physical Activity recommendations Check A1C  CKD stage 2 due to type 2 diabetes mellitus (HCC) Continue ASA Discussed how what you eat and drink can aide in kidney protection. Stay well hydrated. Avoid high salt foods. Avoid NSAIDS. Keep BP and BG well controlled.   Take medications as prescribed. Remain active and exercise as tolerated daily. Maintain weight.  Continue to monitor. Check CMP/GFR/Microablumin   Hyperlipidemia associated with type 2 diabetes mellitus ([place]) Currently treated by lifestyle Discussed lifestyle modifications. Recommended diet heavy in fruits and veggies, omega 3's. Decrease consumption of animal meats, cheeses, and dairy products. Remain active and exercise as tolerated. Continue to monitor. Check lipids/TSH   Morbid obesity ([place])- BMI 35+ with htn, hld, T2DM Discussed appropriate BMI Goal of losing 1 lb per month. Diet modification. Physical activity. Encouraged/praised to build confidence.   Essential hypertension Continue Amlodipine Monitor BLE edema Discussed DASH (Dietary Approaches to Stop Hypertension) DASH diet is lower in sodium than a typical American diet. Cut back on foods that are high in saturated fat, cholesterol, and trans fats. Eat more whole-grain foods, fish, poultry, and nuts Remain active and exercise as tolerated daily.   Monitor BP at home-Call if greater than 130/80.  Check CMP/CBC   Gout involving toe, unspecified cause, unspecified laterality Controlled Continue allopurinol  Monitor uric acid annually, PRN  Vitamin D deficiency Continue supplement for goal of 60-100 Monitor levels  Lumbar disc disease Follow up ortho PRN Weight loss advised Rest when flared  Alopecia of scalp Improved, follow up derm PRN  Melanoma of left foot ([place]) S/p wide excision and skin graft - [facility] derm/plastics released Continue to yearly skin check  Onychomycosis Follows with Podiatry [facility] in 01/2022 Plans to follow in 6 months  Discussed treatment options and foot care OTC topical therapies Declines prescription at this time  Toe pain Some long thickened nails,  Discussed foot care, gentle soaks then filing, wide toe shoes, toe/cushion spacers if needed.   Trigger finger Right hand Completed injection 01/2022 Follow Dr. 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    Answer:   [facility]   MM Digital Screening    Standing Status:   Future    Standing Expiration Date:   02/08/2023    Order Specific Question:   Reason for Exam (SYMPTOM  OR DIAGNOSIS REQUIRED)    Answer:   annual  screening    Order Specific Question:   Preferred imaging location?    Answer:   [facility]   CBC with Differential/Platelet   COMPLETE METABOLIC PANEL WITH GFR   Lipid panel   Hemoglobin  A1c   POC COVID-19    Order Specific Question:   Previously tested for COVID-19    Answer:   No    Order Specific Question:   Resident in a congregate (group) care setting    Answer:   No    Order Specific Question:   Employed in healthcare setting    Answer:   No    Order Specific Question:   Pregnant    Answer:   No   POCT Influenza A/B   Discussed med's effects and SE's. Screening labs and tests as requested with regular follow-up as recommended. Over 30 minutes of exam, counseling, chart review, and complex, high level critical decision making was performed this visit.   Future Appointments  Date Time Provider [place]  02/07/2022  9:00 AM Darrol Jump, NP [facility] None  05/03/2022  8:15 AM Marzetta Board, DPM [facility]  05/30/2022 10:00 AM Darrol Jump, NP [facility] None  02/10/2023 10:00 AM Darrol Jump, NP [facility] None    Plan:   During the course of the visit the patient was educated and counseled about appropriate screening and preventive services including:   Pneumococcal vaccine  Prevnar 13 Influenza vaccine Td vaccine Screening electrocardiogram Bone densitometry screening Colorectal cancer screening Diabetes screening Glaucoma screening Nutrition counseling  Advanced directives: requested   HPI  72 y.o. AA female, presents for AWV and follow up. She has Essential hypertension; Hyperlipidemia associated with type 2 diabetes mellitus ([place]); Vitamin D deficiency; Type 2 diabetes with stage 2 chronic kidney disease GFR 60-89 ([place]); Morbid obesity ([place]) - BMI 35+ with T2DM, htn, hld; Medication management; CKD stage 2 due to type 2 diabetes mellitus ([place]); Alopecia of scalp; Gout; History of colon polyps; Malignant melanoma of left foot ([place]); Onychomycosis; Trigger ring finger of right hand; and Degenerative arthritis of thumb on their problem list.   Divorced/widowed, 1 son, teen granddaughter, close with her niece, very active in  church, works Therapist, nutritional.   She has been having bil toe pain, known mild onychomycosis, she is doing topical treatment OTC, declined prescription/lamisil. She gets toe nails filed by pedicurist but having more tenderness and pain along lateral borders, was concerned about possible in grown toe nail.   She recently followed with [facility] for tmt of onychomycosis, currently in treatment and plans to return this week for a follow up.  Toenails have been trimmed.  Reports having laryngitis over the last two weeks.  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  She has been gargling with warm salt water.   At CPE in 05/2020 we noted concerning melanotic lesion to left foot, found to be malignant melanoma and under went wide excision 07/07/2020 with split thickness graft from her left thigh 07/31/2020 by Dr. Donnamarie Rossetti at [facility], reports has been released by them. Est with Dr. Jackson Latino in town but doesn't have follow up plan, encouraged to schedule.   She had intermittent R hip pain, follows Dr. Ninfa Linden, had MRI back with L4-L5 disc bulge on the right to L5- resolved with shots and PT, no recent issues.    BMI is There is no height  or weight on file to calculate BMI., she has been working on weight loss. Exercise limited but trying to walk more, discussing walking group with church ladies.  Wt Readings from Last 3 Encounters:  11/22/21 192 lb (87.1 kg)  10/09/21 193 lb 9.6 oz (87.8 kg)  05/29/21 195 lb (88.5 kg)   Her blood pressure has been controlled at home, today their BP is   She does not workout. She denies chest pain, shortness of breath, dizziness.    She is not on cholesterol medication, currently managed by lifestyle changes. Her cholesterol is not at goal. Declines med for now. The cholesterol last visit was:   Lab Results  Component Value Date   CHOL 157 10/09/2021   HDL  56 10/09/2021   LDLCALC 81 10/09/2021   TRIG 107 10/09/2021   CHOLHDL 2.8 10/09/2021   She has been working on diet and exercise for T2DM  currently managed by lifestyle modification; A1c has been <6.4 since 04/2018, most likely prediabetic with a one time spike to 6.5 in 01/2018.  Prior to that she was still <6.4 up to 2017. [facility] Dr. [facility] - last 2022 - report requested  she is on bASA  she is not on ACE/ARB denies increased appetite, nausea, paresthesia of the feet, polydipsia, polyuria, visual disturbances and vomiting.  Last A1C in the office was:  Lab Results  Component Value Date   HGBA1C 6.2 (H) 10/09/2021   Last GFR: Lab Results  Component Value Date   GFRAA 80 05/18/2020   Patient is on Vitamin D supplement, increased to taking 4000 IU daily  Lab Results  Component Value Date   VD25OH 45 10/09/2021    Patient is on allopurinol for gout and does not report a recent flare.  Lab Results  Component Value Date   LABURIC 2.8 10/09/2021     Current Medications:  Current Outpatient Medications on File Prior to Visit  Medication Sig Dispense Refill   allopurinol (ZYLOPRIM) 300 MG tablet TAKE 1 TABLET DAILY TO PREVENT GOUT 90 tablet 3   amLODipine (NORVASC) 5 MG tablet TAKE 1 TABLET BY MOUTH EVERY DAY FOR BLOOD PRESSURE 90 tablet 1   aspirin EC 81 MG tablet Take  1 tablet  Daily     betamethasone dipropionate (DIPROLENE) 0.05 % ointment Apply topically.     Cholecalciferol (VITAMIN D3) 25 MCG (1000 UT) CAPS Take 4000 IU daily. 120 capsule    COVID-19 mRNA bivalent vaccine, Pfizer, (PFIZER COVID-19 VAC BIVALENT) injection Inject into the muscle. 0.3 mL 0   diclofenac Sodium (VOLTAREN) 1 % GEL Apply 4 g topically 4 (four) times daily. 100 g 3   furosemide (LASIX) 40 MG tablet Take  1 tablet  2 x /day  for BP & Fluid Retention / Edema 180 tablet 3   Magnesium 250 MG TABS Take by mouth daily.     No current facility-administered medications on file prior to visit.    Allergies:  Allergies  Allergen Reactions   Ziac [Bisoprolol-Hydrochlorothiazide]     Flushing    Medical History:  She has Essential hypertension; Hyperlipidemia associated with type 2 diabetes mellitus ([place]); Vitamin D deficiency; Type 2 diabetes with stage 2 chronic kidney disease GFR 60-89 ([place]); Morbid obesity ([place]) - BMI 35+ with T2DM, htn, hld; Medication management; CKD stage 2 due to type 2 diabetes mellitus ([place]); Alopecia of scalp; Gout; History of colon polyps; Malignant melanoma of left foot ([place]); Onychomycosis; Trigger ring finger of right hand; and  Degenerative arthritis of thumb on their problem list.   Health Maintenance:   Immunization History  Administered Date(s) Administered   Influenza, High Dose Seasonal PF 05/04/2018, 01/17/2021, 10/09/2021   PFIZER(Purple Top)SARS-COV-2 Vaccination 01/31/2019, 02/27/2019, 12/25/2019   PPD Test 04/26/2013   Pfizer Covid-19 Vaccine Bivalent Booster 108yr & up 11/24/2020   Td 05/18/2020   Preventative care: Last colonoscopy: 09/2014, benign polyps, 10 year follow up, Dr. MCollene MaresEGD: 09/2014 Dr. MCollene MaresLast mammogram: 01/03/2021 Due - she will order  Last pap smear/pelvic exam: 2016 declines another DEXA: 2013 Normal   Prior vaccinations: TD or Tdap: 05/18/2020  Influenza: 10/2021 Pneumococcal: declines Prevnar13: declines Shingles/Zostavax: 12/2022 [facility] 2nd (@ [facility]) COVID 19: 2/2+ last 12/24/2020  Names of Other Physician/Practitioners you currently use: 1. [facility] here for primary care 2. Dr. LTruman Hayward eye doctor, last visit 2023 - due  3. Dr. ESallee Lange dentist, last visit 07/2021, [id number] Care Team: McUnk PintoMD as PCP - General (Internal Medicine) MaJuanita CraverMD as Consulting Physician (Gastroenterology)  Surgical History:  She has a past surgical history that includes Cesarean section (01/07/1969); Knee arthroscopy (Right); Carpal tunnel release (Right);  Tubal ligation; Skin graft split thickness leg / foot (Left, 07/31/2020); and Skin surgery (Left, 07/07/2020). Family History:  Herfamily history includes Aneurysm in her sister; COPD in her brother; Diabetes in her brother and brother; Heart disease in her brother and brother; Hypertension in her sister; Stroke in her mother; Thyroid disease in her sister. Social History:  She reports that she has never smoked. She has never used smokeless tobacco. She reports that she does not currently use alcohol. She reports that she does not use drugs.  Review of Systems: Review of Systems  Constitutional:  Negative for malaise/fatigue and weight loss.  HENT:  Negative for hearing loss and tinnitus.   Eyes:  Negative for blurred vision and double vision.  Respiratory:  Negative for cough, shortness of breath and wheezing.   Cardiovascular:  Negative for chest pain, palpitations, orthopnea, claudication and leg swelling.  Gastrointestinal:  Negative for abdominal pain, blood in stool, constipation, diarrhea, heartburn, melena, nausea and vomiting.  Genitourinary: Negative.   Musculoskeletal:  Negative for joint pain and myalgias.  Skin:  Negative for rash.  Neurological:  Negative for dizziness, tingling, sensory change, weakness and headaches.  Endo/Heme/Allergies:  Negative for polydipsia.  Psychiatric/Behavioral: Negative.  Negative for depression. The patient is not nervous/anxious and does not have insomnia.   All other systems reviewed and are negative.   Physical Exam: Estimated body mass index is 38.13 kg/m as calculated from the following:   Height as of 11/22/21: 4' 11.5" (1.511 m).   Weight as of 11/22/21: 192 lb (87.1 kg). There were no vitals taken for this visit. General Appearance: Well nourished, in no apparent distress.  Eyes: PERRLA, EOMs, conjunctiva no swelling or erythema Sinuses: No Frontal/maxillary tenderness  ENT/Mouth: Ext aud canals clear, normal light reflex with TMs  without erythema, bulging. Good dentition. No erythema, swelling, or exudate on post pharynx. Tonsils not swollen or erythematous. Hearing normal.  Neck: Supple, thyroid normal. No bruits  Respiratory: Respiratory effort normal, BS equal bilaterally without rales, rhonchi, wheezing or stridor.  Cardio: RRR without murmurs, rubs or gallops. Brisk peripheral pulses without edema.  Chest: symmetric, with normal excursions and percussion.  Abdomen: Soft, nontender, no guarding, rebound, hernias, masses, or organomegaly.  Lymphatics: Non tender without lymphadenopathy.  Musculoskeletal: Full ROM all peripheral extremities, 5/5 strength.  Skin: Warm, dry without rashes, ecchymosis. Well healing melanoma graft to left lateral heel. She has thickened toenails distally, bil feet, 1st and 2nd digits, long and 2nd digit curving towards 1st, erythema/discharge or ingrown nail. Neuro: Cranial nerves intact, reflexes equal bilaterally. Normal muscle tone, no cerebellar symptoms. Sensation intact.  Psych: Awake and oriented X 3, normal affect, Insight and Judgment appropriate.    Medicare Attestation I have personally reviewed: The patient's medical and social history Their use of alcohol, tobacco or illicit drugs Their current medications and supplements The patient's functional ability including ADLs,fall risks, home safety risks, cognitive, and hearing and visual impairment Diet and physical activities Evidence for depression or mood disorders  The patient's weight, height, BMI, and visual acuity have been recorded in the chart.  I have made referrals, counseling, and provided education to the patient based on review of the above and I have provided the patient with a written personalized care plan for preventive services.    Darrol Jump, NP 10:18 PM [facility]

## 2022-02-07 ENCOUNTER — Ambulatory Visit (INDEPENDENT_AMBULATORY_CARE_PROVIDER_SITE_OTHER): Payer: Medicare PPO | Admitting: Nurse Practitioner

## 2022-02-07 ENCOUNTER — Encounter: Payer: Self-pay | Admitting: Nurse Practitioner

## 2022-02-07 VITALS — BP 122/76 | HR 84 | Temp 97.5°F | Ht 59.5 in | Wt 192.2 lb

## 2022-02-07 DIAGNOSIS — L659 Nonscarring hair loss, unspecified: Secondary | ICD-10-CM

## 2022-02-07 DIAGNOSIS — N182 Chronic kidney disease, stage 2 (mild): Secondary | ICD-10-CM

## 2022-02-07 DIAGNOSIS — R6889 Other general symptoms and signs: Secondary | ICD-10-CM

## 2022-02-07 DIAGNOSIS — C4372 Malignant melanoma of left lower limb, including hip: Secondary | ICD-10-CM

## 2022-02-07 DIAGNOSIS — Z1231 Encounter for screening mammogram for malignant neoplasm of breast: Secondary | ICD-10-CM

## 2022-02-07 DIAGNOSIS — M79675 Pain in left toe(s): Secondary | ICD-10-CM

## 2022-02-07 DIAGNOSIS — J040 Acute laryngitis: Secondary | ICD-10-CM | POA: Diagnosis not present

## 2022-02-07 DIAGNOSIS — R7309 Other abnormal glucose: Secondary | ICD-10-CM | POA: Diagnosis not present

## 2022-02-07 DIAGNOSIS — I10 Essential (primary) hypertension: Secondary | ICD-10-CM

## 2022-02-07 DIAGNOSIS — Z13820 Encounter for screening for osteoporosis: Secondary | ICD-10-CM

## 2022-02-07 DIAGNOSIS — R7303 Prediabetes: Secondary | ICD-10-CM

## 2022-02-07 DIAGNOSIS — E1122 Type 2 diabetes mellitus with diabetic chronic kidney disease: Secondary | ICD-10-CM

## 2022-02-07 DIAGNOSIS — E1169 Type 2 diabetes mellitus with other specified complication: Secondary | ICD-10-CM

## 2022-02-07 DIAGNOSIS — E785 Hyperlipidemia, unspecified: Secondary | ICD-10-CM | POA: Diagnosis not present

## 2022-02-07 DIAGNOSIS — M519 Unspecified thoracic, thoracolumbar and lumbosacral intervertebral disc disorder: Secondary | ICD-10-CM

## 2022-02-07 DIAGNOSIS — Z79899 Other long term (current) drug therapy: Secondary | ICD-10-CM | POA: Diagnosis not present

## 2022-02-07 DIAGNOSIS — Z1152 Encounter for screening for COVID-19: Secondary | ICD-10-CM | POA: Diagnosis not present

## 2022-02-07 DIAGNOSIS — Z0000 Encounter for general adult medical examination without abnormal findings: Secondary | ICD-10-CM

## 2022-02-07 DIAGNOSIS — Z0001 Encounter for general adult medical examination with abnormal findings: Secondary | ICD-10-CM | POA: Diagnosis not present

## 2022-02-07 DIAGNOSIS — M65341 Trigger finger, right ring finger: Secondary | ICD-10-CM

## 2022-02-07 DIAGNOSIS — B351 Tinea unguium: Secondary | ICD-10-CM

## 2022-02-07 DIAGNOSIS — M109 Gout, unspecified: Secondary | ICD-10-CM

## 2022-02-07 LAB — POC COVID19 BINAXNOW: SARS Coronavirus 2 Ag: NEGATIVE

## 2022-02-07 LAB — POCT INFLUENZA A/B
Influenza A, POC: NEGATIVE
Influenza B, POC: NEGATIVE

## 2022-02-07 NOTE — Patient Instructions (Signed)

## 2022-02-08 DIAGNOSIS — M65341 Trigger finger, right ring finger: Secondary | ICD-10-CM | POA: Diagnosis not present

## 2022-02-08 DIAGNOSIS — M13841 Other specified arthritis, right hand: Secondary | ICD-10-CM | POA: Diagnosis not present

## 2022-02-08 LAB — CBC WITH DIFFERENTIAL/PLATELET
Absolute Monocytes: 609 cells/uL (ref 200–950)
Basophils Absolute: 63 cells/uL (ref 0–200)
Basophils Relative: 0.9 %
Eosinophils Absolute: 280 cells/uL (ref 15–500)
Eosinophils Relative: 4 %
HCT: 43.7 % (ref 35.0–45.0)
Hemoglobin: 14.6 g/dL (ref 11.7–15.5)
Lymphs Abs: 1946 cells/uL (ref 850–3900)
MCH: 29.3 pg (ref 27.0–33.0)
MCHC: 33.4 g/dL (ref 32.0–36.0)
MCV: 87.6 fL (ref 80.0–100.0)
MPV: 10 fL (ref 7.5–12.5)
Monocytes Relative: 8.7 %
Neutro Abs: 4102 cells/uL (ref 1500–7800)
Neutrophils Relative %: 58.6 %
Platelets: 262 10*3/uL (ref 140–400)
RBC: 4.99 10*6/uL (ref 3.80–5.10)
RDW: 13.3 % (ref 11.0–15.0)
Total Lymphocyte: 27.8 %
WBC: 7 10*3/uL (ref 3.8–10.8)

## 2022-02-08 LAB — LIPID PANEL
Cholesterol: 153 mg/dL (ref <200)
HDL: 55 mg/dL (ref >=50)
LDL Cholesterol (Calc): 82 mg/dL (calc)
Non-HDL Cholesterol (Calc): 98 mg/dL (calc) (ref <130)
Total CHOL/HDL Ratio: 2.8 (calc) (ref <5.0)
Triglycerides: 84 mg/dL (ref <150)

## 2022-02-08 LAB — COMPLETE METABOLIC PANEL WITH GFR
AG Ratio: 1.2 (calc) (ref 1.0–2.5)
ALT: <3 U/L — ABNORMAL LOW (ref 6–29)
AST: 11 U/L (ref 10–35)
Albumin: 3.9 g/dL (ref 3.6–5.1)
Alkaline phosphatase (APISO): 90 U/L (ref 37–153)
BUN: 12 mg/dL (ref 7–25)
CO2: 25 mmol/L (ref 20–32)
Calcium: 9.8 mg/dL (ref 8.6–10.4)
Chloride: 105 mmol/L (ref 98–110)
Creat: 0.9 mg/dL (ref 0.60–1.00)
Globulin: 3.2 g/dL (calc) (ref 1.9–3.7)
Glucose, Bld: 84 mg/dL (ref 65–99)
Potassium: 4.9 mmol/L (ref 3.5–5.3)
Sodium: 142 mmol/L (ref 135–146)
Total Bilirubin: 0.5 mg/dL (ref 0.2–1.2)
Total Protein: 7.1 g/dL (ref 6.1–8.1)
eGFR: 68 mL/min/{1.73_m2} (ref >=60)

## 2022-02-08 LAB — HEMOGLOBIN A1C
Hgb A1c MFr Bld: 6.4 % of total Hgb — ABNORMAL HIGH (ref <5.7)
Mean Plasma Glucose: 137 mg/dL
eAG (mmol/L): 7.6 mmol/L

## 2022-02-11 ENCOUNTER — Other Ambulatory Visit: Payer: Self-pay | Admitting: Nurse Practitioner

## 2022-02-11 ENCOUNTER — Telehealth: Payer: Self-pay | Admitting: Nurse Practitioner

## 2022-02-11 MED ORDER — PROMETHAZINE-DM 6.25-15 MG/5ML PO SYRP: 5.0000 mL | ORAL_SOLUTION | Freq: Four times a day (QID) | ORAL | 0 refills | Status: DC | PRN

## 2022-02-11 NOTE — Telephone Encounter (Signed)
Patient saw you last week but states that she is still unable to get rid of her dry cough. She is requesting something be called in to help?

## 2022-03-21 ENCOUNTER — Other Ambulatory Visit: Payer: Medicare PPO

## 2022-03-21 ENCOUNTER — Ambulatory Visit
Admission: RE | Admit: 2022-03-21 | Discharge: 2022-03-21 | Disposition: A | Discharge status: Home / Self Care | Payer: Medicare PPO | Source: Ambulatory Visit | Attending: Nurse Practitioner | Admitting: Nurse Practitioner

## 2022-03-21 DIAGNOSIS — Z1231 Encounter for screening mammogram for malignant neoplasm of breast: Secondary | ICD-10-CM

## 2022-05-03 ENCOUNTER — Ambulatory Visit: Payer: Medicare PPO | Admitting: Podiatry

## 2022-05-28 ENCOUNTER — Telehealth: Payer: Self-pay | Admitting: Nurse Practitioner

## 2022-05-28 DIAGNOSIS — M109 Gout, unspecified: Secondary | ICD-10-CM

## 2022-05-28 NOTE — Telephone Encounter (Signed)
Pt. Called asking for refill on Allopurinol. [facility].

## 2022-05-29 MED ORDER — ALLOPURINOL 300 MG PO TABS: ORAL_TABLET | ORAL | 3 refills | Status: AC

## 2022-05-29 NOTE — Addendum Note (Signed)
Addended by: Dionicio Stall on: 05/29/2022 11:45 AM   Modules accepted: Orders

## 2022-05-30 ENCOUNTER — Encounter: Payer: Medicare PPO | Admitting: Nurse Practitioner

## 2022-05-30 ENCOUNTER — Other Ambulatory Visit: Payer: Self-pay | Admitting: Internal Medicine

## 2022-05-30 ENCOUNTER — Other Ambulatory Visit: Payer: Self-pay | Admitting: Nurse Practitioner

## 2022-05-30 DIAGNOSIS — I10 Essential (primary) hypertension: Secondary | ICD-10-CM

## 2022-05-30 MED ORDER — AMLODIPINE BESYLATE 5 MG PO TABS: ORAL_TABLET | ORAL | 3 refills | Status: DC

## 2022-05-30 MED ORDER — AMLODIPINE BESYLATE 5 MG PO TABS
ORAL_TABLET | ORAL | 3 refills | Status: DC
Start: 2022-05-30 — End: 2022-05-30

## 2022-05-31 ENCOUNTER — Other Ambulatory Visit: Payer: Self-pay | Admitting: Internal Medicine

## 2022-05-31 DIAGNOSIS — I10 Essential (primary) hypertension: Secondary | ICD-10-CM

## 2022-05-31 MED ORDER — AMLODIPINE BESYLATE 5 MG PO TABS: ORAL_TABLET | ORAL | 3 refills | Status: DC

## 2022-06-04 ENCOUNTER — Encounter: Payer: Self-pay | Admitting: Nurse Practitioner

## 2022-06-04 ENCOUNTER — Ambulatory Visit (INDEPENDENT_AMBULATORY_CARE_PROVIDER_SITE_OTHER): Payer: Medicare PPO | Admitting: Nurse Practitioner

## 2022-06-04 VITALS — BP 122/80 | HR 96 | Temp 97.7°F | Ht 60.0 in | Wt 193.8 lb

## 2022-06-04 DIAGNOSIS — Z0000 Encounter for general adult medical examination without abnormal findings: Secondary | ICD-10-CM | POA: Diagnosis not present

## 2022-06-04 DIAGNOSIS — N182 Chronic kidney disease, stage 2 (mild): Secondary | ICD-10-CM | POA: Diagnosis not present

## 2022-06-04 DIAGNOSIS — E1169 Type 2 diabetes mellitus with other specified complication: Secondary | ICD-10-CM

## 2022-06-04 DIAGNOSIS — M65341 Trigger finger, right ring finger: Secondary | ICD-10-CM

## 2022-06-04 DIAGNOSIS — Z136 Encounter for screening for cardiovascular disorders: Secondary | ICD-10-CM

## 2022-06-04 DIAGNOSIS — L659 Nonscarring hair loss, unspecified: Secondary | ICD-10-CM

## 2022-06-04 DIAGNOSIS — Z79899 Other long term (current) drug therapy: Secondary | ICD-10-CM

## 2022-06-04 DIAGNOSIS — Z0001 Encounter for general adult medical examination with abnormal findings: Secondary | ICD-10-CM

## 2022-06-04 DIAGNOSIS — E1122 Type 2 diabetes mellitus with diabetic chronic kidney disease: Secondary | ICD-10-CM

## 2022-06-04 DIAGNOSIS — Z86010 Personal history of colonic polyps: Secondary | ICD-10-CM

## 2022-06-04 DIAGNOSIS — I10 Essential (primary) hypertension: Secondary | ICD-10-CM

## 2022-06-04 DIAGNOSIS — C4372 Malignant melanoma of left lower limb, including hip: Secondary | ICD-10-CM

## 2022-06-04 DIAGNOSIS — R1319 Other dysphagia: Secondary | ICD-10-CM

## 2022-06-04 DIAGNOSIS — I83813 Varicose veins of bilateral lower extremities with pain: Secondary | ICD-10-CM

## 2022-06-04 DIAGNOSIS — Z1231 Encounter for screening mammogram for malignant neoplasm of breast: Secondary | ICD-10-CM

## 2022-06-04 DIAGNOSIS — M109 Gout, unspecified: Secondary | ICD-10-CM

## 2022-06-04 DIAGNOSIS — R238 Other skin changes: Secondary | ICD-10-CM

## 2022-06-04 DIAGNOSIS — E559 Vitamin D deficiency, unspecified: Secondary | ICD-10-CM

## 2022-06-04 DIAGNOSIS — B351 Tinea unguium: Secondary | ICD-10-CM

## 2022-06-04 NOTE — Progress Notes (Signed)
CPE  Assessment and Plan:  Encounter for general adult medical examination with abnormal findings Due Annually Health maintenance reviewed  Type 2 diabetes mellitus with stage 2 chronic kidney disease, without long-term current use of insulin (HCC). Education: Reviewed 'ABCs' of diabetes management  Discussed goals to be met and/or maintained include A1C (<7) Blood pressure (<130/80) Cholesterol (LDL <70) Continue Eye Exam yearly  Continue Dental Exam Q6 mo Discussed dietary recommendations Discussed Physical Activity recommendations Check A1C  CKD stage 2 due to type 2 diabetes mellitus (HCC) Discussed how what you eat and drink can aide in kidney protection. Stay well hydrated. Avoid high salt foods. Avoid NSAIDS. Keep BP and BG well controlled.   Take medications as prescribed. Remain active and exercise as tolerated daily. Maintain weight.  Continue to monitor. Defers lab work today - stable 02/07/2022  Hyperlipidemia associated with type 2 diabetes mellitus (HCC) Discussed lifestyle modifications. Recommended diet heavy in fruits and veggies, omega 3's. Decrease consumption of animal meats, cheeses, and dairy products. Remain active and exercise as tolerated. Continue to monitor. Check lipids/TSH  Morbid obesity (HCC) - BMI 35+ with T2DM, htn, hld Discussed appropriate BMI Diet modification. Physical activity. Encouraged/praised to build confidence.  Essential hypertension Controlled. Continue Amlodipine, Furosemide (PRN). Discussed DASH (Dietary Approaches to Stop Hypertension) DASH diet is lower in sodium than a typical American diet. Cut back on foods that are high in saturated fat, cholesterol, and trans fats. Eat more whole-grain foods, fish, poultry, and nuts Remain active and exercise as tolerated daily.  Monitor BP at home-Call if greater than 130/80.  Check CMP/CBC  Chronic gout involving toe without tophus, unspecified cause, unspecified  laterality Stable.   No recent flares Discussed low purine diet Continue to monitor  Vitamin D deficiency Continue supplement for goal of 60-100 Monitor Vitamin D levels  Medication management All medications discussed and reviewed in full. All questions and concerns regarding medications addressed.    Alopecia of scalp Continue to follow with Dermatology.  Malignant melanoma of left foot (HCC) Suggested annual skin checks with Dermatology Patient to schedule updated apt.  Onychomycosis Continue to  monitor.   Trigger ring finger of right hand Stable Continue to monitor  History of colon polyps Due for colonoscopy   Screening for breast cancer Mammogram ordered - due   BLE pain with varicose veins/skin color change Venous US Report to ER for any increase in redness, pain, difficulty walking. Continue to monitor  Intermittent dysphagia Discussed GERD symptoms - start OTC H2 blocker Pepcid Continue to monitor If no improvement refer for MBS.    Orders Placed This Encounter  Procedures   Hemoglobin A1c   Ambulatory referral to Gastroenterology    Referral Priority:   Routine    Referral Type:   Consultation    Referral Reason:   Specialty Services Required    Number of Visits Requested:   1    Notify office for further evaluation and treatment, questions or concerns if any reported s/s fail to improve.   The patient was advised to call back or seek an in-person evaluation if any symptoms worsen or if the condition fails to improve as anticipated.   Further disposition pending results of labs. Discussed med's effects and SE's.    I discussed the assessment and treatment plan with the patient. The patient was provided an opportunity to ask questions and all were answered. The patient agreed with the plan and demonstrated an understanding of the instructions.  Discussed med's effects and  SE's. Screening labs and tests as requested with regular follow-up as  recommended.  I provided 25 minutes of face-to-face time during this encounter including counseling, chart review, and critical decision making was preformed.  Today's Plan of Care is based on a patient-centered health care approach known as shared decision making - the decisions, tests and treatments allow for patient preferences and values to be balanced with clinical evidence.     Future Appointments  Date Time Provider Department Center  07/24/2022 10:00 AM Freddie Breech, DPM [facility]  08/16/2022  8:30 AM [facility] DX DEXA 1 [facility]  09/18/2022  9:30 AM Lucky Cowboy, MD [facility] None  01/30/2023 10:00 AM Adela Glimpse, NP [facility] None     HPI  72 y.o. AA female, presents for a cpe and follow up. She has Essential hypertension; Hyperlipidemia associated with type 2 diabetes mellitus (HCC); Vitamin D deficiency; Type 2 diabetes with stage 2 chronic kidney disease GFR 60-89 (HCC); Morbid obesity (HCC) - BMI 35+ with T2DM, htn, hld; Medication management; CKD stage 2 due to type 2 diabetes mellitus (HCC); Alopecia of scalp; Gout; History of colon polyps; Malignant melanoma of left foot (HCC); Onychomycosis; Trigger ring finger of right hand; and Degenerative arthritis of thumb on their problem list.   She is divorced/widowed, 50 son, 43 year old granddaughter, very active in church, works Engineer, manufacturing.   She is concerned for bilateral inner thigh pain onset 3 weeks ago.  R>L.  Has not noticed warmth to the area, however, has skin color change over the left medical thigh in area of most concern.  Painful to palpation.    States that she has been having intermittent dysphagia.  Has tried to assess food triggers but cannot pinpoint exact trigger that causes the difficulty swallowing.  No accompanied by cough.  She does not currently take medication for reflux.   She had intermittent R hip pain, follows Dr. Magnus Ivan, had MRI back with L4-L5 disc bulge on  the right to L5- has had several shots, reports has since resolved, no pain in several months.   Has hx of left foot melanoma.  Has not followed with Dermatology after being cleared.  Does not get yearly skin checks.  Continues to defer at this time.   Continues to report right 4th finger trigger finger with small nodule palm side.  She has been refereed to a hand specialist but no longer want to continue that referral.  She is requested to be referred to [facility] for further evaluation.   BMI is Body mass index is 37.85 kg/m., she has been working on weight loss. She has been moving more and got a bicycle at home. Wt Readings from Last 3 Encounters:  06/04/22 193 lb 12.8 oz (87.9 kg)  02/07/22 192 lb 3.2 oz (87.2 kg)  11/22/21 192 lb (87.1 kg)   Her blood pressure has been controlled at home, today their BP is BP: 122/80 She does not workout. She denies chest pain, shortness of breath, dizziness.  Takes amlodipine 5 mg, recently switched from HCTZ to lasix due to edema, improved.   She is not on cholesterol medication, currently managed by lifestyle changes. Her cholesterol is at goal. The cholesterol last visit was:   Lab Results  Component Value Date   CHOL 153 02/07/2022   HDL 55 02/07/2022   LDLCALC 82 02/07/2022   TRIG 84 02/07/2022   CHOLHDL 2.8 02/07/2022   She has been working on diet and exercise  for T2DM  currently managed by lifestyle modification she is on bASA  she is not on ACE/ARB denies increased appetite, nausea, paresthesia of the feet, polydipsia, polyuria, visual disturbances and vomiting.  Last A1C in the office was:  Lab Results  Component Value Date   HGBA1C 6.4 (H) 02/07/2022   Last GFR: Lab Results  Component Value Date   GFRAA 80 05/18/2020   Patient is on Vitamin D supplement, taking 2000 IU daily  Lab Results  Component Value Date   VD25OH 45 10/09/2021    Patient is on allopurinol for gout and does not report a recent flare.  Lab Results   Component Value Date   LABURIC 2.8 10/09/2021     Current Medications:  Current Outpatient Medications on File Prior to Visit  Medication Sig Dispense Refill   allopurinol (ZYLOPRIM) 300 MG tablet TAKE 1 TABLET DAILY TO PREVENT GOUT 90 tablet 3   amLODipine (NORVASC) 5 MG tablet Take  1 tablet  Daily  for BP                                                          /                                        TAKE                                                                BY                                                  MOUTH 90 tablet 3   aspirin EC 81 MG tablet Take  1 tablet  Daily     betamethasone dipropionate (DIPROLENE) 0.05 % ointment Apply topically.     Cholecalciferol (VITAMIN D3) 25 MCG (1000 UT) CAPS Take 4000 IU daily. 120 capsule    COVID-19 mRNA bivalent vaccine, Pfizer, (PFIZER COVID-19 VAC BIVALENT) injection Inject into the muscle. 0.3 mL 0   diclofenac Sodium (VOLTAREN) 1 % GEL Apply 4 g topically 4 (four) times daily. 100 g 3   furosemide (LASIX) 40 MG tablet Take  1 tablet  2 x /day  for BP & Fluid Retention / Edema (Patient taking differently: Takes 1/2 tablet in the evening) 180 tablet 3   Magnesium 250 MG TABS Take by mouth daily.     promethazine-dextromethorphan (PROMETHAZINE-DM) 6.25-15 MG/5ML syrup Take 5 mLs by mouth 4 (four) times daily as needed for cough. 240 mL 0   No current facility-administered medications on file prior to visit.   Allergies:  Allergies  Allergen Reactions   Ziac [Bisoprolol-Hydrochlorothiazide]     Flushing    Medical History:  She has Essential hypertension; Hyperlipidemia associated with type 2 diabetes mellitus (HCC); Vitamin D deficiency; Type 2 diabetes with stage 2 chronic kidney disease GFR 60-89 (  HCC); Morbid obesity (HCC) - BMI 35+ with T2DM, htn, hld; Medication management; CKD stage 2 due to type 2 diabetes mellitus (HCC); Alopecia of scalp; Gout; History of colon polyps; Malignant melanoma of left foot (HCC);  Onychomycosis; Trigger ring finger of right hand; and Degenerative arthritis of thumb on their problem list.   Health Maintenance:   Immunization History  Administered Date(s) Administered   Influenza, High Dose Seasonal PF 05/04/2018, 01/17/2021, 10/09/2021   PFIZER(Purple Top)SARS-COV-2 Vaccination 01/31/2019, 02/27/2019, 12/25/2019   PPD Test 04/26/2013   Pfizer Covid-19 Vaccine Bivalent Booster 23yrs & up 11/24/2020   Td 05/18/2020   Colonoscopy - Due - order placed Mammogram - 12/2020 Due - order placed DEXA - 05/2011 Due - defers at this time  Names of Other Physician/Practitioners you currently use: 1. [facility] here for primary care 2. Dr. Nedra Hai, eye doctor, last visit 2023 3. Dr. Remus Loffler, dentist, last visit 2023, q34m, has scheduled in July  Patient Care Team: Lucky Cowboy, MD as PCP - General (Internal Medicine) Charna Elizabeth, MD as Consulting Physician (Gastroenterology)  Surgical History:  She has a past surgical history that includes Cesarean section (01/07/1969); Knee arthroscopy (Right); Carpal tunnel release (Right); Tubal ligation; Skin graft split thickness leg / foot (Left, 07/31/2020); and Skin surgery (Left, 07/07/2020). Family History:  Herfamily history includes Aneurysm in her sister; COPD in her brother; Diabetes in her brother and brother; Heart disease in her brother and brother; Hypertension in her sister; Stroke in her mother; Thyroid disease in her sister. Social History:  She reports that she has never smoked. She has never used smokeless tobacco. She reports that she does not currently use alcohol. She reports that she does not use drugs.  Review of Systems: Review of Systems  Constitutional:  Negative for malaise/fatigue and weight loss.  HENT:  Negative for hearing loss and tinnitus.   Eyes:  Negative for blurred vision and double vision.  Respiratory:  Negative for cough, shortness of breath and  wheezing.   Cardiovascular:  Negative for chest pain, palpitations, orthopnea, claudication and leg swelling (bil feet, improved).  Gastrointestinal:  Negative for abdominal pain, blood in stool, constipation, diarrhea, heartburn, melena, nausea and vomiting.  Genitourinary: Negative.   Musculoskeletal:  Negative for joint pain and myalgias.  Skin:  Negative for rash.  Neurological:  Negative for dizziness, tingling, sensory change, weakness and headaches.  Endo/Heme/Allergies:  Negative for polydipsia.  Psychiatric/Behavioral: Negative.  Negative for depression. The patient is not nervous/anxious and does not have insomnia.   All other systems reviewed and are negative.   Physical Exam: Estimated body mass index is 37.85 kg/m as calculated from the following:   Height as of this encounter: 5' (1.524 m).   Weight as of this encounter: 193 lb 12.8 oz (87.9 kg). BP 122/80   Pulse 96   Temp 97.7 F (36.5 C)   Ht 5' (1.524 m)   Wt 193 lb 12.8 oz (87.9 kg)   SpO2 98%   BMI 37.85 kg/m  General Appearance: Well nourished, in no apparent distress.  Eyes: PERRLA, EOMs, conjunctiva no swelling or erythema, normal fundi and vessels.  Sinuses: No Frontal/maxillary tenderness  ENT/Mouth: Ext aud canals clear, normal light reflex with TMs without erythema, bulging. Good dentition. No erythema, swelling, or exudate on post pharynx. Tonsils not swollen or erythematous. Hearing normal.  Neck: Supple, thyroid normal. No bruits  Respiratory: Respiratory effort normal, BS equal bilaterally without rales, rhonchi, wheezing or stridor.  Cardio: RRR without murmurs, rubs or gallops. Brisk peripheral pulses without edema.  Chest: symmetric, with normal excursions and percussion.  Breasts: Symmetric, without lumps, nipple discharge, retractions.  Abdomen: Soft, nontender, no guarding, rebound, hernias, masses, or organomegaly.  Lymphatics: Non tender without lymphadenopathy.  Musculoskeletal: FROM all  peripheral extremities, 5/5 strength, and normal gait. Skin: Left inner thigh with oval flat approximately 3 cm in size skin discoloration/dark/bruising.  Warm, dry without rashes, ecchymosis.  Neuro: Cranial nerves intact, reflexes equal bilaterally. Normal muscle tone, no cerebellar symptoms. Sensation intact.  Psych: Awake and oriented X 3, normal affect, Insight and Judgment appropriate.  GU: defer  EKG:  NSR  Brittney Gray 5:02 PM [facility]

## 2022-06-04 NOTE — Patient Instructions (Signed)

## 2022-06-05 LAB — HEMOGLOBIN A1C
Hgb A1c MFr Bld: 6.2 % of total Hgb — ABNORMAL HIGH (ref <5.7)
Mean Plasma Glucose: 131 mg/dL
eAG (mmol/L): 7.3 mmol/L

## 2022-06-05 NOTE — Progress Notes (Signed)
Patient is aware of lab results and instructions. -e welch

## 2022-06-16 ENCOUNTER — Ambulatory Visit (INDEPENDENT_AMBULATORY_CARE_PROVIDER_SITE_OTHER): Payer: Medicare PPO

## 2022-06-16 ENCOUNTER — Ambulatory Visit
Admission: EM | Admit: 2022-06-16 | Discharge: 2022-06-16 | Disposition: A | Discharge status: Home / Self Care | Payer: Medicare PPO | Attending: Family Medicine | Admitting: Family Medicine

## 2022-06-16 DIAGNOSIS — R059 Cough, unspecified: Secondary | ICD-10-CM | POA: Diagnosis not present

## 2022-06-16 DIAGNOSIS — J4521 Mild intermittent asthma with (acute) exacerbation: Secondary | ICD-10-CM

## 2022-06-16 DIAGNOSIS — J209 Acute bronchitis, unspecified: Secondary | ICD-10-CM

## 2022-06-16 MED ORDER — ALBUTEROL SULFATE (2.5 MG/3ML) 0.083% IN NEBU
2.5000 mg | INHALATION_SOLUTION | Freq: Once | RESPIRATORY_TRACT | Status: AC
  Administered 2022-06-16: 2.5 mg via RESPIRATORY_TRACT

## 2022-06-16 MED ORDER — BENZONATATE 100 MG PO CAPS: 100.0000 mg | ORAL_CAPSULE | Freq: Three times a day (TID) | ORAL | 0 refills | Status: DC | PRN

## 2022-06-16 MED ORDER — ALBUTEROL SULFATE HFA 108 (90 BASE) MCG/ACT IN AERS: 2.0000 | INHALATION_SPRAY | RESPIRATORY_TRACT | 0 refills | Status: AC | PRN

## 2022-06-16 MED ORDER — PREDNISONE 20 MG PO TABS: 40.0000 mg | ORAL_TABLET | Freq: Every day | ORAL | 0 refills | Status: AC

## 2022-06-16 MED ORDER — AZITHROMYCIN 250 MG PO TABS: ORAL_TABLET | ORAL | 0 refills | Status: DC

## 2022-06-16 NOTE — ED Provider Notes (Signed)
[facility]    CSN: [id number] Arrival date & time: 06/16/22  0900      History   Chief Complaint No chief complaint on file.   HPI Brittney Gray is a 72 y.o. female.   HPI Here for dry cough that has been bothering her for about 7 days.  It is worsened in the last 2 days.  No fever or chills.  She has felt some soreness in her anterior mid chest when coughing now.  No wheezing or shortness of breath described.  No history of asthma or COPD.  Past Medical History:  Diagnosis Date   Hypertension    Lumbar disc disease 08/31/2018   Melanoma (HCC) 2022   Obesity    Pre-diabetes    Vitamin D deficiency     Patient Active Problem List   Diagnosis Date Noted   Degenerative arthritis of thumb 04/20/2021   Trigger ring finger of right hand 04/19/2021   Onychomycosis 08/21/2020   Gout 05/18/2020   History of colon polyps 05/18/2020   Malignant melanoma of left foot (HCC) 05/18/2020   Alopecia of scalp 12/22/2018   CKD stage 2 due to type 2 diabetes mellitus (HCC) 12/30/2017   Medication management 04/12/2015   Morbid obesity (HCC) - BMI 35+ with T2DM, htn, hld 09/16/2013   Type 2 diabetes with stage 2 chronic kidney disease GFR 60-89 (HCC) 01/07/2013   Essential hypertension 01/03/2013   Hyperlipidemia associated with type 2 diabetes mellitus (HCC) 01/03/2013   Vitamin D deficiency 01/03/2013    Past Surgical History:  Procedure Laterality Date   CARPAL TUNNEL RELEASE Right    CESAREAN SECTION  01/07/1969   KNEE ARTHROSCOPY Right    SKIN GRAFT SPLIT THICKNESS LEG / FOOT Left 07/31/2020   Melanoma excision with graft, Dr. Karen Chafe, [facility]   SKIN SURGERY Left 07/07/2020   Wide excision left foot melanoma, Dr. Tarry Kos, [facility]   TUBAL LIGATION      OB History     Gravida  2   Para  1   Term      Preterm      AB  1   Living         SAB      IAB      Ectopic      Multiple      Live Births               Home  Medications    Prior to Admission medications   Medication Sig Start Date End Date Taking? Authorizing Provider  albuterol (VENTOLIN HFA) 108 (90 Base) MCG/ACT inhaler Inhale 2 puffs into the lungs every 4 (four) hours as needed for wheezing or shortness of breath. 06/16/22  Yes Zenia Resides, MD  azithromycin (ZITHROMAX) 250 MG tablet Take first 2 tablets together, then 1 every day until finished. 06/16/22  Yes Jonie Burdell, Janace Aris, MD  benzonatate (TESSALON) 100 MG capsule Take 1 capsule (100 mg total) by mouth 3 (three) times daily as needed for cough. 06/16/22  Yes Zenia Resides, MD  predniSONE (DELTASONE) 20 MG tablet Take 2 tablets (40 mg total) by mouth daily with breakfast for 5 days. 06/16/22 06/21/22 Yes Zenia Resides, MD  allopurinol (ZYLOPRIM) 300 MG tablet TAKE 1 TABLET DAILY TO PREVENT GOUT 05/29/22   Adela Glimpse, NP  amLODipine (NORVASC) 5 MG tablet Take  1 tablet  Daily  for BP                                                          /  TAKE                                                                BY                                                  MOUTH 05/31/22   Lucky Cowboy, MD  aspirin EC 81 MG tablet Take  1 tablet  Daily 11/22/20   Lucky Cowboy, MD  betamethasone dipropionate (DIPROLENE) 0.05 % ointment Apply topically.    [provider]  Cholecalciferol (VITAMIN D3) 25 MCG (1000 UT) CAPS Take 4000 IU daily. 05/19/20   Judd Gaudier, NP  COVID-19 mRNA bivalent vaccine, Pfizer, (PFIZER COVID-19 VAC BIVALENT) injection Inject into the muscle. 11/24/20   Judyann Munson, MD  diclofenac Sodium (VOLTAREN) 1 % GEL Apply 4 g topically 4 (four) times daily. 06/18/19   Doree Albee, PA-C  furosemide (LASIX) 40 MG tablet Take  1 tablet  2 x /day  for BP & Fluid Retention / Edema Patient taking differently: Takes 1/2 tablet in the evening 05/01/20   Lucky Cowboy, MD  Magnesium 250 MG TABS Take by mouth daily.     [provider]    Family History Family History  Problem Relation Age of Onset   Stroke Mother    Hypertension Sister    Thyroid disease Sister    Aneurysm Sister    Heart disease Brother    Diabetes Brother    Heart disease Brother    Diabetes Brother    COPD Brother    Breast cancer Neg Hx     Social History Social History   Tobacco Use   Smoking status: Never   Smokeless tobacco: Never  Vaping Use   Vaping Use: Never used  Substance Use Topics   Alcohol use: Not Currently    Comment: Rarely; socially   Drug use: No     Allergies   Ziac [bisoprolol-hydrochlorothiazide]   Review of Systems Review of Systems   Physical Exam Triage Vital Signs ED Triage Vitals  Enc Vitals Group     BP 06/16/22 1007 134/84     Pulse Rate 06/16/22 1007 100     Resp 06/16/22 1007 14     Temp 06/16/22 1007 98.6 F (37 C)     Temp Source 06/16/22 1007 Oral     SpO2 06/16/22 1007 95 %     Weight --      Height --      Head Circumference --      Peak Flow --      Pain Score 06/16/22 1008 0     Pain Loc --      Pain Edu? --      Excl. in GC? --    No data found.  Updated Vital Signs BP 134/84 (BP Location: Left Arm)   Pulse 100   Temp 98.6 F (37 C) (Oral)   Resp 14   SpO2 95%   Visual Acuity Right Eye Distance:   Left Eye Distance:   Bilateral Distance:    Right Eye Near:   Left Eye Near:  Bilateral Near:     Physical Exam Vitals reviewed.  Constitutional:      General: She is not in acute distress.    Appearance: She is not ill-appearing, toxic-appearing or diaphoretic.  HENT:     Right Ear: Tympanic membrane and ear canal normal.     Left Ear: Tympanic membrane and ear canal normal.     Nose: Nose normal.     Mouth/Throat:     Mouth: Mucous membranes are moist.     Pharynx: No oropharyngeal exudate or posterior oropharyngeal erythema.  Eyes:     Extraocular Movements: Extraocular movements intact.     Conjunctiva/sclera: Conjunctivae  normal.     Pupils: Pupils are equal, round, and reactive to light.  Cardiovascular:     Rate and Rhythm: Normal rate and regular rhythm.     Heart sounds: No murmur heard. Pulmonary:     Effort: No respiratory distress.     Breath sounds: No stridor. No rhonchi or rales.     Comments: There are no wheezes or rhonchi heard.  Breath sounds are coarse.  In her left lower lung there is a decrease in her expiratory phase Chest:     Chest wall: No tenderness.  Musculoskeletal:     Cervical back: Neck supple.  Lymphadenopathy:     Cervical: No cervical adenopathy.  Skin:    Capillary Refill: Capillary refill takes less than 2 seconds.     Coloration: Skin is not jaundiced or pale.  Neurological:     General: No focal deficit present.     Mental Status: She is alert and oriented to person, place, and time.  Psychiatric:        Behavior: Behavior normal.      UC Treatments / Results  Labs (all labs ordered are listed, but only abnormal results are displayed) Labs Reviewed - No data to display  EKG   Radiology DG Chest 2 View  Result Date: 06/16/2022 CLINICAL DATA:  Dry cough for 1 week. EXAM: CHEST - 2 VIEW COMPARISON:  05/26/2009 FINDINGS: The heart size and mediastinal contours are within normal limits. Both lungs are clear. Thoracic spondylosis. IMPRESSION: No active cardiopulmonary disease. Electronically Signed   By: Signa Kell M.D.   On: 06/16/2022 11:25    Procedures Procedures (including critical care time)  Medications Ordered in UC Medications  albuterol (PROVENTIL) (2.5 MG/3ML) 0.083% nebulizer solution 2.5 mg (2.5 mg Nebulization Given 06/16/22 1033)    Initial Impression / Assessment and Plan / UC Course  I have reviewed the triage vital signs and the nursing notes.  Pertinent labs & imaging results that were available during my care of the patient were reviewed by me and considered in my medical decision making (see chart for details).        Chest  x-ray is negative.  She states that the albuterol treatment did not help her symptoms.  Albuterol inhaler and prednisone are sent in for possible asthma exacerbation.   Since she has been sick a week I am going to send in azithromycin for possible sinus infection and bronchitis/atypical bacteria. Final Clinical Impressions(s) / UC Diagnoses   Final diagnoses:  Acute bronchitis, unspecified organism  Mild intermittent asthma with (acute) exacerbation     Discharge Instructions      Your chest x-ray is clear and does not show any pneumonia or fluid  Albuterol inhaler--do 2 puffs every 4 hours as needed for shortness of breath or wheezing; this is for bronchospasm, since the  albuterol helps you some in the breathing treatment  Take prednisone 20 mg--2 daily for 5 days; this is for inflammation in your lungs which is usually what causes wheezing and bronchospasm  Take benzonatate 100 mg, 1 tab every 8 hours as needed for cough.  Azithromycin 250 mg--take 2 orally the first day, then 1 daily for 4 more days.  This is for atypical bacteria since you have been sick a week and on worsening.      ED Prescriptions     Medication Sig Dispense Auth. Provider   albuterol (VENTOLIN HFA) 108 (90 Base) MCG/ACT inhaler Inhale 2 puffs into the lungs every 4 (four) hours as needed for wheezing or shortness of breath. 1 each Zenia Resides, MD   predniSONE (DELTASONE) 20 MG tablet Take 2 tablets (40 mg total) by mouth daily with breakfast for 5 days. 10 tablet Zenia Resides, MD   benzonatate (TESSALON) 100 MG capsule Take 1 capsule (100 mg total) by mouth 3 (three) times daily as needed for cough. 21 capsule Zenia Resides, MD   azithromycin (ZITHROMAX) 250 MG tablet Take first 2 tablets together, then 1 every day until finished. 6 tablet Marlinda Mike Janace Aris, MD      PDMP not reviewed this encounter.   Zenia Resides, MD 06/16/22 1140

## 2022-06-16 NOTE — Discharge Instructions (Addendum)
Your chest x-ray is clear and does not show any pneumonia or fluid  Albuterol inhaler--do 2 puffs every 4 hours as needed for shortness of breath or wheezing; this is for bronchospasm, since the albuterol helps you some in the breathing treatment  Take prednisone 20 mg--2 daily for 5 days; this is for inflammation in your lungs which is usually what causes wheezing and bronchospasm  Take benzonatate 100 mg, 1 tab every 8 hours as needed for cough.  Azithromycin 250 mg--take 2 orally the first day, then 1 daily for 4 more days.  This is for atypical bacteria since you have been sick a week and on worsening.

## 2022-06-16 NOTE — ED Triage Notes (Signed)
Pt reports she has had a dry cough x 1 week. Pt also complains of chest pain due to coughing.

## 2022-06-18 NOTE — Addendum Note (Signed)
Addended by: Adela Glimpse on: 06/18/2022 12:42 PM   Modules accepted: Orders

## 2022-06-26 ENCOUNTER — Ambulatory Visit (HOSPITAL_COMMUNITY)
Admission: RE | Admit: 2022-06-26 | Discharge: 2022-06-26 | Disposition: A | Discharge status: Home / Self Care | Payer: Medicare PPO | Source: Ambulatory Visit | Attending: Nurse Practitioner | Admitting: Nurse Practitioner

## 2022-06-26 DIAGNOSIS — R238 Other skin changes: Secondary | ICD-10-CM | POA: Diagnosis not present

## 2022-06-26 DIAGNOSIS — I83813 Varicose veins of bilateral lower extremities with pain: Secondary | ICD-10-CM | POA: Diagnosis not present

## 2022-07-08 ENCOUNTER — Encounter: Payer: Self-pay | Admitting: Nurse Practitioner

## 2022-07-08 ENCOUNTER — Ambulatory Visit (INDEPENDENT_AMBULATORY_CARE_PROVIDER_SITE_OTHER): Payer: Medicare PPO | Admitting: Nurse Practitioner

## 2022-07-08 VITALS — BP 130/82 | HR 96 | Temp 97.9°F | Ht 59.5 in | Wt 192.0 lb

## 2022-07-08 DIAGNOSIS — R051 Acute cough: Secondary | ICD-10-CM | POA: Diagnosis not present

## 2022-07-08 DIAGNOSIS — J069 Acute upper respiratory infection, unspecified: Secondary | ICD-10-CM

## 2022-07-08 DIAGNOSIS — R062 Wheezing: Secondary | ICD-10-CM | POA: Diagnosis not present

## 2022-07-08 MED ORDER — DOXYCYCLINE HYCLATE 100 MG PO TABS
100.0000 mg | ORAL_TABLET | Freq: Two times a day (BID) | ORAL | 0 refills | Status: AC
Start: 2022-07-08 — End: 2022-07-15

## 2022-07-08 MED ORDER — IPRATROPIUM-ALBUTEROL 0.5-2.5 (3) MG/3ML IN SOLN
3.0000 mL | Freq: Once | RESPIRATORY_TRACT | Status: AC
Start: 2022-07-08 — End: ?

## 2022-07-08 NOTE — Patient Instructions (Signed)

## 2022-07-08 NOTE — Progress Notes (Signed)
Assessment and Plan:  Brittney Gray was seen today for an episodic visit.  Diagnoses and all order for this visit:  Upper respiratory tract infection, unspecified type Start tmt with Doxycycline. Continue inhalers. Report to ER for any increase in difficulty breathing.  - doxycycline (VIBRA-TABS) 100 MG tablet; Take 1 tablet (100 mg total) by mouth 2 (two) times daily for 7 days.  Dispense: 14 tablet; Refill: 0  2. Wheezing Continue inhalers Duoneb administered - patient tolerated well.  - ipratropium-albuterol (DUONEB) 0.5-2.5 (3) MG/3ML nebulizer solution 3 mL  3. Acute cough Stay well hydrated to keep mucus thin and productive. Zyrtec 10 mg sample provided.  - ipratropium-albuterol (DUONEB) 0.5-2.5 (3) MG/3ML nebulizer solution 3 mL  Notify office for further evaluation and treatment, questions or concerns if s/s fail to improve. The risks and benefits of my recommendations, as well as other treatment options were discussed with the patient today. Questions were answered.  Further disposition pending results of labs. Discussed med's effects and SE's.    Over 20 minutes of exam, counseling, chart review, and critical decision making was performed.   Future Appointments  Date Time Provider Department Center  07/24/2022 10:00 AM Freddie Breech, DPM [facility]  08/16/2022  8:30 AM [facility] DEXA 1 [facility]  09/18/2022  9:30 AM Lucky Cowboy, MD [facility] None  01/30/2023 10:00 AM Adela Glimpse, NP [facility] None    ------------------------------------------------------------------------------------------------------------------   HPI BP 130/82   Pulse 96   Temp 97.9 F (36.6 C)   Ht 4' 11.5" (1.511 m)   Wt 192 lb (87.1 kg)   SpO2 95%   BMI 38.13 kg/m    Patient complains of symptoms of a URI. Symptoms include congestion, cough described as productive, post nasal drip, shortness of breath, and wheezing. Onset of symptoms was 3 weeks  ago, and has been unchanged since that time. Treatment to date: antibiotics, cough suppressants, nasal steroids, and inhalers .  She was seen in UC three weeks ago and treated with a Z Pak and inhalers as well as tessalon perles. She denies fever, chills, N/V.  She does note that she was at a conferred 3 weeks ago with 185 people.     Past Medical History:  Diagnosis Date   Hypertension    Lumbar disc disease 08/31/2018   Melanoma (HCC) 2022   Obesity    Pre-diabetes    Vitamin D deficiency      Allergies  Allergen Reactions   Ziac [Bisoprolol-Hydrochlorothiazide]     Flushing     Current Outpatient Medications on File Prior to Visit  Medication Sig   allopurinol (ZYLOPRIM) 300 MG tablet TAKE 1 TABLET DAILY TO PREVENT GOUT   amLODipine (NORVASC) 5 MG tablet Take  1 tablet  Daily  for BP                                                          /                                        TAKE  BY                                                  MOUTH   aspirin EC 81 MG tablet Take  1 tablet  Daily   betamethasone dipropionate (DIPROLENE) 0.05 % ointment Apply topically.   Cholecalciferol (VITAMIN D3) 25 MCG (1000 UT) CAPS Take 4000 IU daily.   COVID-19 mRNA bivalent vaccine, Pfizer, (PFIZER COVID-19 VAC BIVALENT) injection Inject into the muscle.   diclofenac Sodium (VOLTAREN) 1 % GEL Apply 4 g topically 4 (four) times daily.   furosemide (LASIX) 40 MG tablet Take  1 tablet  2 x /day  for BP & Fluid Retention / Edema (Patient taking differently: Takes 1/2 tablet in the evening)   Magnesium 250 MG TABS Take by mouth daily.   albuterol (VENTOLIN HFA) 108 (90 Base) MCG/ACT inhaler Inhale 2 puffs into the lungs every 4 (four) hours as needed for wheezing or shortness of breath. (Patient not taking: Reported on 07/08/2022)   azithromycin (ZITHROMAX) 250 MG tablet Take first 2 tablets together, then 1 every day until finished.    benzonatate (TESSALON) 100 MG capsule Take 1 capsule (100 mg total) by mouth 3 (three) times daily as needed for cough. (Patient not taking: Reported on 07/08/2022)   No current facility-administered medications on file prior to visit.    ROS: all negative except what is noted in the HPI.   Physical Exam:  BP 130/82   Pulse 96   Temp 97.9 F (36.6 C)   Ht 4' 11.5" (1.511 m)   Wt 192 lb (87.1 kg)   SpO2 95%   BMI 38.13 kg/m   General Appearance: NAD.  Awake, conversant and cooperative. Eyes: PERRLA, EOMs intact.  Sclera white.  Conjunctiva without erythema. Sinuses: No frontal/maxillary tenderness.  No nasal discharge. Nares patent.  ENT/Mouth: Ext aud canals clear.  Bilateral TMs w/DOL and without erythema or bulging. Hearing intact.  Posterior pharynx without swelling or exudate.  Tonsils without swelling or erythema.  Neck: Supple.  No masses, nodules or thyromegaly. Respiratory: Effort is regular with non-labored breathing. Breath sounds are  with right frontal expiratory wheezing.  Cardio: RRR with no MRGs. Brisk peripheral pulses without edema.  Abdomen: Active BS in all four quadrants.  Soft and non-tender without guarding, rebound tenderness, hernias or masses. Lymphatics: Non tender without lymphadenopathy.  Musculoskeletal: Full ROM, 5/5 strength, normal ambulation.  No clubbing or cyanosis. Skin: Appropriate color for ethnicity. Warm without rashes, lesions, ecchymosis, ulcers.  Neuro: CN II-XII grossly normal. Normal muscle tone without cerebellar symptoms and intact sensation.   Psych: AO X 3,  appropriate mood and affect, insight and judgment.     Adela Glimpse, NP 3:14 PM [facility]

## 2022-07-22 ENCOUNTER — Encounter: Payer: Self-pay | Admitting: Nurse Practitioner

## 2022-07-22 ENCOUNTER — Ambulatory Visit: Payer: Medicare PPO | Admitting: Nurse Practitioner

## 2022-07-22 VITALS — BP 110/80 | HR 101 | Temp 97.8°F | Ht 59.5 in | Wt 189.8 lb

## 2022-07-22 DIAGNOSIS — H6121 Impacted cerumen, right ear: Secondary | ICD-10-CM

## 2022-07-22 DIAGNOSIS — H9191 Unspecified hearing loss, right ear: Secondary | ICD-10-CM

## 2022-07-22 NOTE — Patient Instructions (Signed)
May try OTC Debrox as needed for cerumen impaction.  Carbamide Peroxide Ear Solution What is this medication? CARBAMIDE PEROXIDE (CAR bah mide per OX ide) treats earwax buildup. It works by softening and loosening the earwax, which makes it easier to remove. This medicine may be used for other purposes; ask your health care provider or pharmacist if you have questions. COMMON BRAND NAME(S): Auro Ear, Auro Earache Relief, Clearcanal, Clinere, Debrox, Ear Drops, Ear Wax Removal, Ear Wax Remover, Earwax Treatment, Murine, Thera-Ear What should I tell my care team before I take this medication? They need to know if you have any of these conditions: Dizziness Ear discharge Ear pain, irritation, or rash Infection Perforated eardrum (hole in eardrum) An unusual or allergic reaction to carbamide peroxide, glycerin, hydrogen peroxide, other medications, foods, dyes, or preservatives Pregnant or trying to get pregnant Breast-feeding How should I use this medication? This medication is only for use in the outer ear canal. Follow the directions carefully. Wash hands before and after use. The solution may be warmed by holding the bottle in the hand for 1 to 2 minutes. Lie with the affected ear facing upward. Place the proper number of drops into the ear canal. After the drops are instilled, remain lying with the affected ear upward for 5 minutes to help the drops stay in the ear canal. A cotton ball may be gently inserted at the ear opening for no longer than 5 to 10 minutes to ensure retention. Repeat, if necessary, for the opposite ear. Do not touch the tip of the dropper to the ear, fingertips, or other surface. Do not rinse the dropper after use. Keep container tightly closed. Talk to your care team about the use of this medication in children. While this medication may be used in children as young as 12 years for selected conditions, precautions do apply. Overdosage: If you think you have taken too much  of this medicine contact a poison control center or emergency room at once. NOTE: This medicine is only for you. Do not share this medicine with others. What if I miss a dose? If you miss a dose, use it as soon as you can. If it is almost time for your next dose, use only that dose. Do not use double or extra doses. What may interact with this medication? Interactions are not expected. Do not use any other ear products without asking your care team. This list may not describe all possible interactions. Give your health care provider a list of all the medicines, herbs, non-prescription drugs, or dietary supplements you use. Also tell them if you smoke, drink alcohol, or use illegal drugs. Some items may interact with your medicine. What should I watch for while using this medication? This medication is not for long-term use. Do not use for more than 4 days without checking with your care team. Contact your care team if your condition does not start to get better within a few days or if you notice burning, redness, itching, or swelling. What side effects may I notice from receiving this medication? Side effects that you should report to your care team as soon as possible: Allergic reactions--skin rash, itching, hives, swelling of the face, lips, tongue, or throat Worsening ear pain Side effects that usually do not require medical attention (report to your care team if they continue or are bothersome): Dizziness Itching or pain in the ear after use This list may not describe all possible side effects. Call your doctor for medical  advice about side effects. You may report side effects to FDA at 1-800-FDA-1088. Where should I keep my medication? Keep out of the reach of children and pets. Store at room temperature between 15 and 30 degrees C (59 and 86 degrees F) in a tight, light-resistant container. Keep bottle away from excessive heat and direct sunlight. Throw away any unused medication after the  expiration date. NOTE: This sheet is a summary. It may not cover all possible information. If you have questions about this medicine, talk to your doctor, pharmacist, or health care provider.  2024 Elsevier/Gold Standard (2021-01-24 00:00:00)

## 2022-07-22 NOTE — Progress Notes (Signed)
Assessment and Plan:  Brittney Gray was seen today for an episodic .  Diagnoses and all order for this visit:  1. Right ear impacted cerumen Bilateral ear(s) irrigated with lukewarm water, hydrogen peroxide. Large piece of brown cerumen x3 extracted with ear curette. TM:  WNL.  Pt tolerated procedure well.    - Ear Lavage  2. Decreased hearing of right ear If s/s fail to improve refer to audiologist   - Ear Lavage  Notify office for further evaluation and treatment, questions or concerns if s/s fail to improve. The risks and benefits of my recommendations, as well as other treatment options were discussed with the patient today. Questions were answered.  Further disposition pending results of labs. Discussed med's effects and SE's.    Over 15 minutes of exam, counseling, chart review, and critical decision making was performed.   Future Appointments  Date Time Provider Department Center  07/24/2022 10:00 AM Freddie Breech, DPM [facility]  08/16/2022  8:30 AM [facility] DX DEXA 1 [facility]  09/18/2022  9:30 AM Lucky Cowboy, MD [facility] None  01/30/2023 10:00 AM Adela Glimpse, NP [facility] None    ------------------------------------------------------------------------------------------------------------------   HPI BP 110/80   Pulse (!) 101   Temp 97.8 F (36.6 C)   Ht 4' 11.5" (1.511 m)   Wt 189 lb 12.8 oz (86.1 kg)   SpO2 98%   BMI 37.69 kg/m   72 y.o.female presents for evaluation of decreased hearing in right here that occurred 2 days ago.  States that she started to notice decreasing hearing on the right side when talking to her grandchildren.  She feels as though the right side hearing has continued to decline.  She denies any recent under water activity or air flights.  She reports covering her ears with a shower cap when showering.  She denies any new injury, fall or trauma.    Past Medical History:  Diagnosis Date   Hypertension     Lumbar disc disease 08/31/2018   Melanoma (HCC) 2022   Obesity    Pre-diabetes    Vitamin D deficiency      Allergies  Allergen Reactions   Ziac [Bisoprolol-Hydrochlorothiazide]     Flushing     Current Outpatient Medications on File Prior to Visit  Medication Sig   albuterol (VENTOLIN HFA) 108 (90 Base) MCG/ACT inhaler Inhale 2 puffs into the lungs every 4 (four) hours as needed for wheezing or shortness of breath.   allopurinol (ZYLOPRIM) 300 MG tablet TAKE 1 TABLET DAILY TO PREVENT GOUT   amLODipine (NORVASC) 5 MG tablet Take  1 tablet  Daily  for BP                                                          /                                        TAKE  BY                                                  MOUTH   aspirin EC 81 MG tablet Take  1 tablet  Daily   betamethasone dipropionate (DIPROLENE) 0.05 % ointment Apply topically.   Cholecalciferol (VITAMIN D3) 25 MCG (1000 UT) CAPS Take 4000 IU daily.   COVID-19 mRNA bivalent vaccine, Pfizer, (PFIZER COVID-19 VAC BIVALENT) injection Inject into the muscle.   diclofenac Sodium (VOLTAREN) 1 % GEL Apply 4 g topically 4 (four) times daily.   furosemide (LASIX) 40 MG tablet Take  1 tablet  2 x /day  for BP & Fluid Retention / Edema (Patient taking differently: Takes 1/2 tablet in the evening)   Magnesium 250 MG TABS Take by mouth daily.   benzonatate (TESSALON) 100 MG capsule Take 1 capsule (100 mg total) by mouth 3 (three) times daily as needed for cough. (Patient not taking: Reported on 07/22/2022)   Current Facility-Administered Medications on File Prior to Visit  Medication   ipratropium-albuterol (DUONEB) 0.5-2.5 (3) MG/3ML nebulizer solution 3 mL    ROS: all negative except what is noted in the HPI.   Physical Exam:  BP 110/80   Pulse (!) 101   Temp 97.8 F (36.6 C)   Ht 4' 11.5" (1.511 m)   Wt 189 lb 12.8 oz (86.1 kg)   SpO2 98%   BMI 37.69 kg/m    General Appearance: NAD.  Awake, conversant and cooperative. Eyes: PERRLA, EOMs intact.  Sclera white.  Conjunctiva without erythema. Sinuses: No frontal/maxillary tenderness.  No nasal discharge. Nares patent.  ENT/Mouth: UTA right auditory canal d/t cerumen impaction.  Posterior pharynx without swelling or exudate.  Tonsils without swelling or erythema.  Neck: Supple.  No masses, nodules or thyromegaly. Respiratory: Effort is regular with non-labored breathing. Breath sounds are equal bilaterally without rales, rhonchi, wheezing or stridor.  Cardio: RRR with no MRGs. Brisk peripheral pulses without edema.  Abdomen: Active BS in all four quadrants.  Soft and non-tender without guarding, rebound tenderness, hernias or masses. Lymphatics: Non tender without lymphadenopathy.  Musculoskeletal: Full ROM, 5/5 strength, normal ambulation.  No clubbing or cyanosis. Skin: Appropriate color for ethnicity. Warm without rashes, lesions, ecchymosis, ulcers.  Neuro: CN II-XII grossly normal. Normal muscle tone without cerebellar symptoms and intact sensation.   Psych: AO X 3,  appropriate mood and affect, insight and judgment.     Adela Glimpse, NP 1:50 PM [facility]

## 2022-07-24 ENCOUNTER — Ambulatory Visit (INDEPENDENT_AMBULATORY_CARE_PROVIDER_SITE_OTHER): Payer: Medicare PPO | Admitting: Podiatry

## 2022-07-24 DIAGNOSIS — Z91199 Patient's noncompliance with other medical treatment and regimen due to unspecified reason: Secondary | ICD-10-CM

## 2022-07-29 NOTE — Progress Notes (Signed)
1. No-show for appointment     

## 2022-08-14 ENCOUNTER — Encounter: Payer: Self-pay | Admitting: Nurse Practitioner

## 2022-08-14 ENCOUNTER — Ambulatory Visit: Payer: Medicare PPO | Admitting: Nurse Practitioner

## 2022-08-14 VITALS — BP 134/88 | HR 103 | Temp 97.7°F | Ht 59.5 in | Wt 190.2 lb

## 2022-08-14 DIAGNOSIS — R252 Cramp and spasm: Secondary | ICD-10-CM | POA: Diagnosis not present

## 2022-08-14 DIAGNOSIS — M5441 Lumbago with sciatica, right side: Secondary | ICD-10-CM | POA: Diagnosis not present

## 2022-08-14 DIAGNOSIS — I10 Essential (primary) hypertension: Secondary | ICD-10-CM

## 2022-08-14 MED ORDER — DEXAMETHASONE 4 MG PO TABS
ORAL_TABLET | ORAL | 0 refills | Status: DC
Start: 2022-08-14 — End: 2022-09-30

## 2022-08-14 NOTE — Progress Notes (Signed)
Assessment and Plan:  Brittney Gray was seen today for acute visit.  Diagnoses and all orders for this visit:  Essential hypertension - continue medications, DASH diet, exercise and monitor at home. Call if greater than 130/80.   Morbid obesity (HCC) - BMI 35+ with T2DM, htn, hld Continue diet that is low in saturated fats and simple carbs Increase lean proteins, fresh fruits and vegetable Increase activity as tolerated  Muscle cramps/Acute right-sided back pain with sciatica Use heat, rest  Dexamethasone taper as directed 400 mg of magnesium daily at bedtime If no resolution of pain notify the office and will refer to orthopedics -     dexamethasone (DECADRON) 4 MG tablet; Take 3 tabs for 3 days, 2 tabs for 3 days 1 tab for 5 days. Take with food.         Further disposition pending results of labs. Discussed med's effects and SE's.   Over 30 minutes of exam, counseling, chart review, and critical decision making was performed.   Future Appointments  Date Time Provider Department Center  08/16/2022  8:30 AM [facility] DX DEXA 1 [facility]  09/18/2022  9:30 AM Lucky Cowboy, MD [facility] None  01/30/2023 10:00 AM Adela Glimpse, NP [facility] None    ------------------------------------------------------------------------------------------------------------------   HPI BP 134/88   Pulse (!) 103   Temp 97.7 F (36.5 C)   Ht 4' 11.5" (1.511 m)   Wt 190 lb 3.2 oz (86.3 kg)   SpO2 96%   BMI 37.77 kg/m   72 y.o.female presents for pain in right buttock. Has bad leg cramps in right upper thigh and then left upper thigh on early Sunday morning. She took mustard and walked around and they resolved. She had vascular lower extremity U/S 06/26/22 that showed no DVT's, did show cystic structure behind left knee. She had a family reunion and helped set up Friday and then served so was on her feet all night.  Saturday she was on her feet again all day.   She is having low back  pain and pain that starts in right buttock and radiates down leg. States she has had low back intermittently for many years but this is first time she has had pain in buttock that radiates down thigh.   BP well controlled with Amlodipine 5 mg every day and furosemide 40 mg 1/2 tab QD BP Readings from Last 3 Encounters:  08/14/22 134/88  07/22/22 110/80  07/08/22 130/82  Denies headaches, chest pain, shortness of breath and dizziness    BMI is Body mass index is 37.77 kg/m., she has been working on diet and exercise. Wt Readings from Last 3 Encounters:  08/14/22 190 lb 3.2 oz (86.3 kg)  07/22/22 189 lb 12.8 oz (86.1 kg)  07/08/22 192 lb (87.1 kg)     Past Medical History:  Diagnosis Date   Hypertension    Lumbar disc disease 08/31/2018   Melanoma (HCC) 2022   Obesity    Pre-diabetes    Vitamin D deficiency      Allergies  Allergen Reactions   Ziac [Bisoprolol-Hydrochlorothiazide]     Flushing     Current Outpatient Medications on File Prior to Visit  Medication Sig   albuterol (VENTOLIN HFA) 108 (90 Base) MCG/ACT inhaler Inhale 2 puffs into the lungs every 4 (four) hours as needed for wheezing or shortness of breath.   allopurinol (ZYLOPRIM) 300 MG tablet TAKE 1 TABLET DAILY TO PREVENT GOUT   amLODipine (NORVASC) 5 MG tablet Take  1  tablet  Daily  for BP                                                          /                                        TAKE                                                                BY                                                  MOUTH   aspirin EC 81 MG tablet Take  1 tablet  Daily   betamethasone dipropionate (DIPROLENE) 0.05 % ointment Apply topically.   Cholecalciferol (VITAMIN D3) 25 MCG (1000 UT) CAPS Take 4000 IU daily.   diclofenac Sodium (VOLTAREN) 1 % GEL Apply 4 g topically 4 (four) times daily.   furosemide (LASIX) 40 MG tablet Take  1 tablet  2 x /day  for BP & Fluid Retention / Edema (Patient taking differently: Takes  1/2 tablet in the evening)   Magnesium 250 MG TABS Take by mouth daily.   benzonatate (TESSALON) 100 MG capsule Take 1 capsule (100 mg total) by mouth 3 (three) times daily as needed for cough. (Patient not taking: Reported on 08/14/2022)   COVID-19 mRNA bivalent vaccine, Pfizer, (PFIZER COVID-19 VAC BIVALENT) injection Inject into the muscle.   Current Facility-Administered Medications on File Prior to Visit  Medication   ipratropium-albuterol (DUONEB) 0.5-2.5 (3) MG/3ML nebulizer solution 3 mL    ROS: all negative except above.   Physical Exam:  BP 134/88   Pulse (!) 103   Temp 97.7 F (36.5 C)   Ht 4' 11.5" (1.511 m)   Wt 190 lb 3.2 oz (86.3 kg)   SpO2 96%   BMI 37.77 kg/m   General Appearance: Pleasant obese female, in no apparent distress. Eyes: PERRLA, EOMs, conjunctiva no swelling or erythema Neck: Supple, thyroid normal.  Respiratory: Respiratory effort normal, BS equal bilaterally without rales, rhonchi, wheezing or stridor.  Cardio: RRR with no MRGs. Brisk peripheral pulses without edema. Normal popliteal pulses bilaterally Abdomen: Soft, + BS.  Non tender, no guarding, rebound, hernias, masses. Lymphatics: Non tender without lymphadenopathy.  Musculoskeletal: Full ROM, 5/5 strength, antalgic gait. Tenderness of lower back and piriformis of right buttock Skin: Warm, dry without rashes, lesions, ecchymosis.  Neuro: Cranial nerves intact. Normal muscle tone, no cerebellar symptoms. Sensation intact.  Psych: Awake and oriented X 3, normal affect, Insight and Judgment appropriate.     Brittney Dick, NP 11:40 AM [facility]

## 2022-08-14 NOTE — Patient Instructions (Signed)
Take Magnesium 2 tabs at bedtime  Take dexamethasone 4 mg 3 tabs x 3 days, 2 tabs x 3 days and 1 tab x 5 days.  If pain does not improve notify the office  Sciatica  Sciatica is pain, weakness, tingling, or loss of feeling (numbness) along the sciatic nerve. The sciatic nerve starts in the lower back and goes down the back of each leg. Sciatica usually affects one side of the body. Sciatica usually goes away on its own or with treatment. Sometimes, sciatica may come back. What are the causes? This condition happens when the sciatic nerve is pinched or has pressure put on it. This may be caused by: A disk in between the bones of the spine bulging out too far (herniated disk). Changes in the spinal disks due to aging. A condition that affects a muscle in the butt. Extra bone growth near the sciatic nerve. A break (fracture) of the area between your hip bones (pelvis). Pregnancy. Tumor. This is rare. What increases the risk? You are more likely to develop this condition if you: Play sports that put pressure or stress on the spine. Have poor strength and ease of movement (flexibility). Have had a back injury or back surgery. Sit for long periods of time. Do activities that involve bending or lifting over and over again. Are very overweight (obese). What are the signs or symptoms? Symptoms can vary from mild to very bad. They may include: Any of these problems in the lower back, leg, hip, or butt: Mild tingling, loss of feeling, or dull aches. A burning feeling. Sharp pains. Loss of feeling in the back of the calf or the sole of the foot. Leg weakness. Very bad back pain that makes it hard to move. These symptoms may get worse when you cough, sneeze, or laugh. They may also get worse when you sit or stand for long periods of time. How is this treated? This condition often gets better without any treatment. However, treatment may include: Changing or cutting back on physical activity  when you have pain. Exercising, including strengthening and stretching. Putting ice or heat on the affected area. Shots of medicines to relieve pain and swelling or to relax your muscles. Surgery. Follow these instructions at home: Medicines Take over-the-counter and prescription medicines only as told by your doctor. Ask your doctor if you should avoid driving or using machines while you are taking your medicine. Managing pain     If told, put ice on the affected area. To do this: Put ice in a plastic bag. Place a towel between your skin and the bag. Leave the ice on for 20 minutes, 2-3 times a day. If your skin turns bright red, take off the ice right away to prevent skin damage. The risk of skin damage is higher if you cannot feel pain, heat, or cold. If told, put heat on the affected area. Do this as often as told by your doctor. Use the heat source that your doctor tells you to use, such as a moist heat pack or a heating pad. Place a towel between your skin and the heat source. Leave the heat on for 20-30 minutes. If your skin turns bright red, take off the heat right away to prevent burns. The risk of burns is higher if you cannot feel pain, heat, or cold. Activity  Return to your normal activities when your doctor says that it is safe. Avoid activities that make your symptoms worse. Take short rests during  the day. When you rest for a long time, do some physical activity or stretching between periods of rest. Avoid sitting for a long time without moving. Get up and move around at least one time each hour. Do exercises and stretches as told by your doctor. Do not lift anything that is heavier than 10 lb (4.5 kg). Avoid lifting heavy things even when you do not have symptoms. Avoid lifting heavy things over and over. When you lift objects, always lift in a way that is safe for your body. To do this, you should: Bend your knees. Keep the object close to your body. Avoid  twisting. General instructions Stay at a healthy weight. Wear comfortable shoes that support your feet. Avoid wearing high heels. Avoid sleeping on a mattress that is too soft or too hard. You might have less pain if you sleep on a mattress that is firm enough to support your back. Contact a doctor if: Your pain is not controlled by medicine. Your pain does not get better. Your pain gets worse. Your pain lasts longer than 4 weeks. You lose weight without trying. Get help right away if: You cannot control when you pee (urinate) or poop (have a bowel movement). You have weakness in any of these areas and it gets worse: Lower back. The area between your hip bones. Butt. Legs. You have redness or swelling of your back. You have a burning feeling when you pee. Summary Sciatica is pain, weakness, tingling, or loss of feeling (numbness) along the sciatic nerve. This may include the lower back, legs, hips, and butt. This condition happens when the sciatic nerve is pinched or has pressure put on it. Treatment often includes rest, exercise, medicines, and putting ice or heat on the affected area. This information is not intended to replace advice given to you by your health care provider. Make sure you discuss any questions you have with your health care provider. Document Revised: 04/02/2021 Document Reviewed: 04/02/2021 Elsevier Patient Education  2024 [employer].

## 2022-08-16 ENCOUNTER — Ambulatory Visit
Admission: RE | Admit: 2022-08-16 | Discharge: 2022-08-16 | Disposition: A | Discharge status: Home / Self Care | Payer: Medicare PPO | Source: Ambulatory Visit | Attending: Nurse Practitioner | Admitting: Nurse Practitioner

## 2022-08-16 DIAGNOSIS — M8588 Other specified disorders of bone density and structure, other site: Secondary | ICD-10-CM | POA: Diagnosis not present

## 2022-08-16 DIAGNOSIS — E349 Endocrine disorder, unspecified: Secondary | ICD-10-CM | POA: Diagnosis not present

## 2022-08-16 DIAGNOSIS — N958 Other specified menopausal and perimenopausal disorders: Secondary | ICD-10-CM | POA: Diagnosis not present

## 2022-08-16 DIAGNOSIS — Z13820 Encounter for screening for osteoporosis: Secondary | ICD-10-CM

## 2022-09-05 ENCOUNTER — Encounter: Payer: Self-pay | Admitting: Nurse Practitioner

## 2022-09-10 ENCOUNTER — Telehealth: Payer: Self-pay | Admitting: Nurse Practitioner

## 2022-09-10 NOTE — Telephone Encounter (Signed)
Pt said a referral was placed for her to have a colonoscopy. She said she does not need one until 09/2024. Also her "butt bone" is still giving her issues.

## 2022-09-11 ENCOUNTER — Other Ambulatory Visit: Payer: Self-pay | Admitting: Nurse Practitioner

## 2022-09-11 DIAGNOSIS — M5441 Lumbago with sciatica, right side: Secondary | ICD-10-CM

## 2022-09-11 NOTE — Telephone Encounter (Signed)
I have put in a referral to orthopedics

## 2022-09-11 NOTE — Telephone Encounter (Signed)
Please ask pt to clarify her symptoms.  If it is still the low back pain with radiation I would like to refer her to orthopedics

## 2022-09-18 ENCOUNTER — Ambulatory Visit: Payer: Medicare PPO | Admitting: Internal Medicine

## 2022-09-18 NOTE — Progress Notes (Addendum)
     C  A  N  C  E  L  L  E  D  Car    Trouble

## 2022-09-29 NOTE — Progress Notes (Unsigned)
Future Appointments  Date Time Provider Department  09/30/2022                 3 mo ov   3:30 PM Lucky Cowboy, MD [facility]  01/30/2023                 wellness 10:00 AM Adela Glimpse, NP [facility]    History of Present Illness:       This very nice 72 y.o. DBF with  HTN, HLD, T2_DM w/CKD 2 and Vitamin D Deficiency presents for 3 month follow up. Patient has hx/o Gout quiescent on her meds.        Patient has Morbid Obesity  (BMI 37+)  associated with  HTN, T2DM & HLD.        Patient is treated for HTN circa  2002  & BP has been controlled at home. Today's BP is at goal -                       . Patient has had no complaints of any cardiac type chest pain, palpitations, dyspnea Pollyann Kennedy /PND, dizziness, claudication or dependent edema.        Hyperlipidemia is controlled with diet & meds. Patient denies myalgias or other med SE's. Last Lipids were  at goal :  Lab Results  Component Value Date   CHOL 153 02/07/2022   HDL 55 02/07/2022   LDLCALC 82 02/07/2022   TRIG 84 02/07/2022   CHOLHDL 2.8 02/07/2022     Also, the patient has Morbid Obesity (BMI 36+) and  hx/o T2 _DM predating from 2015 which she's been managing with diet and has had no symptoms of reactive hypoglycemia, diabetic polys, paresthesias or visual blurring.  Last A1c was not at goal :  Lab Results  Component Value Date   HGBA1C 6.2 (H) 06/04/2022                                                      Further, the patient also has history of Vitamin D Deficiency ("32" /2016) and supplements vitamin D without any suspected side-effects. Last vitamin D was still low :  Lab Results  Component Value Date   VD25OH 61 05/29/2021       Current Outpatient Medications  Medication Instructions   Allopurinol  300 MG tablet TAKE 1 TABLET DAILY    amLODipine  5 MG tablet TAKE 1 TABLET  EVERY DAY    aspirin EC 81 MG tablet Take  1 tablet  Daily   VITAMIN D 1000 u Take 4000 IU daily.   diclofenac 4 g  Topical, 4 times daily   furosemide (LASIX) 40 MG tablet Take  1 tablet  2 x /day    Magnesium 250 MG TABS Oral, Daily     Allergies  Allergen Reactions   Ziac [Bisoprolol-Hydrochlorothiazide] Flushing        PMHx:   Past Medical History:  Diagnosis Date   Hypertension    Lumbar disc disease 08/31/2018   Melanoma (HCC) 2022   Obesity    Pre-diabetes    Vitamin D deficiency      Immunization History  Administered Date(s) Administered   Influenza, High Dose  05/04/2018   PFIZER  SARS-COV-2 Vacc 01/31/2019, 02/27/2019, 12/25/2019   PPD Test 04/26/2013  Td 05/18/2020     Past Surgical History:  Procedure Laterality Date   CARPAL TUNNEL RELEASE Right    CESAREAN SECTION  01/07/1969   KNEE ARTHROSCOPY Right    SKIN GRAFT SPLIT THICKNESS LEG / FOOT Left 07/31/2020   Melanoma excision with graft, Dr. Karen Chafe, [facility]   SKIN SURGERY Left 07/07/2020   Wide excision lt foot melanoma, Dr. Tarry Kos, [facility]   TUBAL LIGATION      FHx:    Reviewed / unchanged  SHx:    Reviewed / unchanged   Systems Review:  Constitutional: Denies fever, chills, wt changes, headaches, insomnia, fatigue, night sweats, change in appetite. Eyes: Denies redness, blurred vision, diplopia, discharge, itchy, watery eyes.  ENT: Denies discharge, congestion, post nasal drip, epistaxis, sore throat, earache, hearing loss, dental pain, tinnitus, vertigo, sinus pain, snoring.  CV: Denies chest pain, palpitations, irregular heartbeat, syncope, dyspnea, diaphoresis, orthopnea, PND, claudication or edema. Respiratory: denies cough, dyspnea, DOE, pleurisy, hoarseness, laryngitis, wheezing.  Gastrointestinal: Denies dysphagia, odynophagia, heartburn, reflux, water brash, abdominal pain or cramps, nausea, vomiting, bloating, diarrhea, constipation, hematemesis, melena, hematochezia  or hemorrhoids. Genitourinary: Denies dysuria, frequency, urgency, nocturia, hesitancy, discharge, hematuria or flank  pain. Musculoskeletal: Denies arthralgias, myalgias, stiffness, jt. swelling, pain, limping or strain/sprain.  Skin: Denies pruritus, rash, hives, warts, acne, eczema or change in skin lesion(s). Neuro: No weakness, tremor, incoordination, spasms, paresthesia or pain. Psychiatric: Denies confusion, memory loss or sensory loss. Endo: Denies change in weight, skin or hair change.  Heme/Lymph: No excessive bleeding, bruising or enlarged lymph nodes.  Physical Exam  There were no vitals taken for this visit.  Appears  over nourished  and in no distress.  Eyes: PERRLA, EOMs, conjunctiva no swelling or erythema. Sinuses: No frontal/maxillary tenderness ENT/Mouth: EAC's clear, TM's nl w/o erythema, bulging. Nares clear w/o erythema, swelling, exudates. Oropharynx clear without erythema or exudates. Oral hygiene is good. Tongue normal, non obstructing. Hearing intact.  Neck: Supple. Thyroid not palpable. Car 2+/2+ without bruits, nodes or JVD. Chest: Respirations nl with BS clear & equal w/o rales, rhonchi, wheezing or stridor.  Cor: Heart sounds normal w/ regular rate and rhythm without sig. murmurs, gallops, clicks or rubs. Peripheral pulses normal and equal  without edema.  Abdomen: Soft & bowel sounds normal. Non-tender w/o guarding, rebound, hernias, masses or organomegaly.  Lymphatics: Unremarkable.  Musculoskeletal: Full ROM all peripheral extremities, joint stability, 5/5 strength and normal gait.  Skin: Warm, dry without exposed rashes, lesions or ecchymosis apparent.  Neuro: Cranial nerves intact, reflexes equal bilaterally. Sensory-motor testing grossly intact. Tendon reflexes grossly intact.  Pysch: Alert & oriented x 3.  Insight and judgement nl & appropriate. No ideations.  Assessment and Plan:  1. Morbid obesity (HCC) - BMI 35+ with T2DM, HTN and HLD  - TSH  2. Essential hypertension  - CBC with Differential/Platelet - COMPLETE METABOLIC PANEL WITH GFR - Magnesium -  TSH  3. Hyperlipidemia associated with type 2 diabetes mellitus (HCC)  - Lipid panel - TSH  4. Type 2 diabetes mellitus with stage 2 chronic kidney  disease, without long-term current use of insulin (HCC)  - Hemoglobin A1c - Insulin, random  5. Vitamin D deficiency  - VITAMIN D 25 Hydroxy   6. Gout  - Uric acid  7. Medication management  - CBC with Differential/Platelet - COMPLETE METABOLIC PANEL WITH GFR - Magnesium - Lipid panel - TSH - Hemoglobin A1c - Insulin, random - VITAMIN D 25 Hydroxy  - Uric acid  Discussed  regular exercise, BP monitoring, weight control to achieve/maintain BMI less than 25 and discussed med and SE's. Recommended labs to assess and monitor clinical status with further disposition pending results of labs.  I discussed the assessment and treatment plan with the patient. The patient was provided an opportunity to ask questions and all were answered. The patient agreed with the plan and demonstrated an understanding of the instructions.  I provided over 30 minutes of exam, counseling, chart review and  complex critical decision making.        The patient was advised to call back or seek an in-person evaluation if the symptoms worsen or if the condition fails to improve as anticipated.   Marinus Maw, MD .

## 2022-09-29 NOTE — Patient Instructions (Signed)

## 2022-09-30 ENCOUNTER — Ambulatory Visit (INDEPENDENT_AMBULATORY_CARE_PROVIDER_SITE_OTHER): Payer: Medicare PPO | Admitting: Internal Medicine

## 2022-09-30 ENCOUNTER — Encounter: Payer: Self-pay | Admitting: Internal Medicine

## 2022-09-30 DIAGNOSIS — Z79899 Other long term (current) drug therapy: Secondary | ICD-10-CM | POA: Diagnosis not present

## 2022-09-30 DIAGNOSIS — E1122 Type 2 diabetes mellitus with diabetic chronic kidney disease: Secondary | ICD-10-CM | POA: Diagnosis not present

## 2022-09-30 DIAGNOSIS — N182 Chronic kidney disease, stage 2 (mild): Secondary | ICD-10-CM | POA: Diagnosis not present

## 2022-09-30 DIAGNOSIS — M1000 Idiopathic gout, unspecified site: Secondary | ICD-10-CM | POA: Diagnosis not present

## 2022-09-30 DIAGNOSIS — Z23 Encounter for immunization: Secondary | ICD-10-CM

## 2022-09-30 DIAGNOSIS — E1169 Type 2 diabetes mellitus with other specified complication: Secondary | ICD-10-CM

## 2022-09-30 DIAGNOSIS — I10 Essential (primary) hypertension: Secondary | ICD-10-CM | POA: Diagnosis not present

## 2022-09-30 DIAGNOSIS — E785 Hyperlipidemia, unspecified: Secondary | ICD-10-CM

## 2022-09-30 DIAGNOSIS — E559 Vitamin D deficiency, unspecified: Secondary | ICD-10-CM

## 2022-09-30 MED ORDER — OLMESARTAN MEDOXOMIL 20 MG PO TABS: ORAL_TABLET | ORAL | 3 refills | Status: DC

## 2022-10-01 LAB — LIPID PANEL
Cholesterol: 172 mg/dL (ref <200)
HDL: 58 mg/dL (ref >=50)
LDL Cholesterol (Calc): 95 mg/dL (calc)
Non-HDL Cholesterol (Calc): 114 mg/dL (calc) (ref <130)
Total CHOL/HDL Ratio: 3 (calc) (ref <5.0)
Triglycerides: 97 mg/dL (ref <150)

## 2022-10-01 LAB — COMPLETE METABOLIC PANEL WITH GFR
AG Ratio: 1.5 (calc) (ref 1.0–2.5)
ALT: 3 U/L — ABNORMAL LOW (ref 6–29)
AST: 12 U/L (ref 10–35)
Albumin: 4.2 g/dL (ref 3.6–5.1)
Alkaline phosphatase (APISO): 90 U/L (ref 37–153)
BUN: 14 mg/dL (ref 7–25)
CO2: 24 mmol/L (ref 20–32)
Calcium: 10.1 mg/dL (ref 8.6–10.4)
Chloride: 105 mmol/L (ref 98–110)
Creat: 0.88 mg/dL (ref 0.60–1.00)
Globulin: 2.8 g/dL (calc) (ref 1.9–3.7)
Glucose, Bld: 88 mg/dL (ref 65–99)
Potassium: 4.5 mmol/L (ref 3.5–5.3)
Sodium: 141 mmol/L (ref 135–146)
Total Bilirubin: 0.4 mg/dL (ref 0.2–1.2)
Total Protein: 7 g/dL (ref 6.1–8.1)
eGFR: 69 mL/min/{1.73_m2} (ref >=60)

## 2022-10-01 LAB — CBC WITH DIFFERENTIAL/PLATELET
Absolute Monocytes: 760 cells/uL (ref 200–950)
Basophils Absolute: 61 cells/uL (ref 0–200)
Basophils Relative: 0.8 %
Eosinophils Absolute: 266 cells/uL (ref 15–500)
Eosinophils Relative: 3.5 %
HCT: 43.9 % (ref 35.0–45.0)
Hemoglobin: 14.2 g/dL (ref 11.7–15.5)
Lymphs Abs: 2409 cells/uL (ref 850–3900)
MCH: 28.5 pg (ref 27.0–33.0)
MCHC: 32.3 g/dL (ref 32.0–36.0)
MCV: 88.2 fL (ref 80.0–100.0)
MPV: 10.6 fL (ref 7.5–12.5)
Monocytes Relative: 10 %
Neutro Abs: 4104 cells/uL (ref 1500–7800)
Neutrophils Relative %: 54 %
Platelets: 262 10*3/uL (ref 140–400)
RBC: 4.98 10*6/uL (ref 3.80–5.10)
RDW: 14.1 % (ref 11.0–15.0)
Total Lymphocyte: 31.7 %
WBC: 7.6 10*3/uL (ref 3.8–10.8)

## 2022-10-01 LAB — HEMOGLOBIN A1C
Hgb A1c MFr Bld: 6.3 % of total Hgb — ABNORMAL HIGH (ref <5.7)
Mean Plasma Glucose: 134 mg/dL
eAG (mmol/L): 7.4 mmol/L

## 2022-10-01 LAB — INSULIN, RANDOM: Insulin: 12.9 u[IU]/mL

## 2022-10-01 LAB — VITAMIN D 25 HYDROXY (VIT D DEFICIENCY, FRACTURES): Vit D, 25-Hydroxy: 44 ng/mL (ref 30–100)

## 2022-10-01 LAB — URIC ACID: Uric Acid, Serum: 3.4 mg/dL (ref 2.5–7.0)

## 2022-10-01 LAB — MAGNESIUM: Magnesium: 2 mg/dL (ref 1.5–2.5)

## 2022-10-01 LAB — TSH: TSH: 1.51 mIU/L (ref 0.40–4.50)

## 2022-10-01 NOTE — Progress Notes (Signed)
<>*<>*<>*<>*<>*<>*<>*<>*<>*<>*<>*<>*<>*<>*<>*<>*<>*<>*<>*<>*<>*<>*<>*<>*<> <>*<>*<>*<>*<>*<>*<>*<>*<>*<>*<>*<>*<>*<>*<>*<>*<>*<>*<>*<>*<>*<>*<>*<>*<>  -  A1c = 6.3% - Getting closer to 6.5% - then become not  preDiabetic,                                                                                    but actually Diabetic , So  -- It is very important that you work harder with diet by                              avoiding all foods that are white except chicken, fish & calliflower.  - Avoid white rice  (brown & wild rice is OK),   - Avoid white potatoes  (sweet potatoes in moderation is OK),   White bread or wheat bread or anything made out of   white flour like bagels, donuts, rolls, buns, biscuits, cakes,  - pastries, cookies, pizza crust, and pasta (made from white flour & egg whites)   - vegetarian pasta or spinach or wheat pasta is OK.  - Multigrain breads like Arnold's, Pepperidge Farm or                                                    multigrain sandwich thins or high fiber breads like   [place] bread or "Dave's Killer" breads that are                                                                        4 to 5 grams fiber per slice !  are best.    Diet, exercise and weight loss can reverse and cure diabetes in the early stages.    <>*<>*<>*<>*<>*<>*<>*<>*<>*<>*<>*<>*<>*<>*<>*<>*<>*<>*<>*<>*<>*<>*<>*<>*<> <>*<>*<>*<>*<>*<>*<>*<>*<>*<>*<>*<>*<>*<>*<>*<>*<>*<>*<>*<>*<>*<>*<>*<>*<>  - Chol = 172  -  Excellent   - Very low risk for Heart Attack  / Stroke  <>*<>*<>*<>*<>*<>*<>*<>*<>*<>*<>*<>*<>*<>*<>*<>*<>*<>*<>*<>*<>*<>*<>*<>*<> <>*<>*<>*<>*<>*<>*<>*<>*<>*<>*<>*<>*<>*<>*<>*<>*<>*<>*<>*<>*<>*<>*<>*<>*<>  -  Vitamin D = 44 - is very low  !   - Vitamin D goal is between 70-100.   - Please INCREASE  your Vitamin D up to 10,000 units EVERY DAY  !    - It is very important as a natural anti-inflammatory and helping the                           immune system  protect against viral infections, like the Covid-19    helping hair, skin, and nails, as well as reducing stroke and heart attack risk.   - It helps your bones and helps with mood.  - It also decreases numerous cancer risks so please  take it as directed.   - Low Vit D is associated with a 200-300% higher risk for CANCER   and 200-300% higher risk for HEART   ATTACK  &  STROKE.    - It is also associated with higher death rate at younger ages,   autoimmune diseases like Rheumatoid arthritis, Lupus, Multiple Sclerosis.     - Also many other serious conditions, like depression, Alzheimer's  Dementia,  muscle aches, fatigue, fibromyalgia   <>*<>*<>*<>*<>*<>*<>*<>*<>*<>*<>*<>*<>*<>*<>*<>*<>*<>*<>*<>*<>*<>*<>*<>*<> <>*<>*<>*<>*<>*<>*<>*<>*<>*<>*<>*<>*<>*<>*<>*<>*<>*<>*<>*<>*<>*<>*<>*<>*<>  - Uric acid / Gout test is OK - Please continue Allopurinol   <>*<>*<>*<>*<>*<>*<>*<>*<>*<>*<>*<>*<>*<>*<>*<>*<>*<>*<>*<>*<>*<>*<>*<>*<> <>*<>*<>*<>*<>*<>*<>*<>*<>*<>*<>*<>*<>*<>*<>*<>*<>*<>*<>*<>*<>*<>*<>*<>*<>  - All Else - CBC - Kidneys - Electrolytes - Liver - Magnesium & Thyroid    - all  Normal / OK  <>*<>*<>*<>*<>*<>*<>*<>*<>*<>*<>*<>*<>*<>*<>*<>*<>*<>*<>*<>*<>*<>*<>*<>*<> <>*<>*<>*<>*<>*<>*<>*<>*<>*<>*<>*<>*<>*<>*<>*<>*<>*<>*<>*<>*<>*<>*<>*<>*<>

## 2022-10-08 DIAGNOSIS — M5451 Vertebrogenic low back pain: Secondary | ICD-10-CM | POA: Diagnosis not present

## 2022-10-22 ENCOUNTER — Other Ambulatory Visit: Payer: Self-pay | Admitting: Internal Medicine

## 2022-10-22 DIAGNOSIS — M533 Sacrococcygeal disorders, not elsewhere classified: Secondary | ICD-10-CM

## 2022-10-22 DIAGNOSIS — M545 Low back pain, unspecified: Secondary | ICD-10-CM

## 2022-10-22 MED ORDER — DEXAMETHASONE 2 MG PO TABS
ORAL_TABLET | ORAL | 0 refills | Status: DC
Start: 2022-10-22 — End: 2023-01-09

## 2022-11-06 DIAGNOSIS — E785 Hyperlipidemia, unspecified: Secondary | ICD-10-CM | POA: Diagnosis not present

## 2022-11-06 DIAGNOSIS — R7303 Prediabetes: Secondary | ICD-10-CM | POA: Diagnosis not present

## 2022-11-06 DIAGNOSIS — I872 Venous insufficiency (chronic) (peripheral): Secondary | ICD-10-CM | POA: Diagnosis not present

## 2022-11-06 DIAGNOSIS — I129 Hypertensive chronic kidney disease with stage 1 through stage 4 chronic kidney disease, or unspecified chronic kidney disease: Secondary | ICD-10-CM | POA: Diagnosis not present

## 2022-11-06 DIAGNOSIS — J4520 Mild intermittent asthma, uncomplicated: Secondary | ICD-10-CM | POA: Diagnosis not present

## 2022-11-06 DIAGNOSIS — M8580 Other specified disorders of bone density and structure, unspecified site: Secondary | ICD-10-CM | POA: Diagnosis not present

## 2022-11-06 DIAGNOSIS — N182 Chronic kidney disease, stage 2 (mild): Secondary | ICD-10-CM | POA: Diagnosis not present

## 2022-11-06 DIAGNOSIS — E669 Obesity, unspecified: Secondary | ICD-10-CM | POA: Diagnosis not present

## 2022-11-06 DIAGNOSIS — M109 Gout, unspecified: Secondary | ICD-10-CM | POA: Diagnosis not present

## 2022-11-27 ENCOUNTER — Ambulatory Visit (INDEPENDENT_AMBULATORY_CARE_PROVIDER_SITE_OTHER): Payer: Medicare PPO | Admitting: Nurse Practitioner

## 2022-11-27 ENCOUNTER — Encounter: Payer: Self-pay | Admitting: Nurse Practitioner

## 2022-11-27 VITALS — BP 156/82 | HR 101 | Temp 97.8°F | Ht 59.5 in | Wt 193.0 lb

## 2022-11-27 DIAGNOSIS — I10 Essential (primary) hypertension: Secondary | ICD-10-CM

## 2022-11-27 DIAGNOSIS — M79672 Pain in left foot: Secondary | ICD-10-CM | POA: Diagnosis not present

## 2022-11-27 NOTE — Progress Notes (Signed)
Assessment and Plan:  Brittney Gray was seen today for a .  Diagnoses and all order for this visit:  Essential Hypertension Stop Olmesartan - assess HA - if improved will not SE to Olmesartan and no longer prescribe Restart Amlodipine, increase to 10 mg (has 5 mg on hand). May need to re-introduce Bisoprolol-hydrochlorothiazide. Gaol 130/80 or less Discussed DASH (Dietary Approaches to Stop Hypertension) DASH diet is lower in sodium than a typical American diet. Cut back on foods that are high in saturated fat, cholesterol, and trans fats. Eat more whole-grain foods, fish, poultry, and nuts Remain active and exercise as tolerated daily.  RTC in 2-4 weeks of BP does not remain well controlled.  Left heel pain Plantar fasciculitis vs tendonitis vs heel spur Suggest brace support, arch support, stretching exercises OTC analgesic PRN as directed    Notify office for further evaluation and treatment, questions or concerns if any reported s/s fail to improve.   The patient was advised to call back or seek an in-person evaluation if any symptoms worsen or if the condition fails to improve as anticipated.   Further disposition pending results of labs. Discussed med's effects and SE's.    I discussed the assessment and treatment plan with the patient. The patient was provided an opportunity to ask questions and all were answered. The patient agreed with the plan and demonstrated an understanding of the instructions.  Discussed med's effects and SE's. Screening labs and tests as requested with regular follow-up as recommended.  I provided 30 minutes of face-to-face time during this encounter including counseling, chart review, and critical decision making was preformed.  Today's Plan of Care is based on a patient-centered health care approach known as shared decision making - the decisions, tests and treatments allow for patient preferences and values to be balanced with clinical  evidence.     Future Appointments  Date Time Provider Department Center  01/30/2023 10:00 AM Adela Glimpse, NP [facility] None  05/01/2023  3:00 PM Adela Glimpse, NP [facility] None  07/31/2023  3:30 PM Lucky Cowboy, MD [facility] None    ------------------------------------------------------------------------------------------------------------------   HPI BP (!) 156/82   Pulse (!) 101   Temp 97.8 F (36.6 C)   Ht 4' 11.5" (1.511 m)   Wt 193 lb (87.5 kg)   SpO2 98%   BMI 38.33 kg/m   72 y.o.female presents for evaluation of medication management for blood pressure.  States that since starting Olmesartan 09/2022 she has noticed increase in daily headache.  Prior to starting the Olmesartan she was taking Amlodipine 5  mg.  She did not have any SE with this medication.  She has a lot taken Ziac in the past - does not remember why this medication was discontinued.  She denies any recent fall, trauma to head, or URI.  Denies dizziness, vision changes.  BP was well controlled prior to medication change.    BP Readings from Last 3 Encounters:  11/27/22 (!) 156/82  09/30/22 130/80  08/14/22 134/88   She is also concerned for LLE heed pain.  No recent injury.  Has noticed for the past two weeks increase in central heel pain, in the morning.  Denies swelling, redness.  She does not wear good arch support shoes.    Past Medical History:  Diagnosis Date   Hypertension    Lumbar disc disease 08/31/2018   Melanoma (HCC) 2022   Obesity    Pre-diabetes    Vitamin D deficiency  Allergies  Allergen Reactions   Ziac [Bisoprolol-Hydrochlorothiazide]     Flushing     Current Outpatient Medications on File Prior to Visit  Medication Sig   albuterol (VENTOLIN HFA) 108 (90 Base) MCG/ACT inhaler Inhale 2 puffs into the lungs every 4 (four) hours as needed for wheezing or shortness of breath.   allopurinol (ZYLOPRIM) 300 MG tablet TAKE 1 TABLET DAILY TO PREVENT GOUT   aspirin EC  81 MG tablet Take  1 tablet  Daily   betamethasone dipropionate (DIPROLENE) 0.05 % ointment Apply topically.   Cholecalciferol (VITAMIN D3) 25 MCG (1000 UT) CAPS Take 4000 IU daily.   diclofenac Sodium (VOLTAREN) 1 % GEL Apply 4 g topically 4 (four) times daily.   furosemide (LASIX) 40 MG tablet Take  1 tablet  2 x /day  for BP & Fluid Retention / Edema (Patient taking differently: Takes 1/2 tablet in the evening)   Magnesium 250 MG TABS Take by mouth daily.   olmesartan (BENICAR) 20 MG tablet Take   1 tablet  Daily   for BP   dexamethasone (DECADRON) 2 MG tablet Take 1 tab 3 x /day for 2 days,      then 2 x /day for 2  Days,     then 1 tab daily (Patient not taking: Reported on 11/27/2022)   Current Facility-Administered Medications on File Prior to Visit  Medication   ipratropium-albuterol (DUONEB) 0.5-2.5 (3) MG/3ML nebulizer solution 3 mL    ROS: all negative except what is noted in the HPI.   Physical Exam:  BP (!) 156/82   Pulse (!) 101   Temp 97.8 F (36.6 C)   Ht 4' 11.5" (1.511 m)   Wt 193 lb (87.5 kg)   SpO2 98%   BMI 38.33 kg/m   General Appearance: NAD.  Awake, conversant and cooperative. Eyes: PERRLA, EOMs intact.  Sclera white.  Conjunctiva without erythema. Sinuses: No frontal/maxillary tenderness.  No nasal discharge. Nares patent.  ENT/Mouth: Ext aud canals clear.  Bilateral TMs w/DOL and without erythema or bulging. Hearing intact.  Posterior pharynx without swelling or exudate.  Tonsils without swelling or erythema.  Neck: Supple.  No masses, nodules or thyromegaly. Respiratory: Effort is regular with non-labored breathing. Breath sounds are equal bilaterally without rales, rhonchi, wheezing or stridor.  Cardio: RRR with no MRGs. Brisk peripheral pulses without edema.  Abdomen: Active BS in all four quadrants.  Soft and non-tender without guarding, rebound tenderness, hernias or masses. Lymphatics: Non tender without lymphadenopathy.  Musculoskeletal: Full ROM,  5/5 strength, normal ambulation.  No clubbing or cyanosis. Skin: Appropriate color for ethnicity. Warm without rashes, lesions, ecchymosis, ulcers.  Neuro: CN II-XII grossly normal. Normal muscle tone without cerebellar symptoms and intact sensation.   Psych: AO X 3,  appropriate mood and affect, insight and judgment.     Adela Glimpse, NP 11:47 AM [facility]

## 2022-11-27 NOTE — Patient Instructions (Signed)
Tendinitis  Tendinitis is irritation and swelling (inflammation) of a tendon. A tendon is a cord of tissue that connects muscle to bone. Tendinitis is most common in the shoulder, ankle, elbow, or wrist. What are the causes? Using a tendon or muscle too much (overuse). This is the most common cause. Wear and tear that happens as you age. Injury. Some medical conditions, such as arthritis. Some medicines. What increases the risk? You are more likely to get this condition if you do activities that involve the same movements over and over again (repetitive motions). What are the signs or symptoms? Pain. Tenderness. Mild swelling. Decreased range of motion. How is this treated? This condition is usually treated with RICE therapy. RICE stands for: Rest. Ice. Compression. This means putting pressure on the affected area. Elevation. This means raising the affected area above the level of your heart. Treatment may also include: Medicines for swelling or pain. Exercises or physical therapy to help your tendon move better and get stronger. A brace or splint. A shot (injection) of a type of medicine called corticosteroid. Surgery. This is rarely needed. Follow these instructions at home: If you have a splint or brace that can be taken off: Wear the splint or brace as told by your doctor. Take it off only as told by your doctor. Check the skin around the splint or brace every day. Tell your doctor if you see problems. Loosen the splint or brace if your fingers or toes: Tingle. Become numb. Turn cold and blue. Keep the splint or brace clean. If the splint or brace is not waterproof: Do not let it get wet. Cover it with a watertight covering when you take a bath or shower, or take it off as told by your doctor. Managing pain, stiffness, and swelling     If told, put ice on the affected area. To do this: If you have a removable splint or brace, take it off as told by your doctor. Put  ice in a plastic bag. Place a towel between your skin and the bag. Leave the ice on for 20 minutes, 2-3 times a day. Take off the ice if your skin turns bright red. This is very important. If you cannot feel pain, heat, or cold, you have a greater risk of damage to the area. Move the fingers or toes of the affected arm or leg often, if this applies. If told, raise the affected area above the level of your heart while you are sitting or lying down. If told, put heat on the affected area before you exercise. Use the heat source that your doctor recommends, such as a moist heat pack or a heating pad. Place a towel between your skin and the heat source. Leave the heat on for 20-30 minutes. Take off the heat if your skin turns bright red. This is very important. If you cannot feel pain, heat, or cold, you have a greater risk of getting burned. Activity Rest the affected area as told by your doctor. Ask your doctor when it is safe to drive if you have a splint or brace on any part of your arm or leg. Return to your normal activities when your doctor says that it is safe. Avoid using the affected area while you have symptoms. Do exercises as told by your doctor. General instructions Wear an elastic bandage or pressure (compression) wrap only as told by your doctor. Take over-the-counter and prescription medicines only as told by your doctor. Keep all follow-up  visits. Contact a doctor if: You do not get better. You get new problems, such as numbness in your hands or feet, and you do not know why. Summary Tendinitis is irritation and swelling (inflammation) of a tendon. You are more likely to get this condition if you do activities that involve the same movements over and over again. This condition is usually treated with RICE therapy. RICE stands for rest, ice, compression, and elevate. Avoid using the affected area while you have symptoms. This information is not intended to replace advice  given to you by your health care provider. Make sure you discuss any questions you have with your health care provider. Document Revised: 08/31/2020 Document Reviewed: 08/31/2020 Elsevier Patient Education  2024 [employer]   Heel Spur  A heel spur is a bony growth that forms on the bottom of the heel bone (calcaneus). Heel spurs are common. They often cause inflammation in the plantar fascia, which is the band of tissue that connects the toe bones to the heel bone. When the plantar fascia is inflamed, it is called plantar fasciitis. This may cause pain on the bottom of the foot, near the heel. Many people with plantar fasciitis also have heel spurs. However, spurs are not the cause of plantar fasciitis pain. What are the causes? The exact cause of heel spurs is not known. They may be caused by: Pressure on the heel bone. Bands of tissues that connect muscle to bone (tendons) pulling on the heel bone. What increases the risk? You are more likely to develop this condition if you: Are older than age 18. Are overweight. Have wear-and-tear arthritis (osteoarthritis). Have plantar fascia inflammation. Participate in sports or activities that include a lot of running or jumping. Wear poorly fitted shoes. What are the signs or symptoms? Some people have no symptoms. If you do have symptoms, they may include: Pain in the bottom of your heel. Pain that is worse when you first get out of bed. Pain that gets worse after walking or standing. How is this diagnosed? This condition may be diagnosed based on: Your symptoms and medical history. A physical exam. A foot X-ray. How is this treated? Treatment for this condition depends on how much pain you have. Treatment options may include: Doing stretching exercises and losing weight, if necessary. Wearing specific shoes or inserts inside of shoes (orthotics) for comfort and support. Wearing splints on your feet while you sleep. Splints keep your  feet in a position (usually a 90-degree angle) that should prevent and relieve the pain you feel when you first get out of bed. They also make stretching easier in the morning. Taking over-the-counter medicine to relieve pain, such as NSAIDs. Using high-intensity sound waves to break up the heel spur (extracorporeal shock wave therapy). Getting steroid injections in your heel to reduce inflammation. Having surgery, if your heel spur causes long-term (chronic) pain. Follow these instructions at home:  Activity Avoid activities that cause pain until you recover, or for as long as told by your health care provider. Do stretching exercises as told. Stretch before exercising or being physically active. Managing pain, stiffness, and swelling If directed, put ice on your foot. To do this: Put ice in a plastic bag. Place a towel between your skin and the bag. Leave the ice on for 20 minutes, 2-3 times a day. Remove the ice if your skin turns bright red. This is very important. If you cannot feel pain, heat, or cold, you have a greater risk  of damage to the area. Move your toes often to reduce stiffness and swelling. When possible, raise (elevate) your foot above the level of your heart while you are sitting or lying down. General instructions Take over-the-counter and prescription medicines only as told by your health care provider. Wear supportive shoes that fit well. Wear splints, inserts, or orthotics as told by your health care provider. If recommended, work with your health care provider to lose weight. This can relieve pressure on your foot. Do not use any products that contain nicotine or tobacco, such as cigarettes, e-cigarettes, and chewing tobacco. These can delay bone healing. If you need help quitting, ask your health care provider. Keep all follow-up visits. This is important. Where to find more information American Academy of Orth[employer]: www.orthoinfo.aaos.org Contact a  health care provider if: Your pain does not go away with treatment. Your pain gets worse. Summary A heel spur is a bony growth that forms on the bottom of the heel bone (calcaneus). Heel spurs often cause inflammation in the plantar fascia, which is the band of tissue that connects the toes to the heel bone. This may cause pain on the bottom of the foot, near the heel. Doing stretching exercises, losing weight, wearing specific shoes or shoe inserts, wearing splints while you sleep, and taking pain medicine may ease the pain and stiffness. Other treatment options may include high-intensity sound waves to break up the heel spur, steroid injections, or surgery. This information is not intended to replace advice given to you by your health care provider. Make sure you discuss any questions you have with your health care provider. Document Revised: 04/20/2019 Document Reviewed: 04/20/2019 Elsevier Patient Education  2024 [employer].

## 2022-12-03 ENCOUNTER — Other Ambulatory Visit: Payer: Self-pay | Admitting: Nurse Practitioner

## 2022-12-03 ENCOUNTER — Telehealth: Payer: Self-pay | Admitting: Nurse Practitioner

## 2022-12-03 MED ORDER — AMLODIPINE BESYLATE 5 MG PO TABS: 5.0000 mg | ORAL_TABLET | Freq: Every day | ORAL | 11 refills | Status: AC

## 2022-12-03 NOTE — Telephone Encounter (Signed)
Pt said she has been on Amlodipine... I do not see it on her medication list. She was asking for a refill Pharm- [id number] - [place], [place] - [address]

## 2023-01-09 ENCOUNTER — Encounter: Payer: Self-pay | Admitting: Nurse Practitioner

## 2023-01-09 ENCOUNTER — Ambulatory Visit: Payer: Medicare PPO | Admitting: Nurse Practitioner

## 2023-01-09 VITALS — BP 140/92 | HR 93 | Temp 97.9°F | Ht 59.5 in | Wt 192.4 lb

## 2023-01-09 DIAGNOSIS — I10 Essential (primary) hypertension: Secondary | ICD-10-CM | POA: Diagnosis not present

## 2023-01-09 DIAGNOSIS — Z79899 Other long term (current) drug therapy: Secondary | ICD-10-CM

## 2023-01-09 DIAGNOSIS — I872 Venous insufficiency (chronic) (peripheral): Secondary | ICD-10-CM | POA: Diagnosis not present

## 2023-01-09 MED ORDER — FUROSEMIDE 40 MG PO TABS
ORAL_TABLET | ORAL | 3 refills | Status: AC
Start: 2023-01-09 — End: ?

## 2023-01-09 NOTE — Progress Notes (Signed)
 Assessment and Plan:  Brittney Gray was seen today for an episodic visit.  Diagnoses and all order for this visit:  1. Essential hypertension (Primary) Continue Amldipine 10 mg daily Start Furosemide  as directed. Discussed DASH (Dietary Approaches to Stop Hypertension) DASH diet is lower in sodium than a typical American diet. Cut back on foods that are high in saturated fat, cholesterol, and trans fats. Eat more whole-grain foods, fish, poultry, and nuts Remain active and exercise as tolerated daily.  Monitor BP at home-Call if greater than 130/80.  Check CMP/CBC  - furosemide  (LASIX ) 40 MG tablet; Take  1 tablet  2 x /day  for BP & Fluid Retention / Edema  Dispense: 180 tablet; Refill: 3  2. Edema of both lower extremities due to peripheral venous insufficiency Discussed increase in edema likely r/t increase in CCB from 5 mg to 10 mg. Should respond to Lasix  - if not, discussed anti-hypertensive medications will need to be changed. Emphasized salt restriction, less than 2000mg  a day. Encouraged daily monitoring of the patient's weight, call office if 5 lb weight loss or gain in a day.  Encouraged regular exercise - elevate feet. If any increasing shortness of breath, swelling, or chest pressure go to ER immediately.  decrease your fluid intake to Monitor fluid intake to less than 2 L daily  - furosemide  (LASIX ) 40 MG tablet; Take  1 tablet  2 x /day  for BP & Fluid Retention / Edema  Dispense: 180 tablet; Refill: 3  3. Medication management All medications discussed and reviewed in full. All questions and concerns regarding medications addressed.    Notify office for further evaluation and treatment, questions or concerns if s/s fail to improve. The risks and benefits of my recommendations, as well as other treatment options were discussed with the patient today. Questions were answered.  Further disposition pending results of labs. Discussed med's effects and SE's.    Over 20  minutes of exam, counseling, chart review, and critical decision making was performed.   Future Appointments  Date Time Provider Department Center  02/17/2023  9:00 AM Laurice President, NP [facility] None  05/01/2023  3:00 PM Laurice President, NP [facility] None  07/31/2023  3:30 PM Tonita Fallow, MD [facility] None    ------------------------------------------------------------------------------------------------------------------   HPI BP (!) 140/92   Pulse 93   Temp 97.9 F (36.6 C)   Ht 4' 11.5 (1.511 m)   Wt 192 lb 6.4 oz (87.3 kg)   SpO2 99%   BMI 38.21 kg/m   73 y.o.female presents for evaluation of BLE edema after changes to BP medications.  During 11/2022 OV anti-hypertensive medications were changed due to  patient stating since starting Olmesartan  09/2022 she had noticed increase in daily headache.  Prior to starting the Olmesartan  she was taking Amlodipine  5  mg.  She did not have any SE with this medication.  She has a hx of taking Ziac  in the past - does not remember why this medication was discontinued.  She reports doing well on Amlodipine  5 mg however BP continued to remain elevated and dose was increased 10 mg daily.  She does have a hx of taking Furosemide  but has not taken in several months.  She denies CP, heart palpitations, syncope, SOB.   Past Medical History:  Diagnosis Date   Hypertension    Lumbar disc disease 08/31/2018   Melanoma (HCC) 2022   Obesity    Pre-diabetes    Vitamin D  deficiency  Allergies  Allergen Reactions   Ziac  [Bisoprolol -Hydrochlorothiazide ]     Flushing     Current Outpatient Medications on File Prior to Visit  Medication Sig   albuterol  (VENTOLIN  HFA) 108 (90 Base) MCG/ACT inhaler Inhale 2 puffs into the lungs every 4 (four) hours as needed for wheezing or shortness of breath.   allopurinol  (ZYLOPRIM ) 300 MG tablet TAKE 1 TABLET DAILY TO PREVENT GOUT   amLODipine  (NORVASC ) 5 MG tablet Take 1 tablet (5 mg total) by mouth  daily.   aspirin  EC 81 MG tablet Take  1 tablet  Daily   betamethasone  dipropionate (DIPROLENE ) 0.05 % ointment Apply topically.   Cholecalciferol (VITAMIN D3) 25 MCG (1000 UT) CAPS Take 4000 IU daily.   dexamethasone  (DECADRON ) 2 MG tablet Take 1 tab 3 x /day for 2 days,      then 2 x /day for 2  Days,     then 1 tab daily   diclofenac  Sodium (VOLTAREN ) 1 % GEL Apply 4 g topically 4 (four) times daily.   Magnesium 250 MG TABS Take by mouth daily.   olmesartan  (BENICAR ) 20 MG tablet Take   1 tablet  Daily   for BP (Patient not taking: Reported on 01/09/2023)   Current Facility-Administered Medications on File Prior to Visit  Medication   ipratropium-albuterol  (DUONEB) 0.5-2.5 (3) MG/3ML nebulizer solution 3 mL    ROS: all negative except what is noted in the HPI.   Physical Exam:  BP (!) 140/92   Pulse 93   Temp 97.9 F (36.6 C)   Ht 4' 11.5 (1.511 m)   Wt 192 lb 6.4 oz (87.3 kg)   SpO2 99%   BMI 38.21 kg/m   General Appearance: NAD.  Awake, conversant and cooperative. Eyes: PERRLA, EOMs intact.  Sclera white.  Conjunctiva without erythema. Sinuses: No frontal/maxillary tenderness.  No nasal discharge. Nares patent.  ENT/Mouth: Ext aud canals clear.  Bilateral TMs w/DOL and without erythema or bulging. Hearing intact.  Posterior pharynx without swelling or exudate.  Tonsils without swelling or erythema.  Neck: Supple.  No masses, nodules or thyromegaly. Respiratory: Effort is regular with non-labored breathing. Breath sounds are equal bilaterally without rales, rhonchi, wheezing or stridor.  Cardio: RRR with no MRGs. Brisk peripheral pulses without +2 edema R>L.  Abdomen: Active BS in all four quadrants.  Soft and non-tender without guarding, rebound tenderness, hernias or masses. Lymphatics: Non tender without lymphadenopathy.  Musculoskeletal: Full ROM, 5/5 strength, normal ambulation.  No clubbing or cyanosis. Skin: Appropriate color for ethnicity. Warm without rashes,  lesions, ecchymosis, ulcers.  Neuro: CN II-XII grossly normal. Normal muscle tone without cerebellar symptoms and intact sensation.   Psych: AO X 3,  appropriate mood and affect, insight and judgment.     BASCOM NECESSARY, NP 4:28 PM [facility]

## 2023-01-09 NOTE — Patient Instructions (Signed)
 Edema  Edema is when you have too much fluid in your body or under your skin. Edema may make your legs, feet, and ankles swell. Swelling often happens in looser tissues, such as around your eyes. This is a common condition. It gets more common as you get older. There are many possible causes of edema. These include: Eating too much salt (sodium). Being on your feet or sitting for a long time. Certain medical conditions, such as: Pregnancy. Heart failure. Liver disease. Kidney disease. Cancer. Hot weather may make edema worse. Edema is usually painless. Your skin may look swollen or shiny. Follow these instructions at home: Medicines Take over-the-counter and prescription medicines only as told by your doctor. Your doctor may prescribe a medicine to help your body get rid of extra water (diuretic). Take this medicine if you are told to take it. Eating and drinking Eat a low-salt (low-sodium) diet as told by your doctor. Sometimes, eating less salt may reduce swelling. Depending on the cause of your swelling, you may need to limit how much fluid you drink (fluid restriction). General instructions Raise the injured area above the level of your heart while you are sitting or lying down. Do not sit still or stand for a long time. Do not wear tight clothes. Do not wear garters on your upper legs. Exercise your legs. This can help the swelling go down. Wear compression stockings as told by your doctor. It is important that these are the right size. These should be prescribed by your doctor to prevent possible injuries. If elastic bandages or wraps are recommended, use them as told by your doctor. Contact a doctor if: Treatment is not working. You have heart, liver, or kidney disease and have symptoms of edema. You have sudden and unexplained weight gain. Get help right away if: You have shortness of breath or chest pain. You cannot breathe when you lie down. You have pain, redness, or  warmth in the swollen areas. You have heart, liver, or kidney disease and get edema all of a sudden. You have a fever and your symptoms get worse all of a sudden. These symptoms may be an emergency. Get help right away. Call 911. Do not wait to see if the symptoms will go away. Do not drive yourself to the hospital. Summary Edema is when you have too much fluid in your body or under your skin. Edema may make your legs, feet, and ankles swell. Swelling often happens in looser tissues, such as around your eyes. Raise the injured area above the level of your heart while you are sitting or lying down. Follow your doctor's instructions about diet and how much fluid you can drink. This information is not intended to replace advice given to you by your health care provider. Make sure you discuss any questions you have with your health care provider. Document Revised: 08/28/2020 Document Reviewed: 08/28/2020 Elsevier Patient Education  2024 [employer].

## 2023-01-30 ENCOUNTER — Ambulatory Visit: Payer: Medicare PPO | Admitting: Nurse Practitioner

## 2023-02-06 ENCOUNTER — Ambulatory Visit: Payer: Medicare PPO | Admitting: Nurse Practitioner

## 2023-02-06 VITALS — BP 130/80 | HR 80 | Temp 97.9°F | Resp 16 | Ht 59.5 in | Wt 187.6 lb

## 2023-02-06 DIAGNOSIS — I872 Venous insufficiency (chronic) (peripheral): Secondary | ICD-10-CM | POA: Diagnosis not present

## 2023-02-06 DIAGNOSIS — R252 Cramp and spasm: Secondary | ICD-10-CM

## 2023-02-06 DIAGNOSIS — I10 Essential (primary) hypertension: Secondary | ICD-10-CM | POA: Diagnosis not present

## 2023-02-06 DIAGNOSIS — Z79899 Other long term (current) drug therapy: Secondary | ICD-10-CM | POA: Diagnosis not present

## 2023-02-06 DIAGNOSIS — G2581 Restless legs syndrome: Secondary | ICD-10-CM

## 2023-02-06 MED ORDER — ROPINIROLE HCL 0.25 MG PO TABS
0.2500 mg | ORAL_TABLET | Freq: Three times a day (TID) | ORAL | 0 refills | Status: AC
Start: 2023-02-06 — End: ?

## 2023-02-06 NOTE — Progress Notes (Signed)
Assessment and Plan:  Brittney Gray was seen today for an episodic visit.  Diagnoses and all order for this visit:  1. Essential hypertension (Primary) Continue Amldipine 10 mg daily for now - may d/c due to increase in BLE edema. Continue Furosemide as directed. Discussed DASH (Dietary Approaches to Stop Hypertension) DASH diet is lower in sodium than a typical American diet. Cut back on foods that are high in saturated fat, cholesterol, and trans fats. Eat more whole-grain foods, fish, poultry, and nuts Remain active and exercise as tolerated daily.  Monitor BP at home-Call if greater than 130/80.  Check CMP/CBC  2. Edema of both lower extremities due to peripheral venous insufficiency Discussed increase in edema likely r/t increase in CCB from 5 mg to 10 mg. Should respond to Lasix - if not, discussed anti-hypertensive medications will need to be changed. Emphasized salt restriction, less than 2000mg  a day. Encouraged daily monitoring of the patient's weight, call office if 5 lb weight loss or gain in a day.  Encouraged regular exercise - elevate feet. If any increasing shortness of breath, swelling, or chest pressure go to ER immediately.  decrease your fluid intake to Monitor fluid intake to less than 2 L daily  3. Medication management All medications discussed and reviewed in full. All questions and concerns regarding medications addressed.    4.  RLS/Cramping Start Requip Will review CMP to assess electrolyte levels given current diuretic. Stay well hydrated.  Orders Placed This Encounter  Procedures   CBC with Differential/Platelet   COMPLETE METABOLIC PANEL WITH GFR   Meds ordered this encounter  Medications   rOPINIRole (REQUIP) 0.25 MG tablet    Sig: Take 1 tablet (0.25 mg total) by mouth 3 (three) times daily.    Dispense:  90 tablet    Refill:  0   Notify office for further evaluation and treatment, questions or concerns if s/s fail to improve. The risks  and benefits of my recommendations, as well as other treatment options were discussed with the patient today. Questions were answered.  Further disposition pending results of labs. Discussed med's effects and SE's.    Over 20 minutes of exam, counseling, chart review, and critical decision making was performed.   Future Appointments  Date Time Provider Department Center  02/17/2023  9:00 AM Brittney Glimpse, NP [facility] None  05/01/2023  3:00 PM Brittney Glimpse, NP [facility] None  07/31/2023  3:30 PM Brittney Cowboy, MD [facility] None    ------------------------------------------------------------------------------------------------------------------   HPI BP 130/80   Pulse 80   Temp 97.9 F (36.6 C)   Resp 16   Ht 4' 11.5" (1.511 m)   Wt 187 lb 9.6 oz (85.1 kg)   SpO2 99%   BMI 37.26 kg/m   73 y.o.female presents for evaluation of BLE edema after changes to BP medications.  During 11/2022 OV anti-hypertensive medications were changed due to  patient stating since starting Olmesartan 09/2022 she had noticed increase in daily headache.  Prior to starting the Olmesartan she was taking Amlodipine 5  mg.  She did not have any SE with this medication.  She has a hx of taking Ziac in the past - does not remember why this medication was discontinued.  She reports doing well on Amlodipine 5 mg however BP continued to remain elevated and dose was increased 10 mg daily.  She does have a hx of taking Furosemide but had not taken in several months. She started taking the Furosemide during last OV but  she feels as though this has not helped the BLE edema and associated increase in BLE cramping.  She does notice R>L edema. She does note the cramping to be present for several months before starting the diuretic.  States cramping will wake her at night and she recently had severe cramping while on a trip to the beach with her friends, where she had to be helped off of the floor.  She states taking a  spoonful of mustard helped.  She denies CP, heart palpitations, syncope, SOB.   Past Medical History:  Diagnosis Date   Hypertension    Lumbar disc disease 08/31/2018   Melanoma (HCC) 2022   Obesity    Pre-diabetes    Vitamin D deficiency      Allergies  Allergen Reactions   Ziac [Bisoprolol-Hydrochlorothiazide]     Flushing     Current Outpatient Medications on File Prior to Visit  Medication Sig   albuterol (VENTOLIN HFA) 108 (90 Base) MCG/ACT inhaler Inhale 2 puffs into the lungs every 4 (four) hours as needed for wheezing or shortness of breath.   allopurinol (ZYLOPRIM) 300 MG tablet TAKE 1 TABLET DAILY TO PREVENT GOUT   amLODipine (NORVASC) 5 MG tablet Take 1 tablet (5 mg total) by mouth daily.   aspirin EC 81 MG tablet Take  1 tablet  Daily   betamethasone dipropionate (DIPROLENE) 0.05 % ointment Apply topically.   Cholecalciferol (VITAMIN D3) 25 MCG (1000 UT) CAPS Take 4000 IU daily.   diclofenac Sodium (VOLTAREN) 1 % GEL Apply 4 g topically 4 (four) times daily.   furosemide (LASIX) 40 MG tablet Take  1 tablet  2 x /day  for BP & Fluid Retention / Edema   Magnesium 250 MG TABS Take by mouth daily.   olmesartan (BENICAR) 20 MG tablet Take   1 tablet  Daily   for BP (Patient not taking: Reported on 02/06/2023)   Current Facility-Administered Medications on File Prior to Visit  Medication   ipratropium-albuterol (DUONEB) 0.5-2.5 (3) MG/3ML nebulizer solution 3 mL    ROS: all negative except what is noted in the HPI.   Physical Exam:  BP 130/80   Pulse 80   Temp 97.9 F (36.6 C)   Resp 16   Ht 4' 11.5" (1.511 m)   Wt 187 lb 9.6 oz (85.1 kg)   SpO2 99%   BMI 37.26 kg/m   General Appearance: NAD.  Awake, conversant and cooperative. Eyes: PERRLA, EOMs intact.  Sclera white.  Conjunctiva without erythema. Sinuses: No frontal/maxillary tenderness.  No nasal discharge. Nares patent.  ENT/Mouth: Ext aud canals clear.  Bilateral TMs w/DOL and without erythema or  bulging. Hearing intact.  Posterior pharynx without swelling or exudate.  Tonsils without swelling or erythema.  Neck: Supple.  No masses, nodules or thyromegaly. Respiratory: Effort is regular with non-labored breathing. Breath sounds are equal bilaterally without rales, rhonchi, wheezing or stridor.  Cardio: RRR with no MRGs. Brisk peripheral pulses without +2 edema R>L.  Abdomen: Active BS in all four quadrants.  Soft and non-tender without guarding, rebound tenderness, hernias or masses. Lymphatics: Non tender without lymphadenopathy.  Musculoskeletal: Full ROM, 5/5 strength, normal ambulation.  No clubbing or cyanosis. Skin: Appropriate color for ethnicity. Warm without rashes, lesions, ecchymosis, ulcers.  Neuro: CN II-XII grossly normal. Normal muscle tone without cerebellar symptoms and intact sensation.   Psych: AO X 3,  appropriate mood and affect, insight and judgment.     Brittney Glimpse, NP 3:44 PM [facility]

## 2023-02-07 LAB — COMPLETE METABOLIC PANEL WITH GFR
AG Ratio: 1.6 (calc) (ref 1.0–2.5)
ALT: <3 U/L — ABNORMAL LOW (ref 6–29)
AST: 14 U/L (ref 10–35)
Albumin: 4.4 g/dL (ref 3.6–5.1)
Alkaline phosphatase (APISO): 95 U/L (ref 37–153)
BUN: 18 mg/dL (ref 7–25)
CO2: 26 mmol/L (ref 20–32)
Calcium: 9.9 mg/dL (ref 8.6–10.4)
Chloride: 104 mmol/L (ref 98–110)
Creat: 1 mg/dL (ref 0.60–1.00)
Globulin: 2.7 g/dL (ref 1.9–3.7)
Glucose, Bld: 104 mg/dL — ABNORMAL HIGH (ref 65–99)
Potassium: 3.8 mmol/L (ref 3.5–5.3)
Sodium: 141 mmol/L (ref 135–146)
Total Bilirubin: 0.5 mg/dL (ref 0.2–1.2)
Total Protein: 7.1 g/dL (ref 6.1–8.1)
eGFR: 59 mL/min/{1.73_m2} — ABNORMAL LOW (ref >=60)

## 2023-02-07 LAB — CBC WITH DIFFERENTIAL/PLATELET
Absolute Lymphocytes: 2108 {cells}/uL (ref 850–3900)
Absolute Monocytes: 615 {cells}/uL (ref 200–950)
Basophils Absolute: 38 {cells}/uL (ref 0–200)
Basophils Relative: 0.5 %
Eosinophils Absolute: 143 {cells}/uL (ref 15–500)
Eosinophils Relative: 1.9 %
HCT: 42.6 % (ref 35.0–45.0)
Hemoglobin: 14.3 g/dL (ref 11.7–15.5)
MCH: 28.9 pg (ref 27.0–33.0)
MCHC: 33.6 g/dL (ref 32.0–36.0)
MCV: 86.2 fL (ref 80.0–100.0)
MPV: 10.4 fL (ref 7.5–12.5)
Monocytes Relative: 8.2 %
Neutro Abs: 4598 {cells}/uL (ref 1500–7800)
Neutrophils Relative %: 61.3 %
Platelets: 285 10*3/uL (ref 140–400)
RBC: 4.94 10*6/uL (ref 3.80–5.10)
RDW: 13.8 % (ref 11.0–15.0)
Total Lymphocyte: 28.1 %
WBC: 7.5 10*3/uL (ref 3.8–10.8)

## 2023-02-09 ENCOUNTER — Encounter: Payer: Self-pay | Admitting: Nurse Practitioner

## 2023-02-09 NOTE — Patient Instructions (Signed)
Ropinirole Tablets What is this medication? ROPINIROLE (roe PIN i role) treats the symptoms of Parkinson disease. It works by acting like dopamine, a substance in your body that helps manage movements and coordination. This reduces the symptoms of Parkinson, such as body stiffness and tremors. It may also be used to treat restless legs syndrome (RLS). This medicine may be used for other purposes; ask your health care provider or pharmacist if you have questions. COMMON BRAND NAME(S): Requip What should I tell my care team before I take this medication? They need to know if you have any of these conditions: Heart disease High blood pressure Kidney disease Liver disease Low blood pressure Narcolepsy Sleep apnea Tobacco use An unusual or allergic reaction to ropinirole, other medications, foods, dyes, or preservatives Pregnant or trying to get pregnant Breast-feeding How should I use this medication? Take this medication by mouth with water. Take it as directed on the prescription label. You can take it with or without food. If it upsets your stomach, take it with food. If it upsets your stomach, take it with food. Keep taking this medication unless your care team tells you to stop. Stopping it too quickly can cause serious side effects. It can also make your condition worse. Talk to your care team about the use of this medication in children. Special care may be needed. Overdosage: If you think you have taken too much of this medicine contact a poison control center or emergency room at once. NOTE: This medicine is only for you. Do not share this medicine with others. What if I miss a dose? If you miss a dose, take it as soon as you can. If it is almost time for your next dose, take only that dose. Do not take double or extra doses. What may interact with this medication? Alcohol Antihistamines for allergy, cough and cold Certain medications for depression, anxiety, or mental health  conditions Certain medications for seizures, such as phenobarbital, primidone Certain medications for sleep Ciprofloxacin Estrogen or progestin hormones Fluvoxamine General anesthetics, such as halothane, isoflurane, methoxyflurane, propofol Medications for blood pressure Medications that relax muscles for surgery Metoclopramide Opioid medications for pain Rifampin Tobacco This list may not describe all possible interactions. Give your health care provider a list of all the medicines, herbs, non-prescription drugs, or dietary supplements you use. Also tell them if you smoke, drink alcohol, or use illegal drugs. Some items may interact with your medicine. What should I watch for while using this medication? Visit your care team for regular checks on your progress. Tell your care team if your symptoms do not start to get better or if they get worse. Do not suddenly stop taking this medication. You may develop a severe reaction. Your care team will tell you how much medication to take. If your care team wants you to stop the medication, the dose may be slowly lowered over time to avoid any side effects. This medication may affect your coordination, reaction time, or judgement. Do not drive or operate machinery until you know how this medication affects you. Sit up or stand slowly to reduce the risk of dizzy or fainting spells. Drinking alcohol with this medication can increase the risk of these side effects. When taking this medication, you may fall asleep without notice. You may be doing activities, such as driving a car, talking, or eating. You may not feel drowsy before it happens. Contact your care team right away if this happens to you. There have been reports  of increased sexual urges or other strong urges, such as gambling while taking this medication. If you experience any of these while taking this medication, you should report this to your care team as soon as possible. Your mouth may get  dry. Chewing sugarless gum or sucking hard candy and drinking plenty of water may help. Contact your care team if the problem does not go away or is severe. What side effects may I notice from receiving this medication? Side effects that you should report to your care team as soon as possible: Allergic reactions--skin rash, itching, hives, swelling of the face, lips, tongue, or throat Falling asleep during daily activities Low blood pressure--dizziness, feeling faint or lightheaded, blurry vision Mood and behavior changes--anxiety, nervousness, irritability and restlessness, confusion, hallucinations, feeling distrust or suspicion of others New or worsening uncontrolled and repetitive movements of the face, mouth, or upper body Slow heartbeat--dizziness, feeling faint or lightheaded, confusion, trouble breathing, unusual weakness or fatigue Urges to engage in impulsive behaviors such as gambling, binge eating, sexual activity, or shopping in ways that are unusual for you Side effects that usually do not require medical attention (report to your care team if they continue or are bothersome): Dizziness Drowsiness Nausea Swelling of the ankles, hands, or feet Unusual weakness or fatigue Upset stomach Vomiting This list may not describe all possible side effects. Call your doctor for medical advice about side effects. You may report side effects to FDA at 1-800-FDA-1088. Where should I keep my medication? Keep out of the reach of children and pets. Store at room temperature between 20 and 25 degrees C (68 and 77 degrees F). Protect from light and moisture. Keep the container tightly closed. Get rid of any unused medication after the expiration date. To get rid of medications that are no longer needed or have expired: Take the medication to a take-back program. Check with your pharmacy or law enforcement to find a location. If you cannot return the medication, check the label or package insert to  see if the medication should be thrown out in the garbage or flushed down the toilet. If you are not sure, ask your care team. If it is safe to put it in the trash, empty the medication out of the container. Mix the medication with cat litter, dirt, coffee grounds, or other unwanted substance. Seal the mixture in a bag or container. Put it in the trash. NOTE: This sheet is a summary. It may not cover all possible information. If you have questions about this medicine, talk to your doctor, pharmacist, or health care provider.  2024 Elsevier/Gold Standard (2021-04-04 00:00:00)

## 2023-02-10 ENCOUNTER — Ambulatory Visit: Payer: Medicare PPO | Admitting: Nurse Practitioner

## 2023-02-10 ENCOUNTER — Telehealth: Payer: Self-pay | Admitting: Nurse Practitioner

## 2023-02-10 NOTE — Telephone Encounter (Signed)
Patient said you were changing amLODipine from 5MG  to 10 MG. Pharm:  [id number] [name], [place] - [address] AT [facility] [address] [place], [place] [address]

## 2023-02-11 ENCOUNTER — Other Ambulatory Visit: Payer: Self-pay | Admitting: Nurse Practitioner

## 2023-02-11 MED ORDER — LISINOPRIL 10 MG PO TABS: ORAL_TABLET | ORAL | 3 refills | Status: AC

## 2023-02-17 ENCOUNTER — Ambulatory Visit: Payer: Medicare PPO | Admitting: Nurse Practitioner

## 2023-03-10 ENCOUNTER — Telehealth: Payer: Self-pay

## 2023-03-10 NOTE — Telephone Encounter (Signed)
 Mailed patient's medication list to her verified home address per patient request.

## 2023-03-29 DIAGNOSIS — R051 Acute cough: Secondary | ICD-10-CM | POA: Diagnosis not present

## 2023-03-29 DIAGNOSIS — J019 Acute sinusitis, unspecified: Secondary | ICD-10-CM | POA: Diagnosis not present

## 2023-03-29 DIAGNOSIS — R0981 Nasal congestion: Secondary | ICD-10-CM | POA: Diagnosis not present

## 2023-04-01 ENCOUNTER — Ambulatory Visit
Admission: RE | Admit: 2023-04-01 | Discharge: 2023-04-01 | Disposition: A | Discharge status: Home / Self Care | Source: Ambulatory Visit | Attending: Nurse Practitioner | Admitting: Nurse Practitioner

## 2023-04-01 DIAGNOSIS — Z1231 Encounter for screening mammogram for malignant neoplasm of breast: Secondary | ICD-10-CM | POA: Diagnosis not present

## 2023-04-15 DIAGNOSIS — G4452 New daily persistent headache (NDPH): Secondary | ICD-10-CM | POA: Diagnosis not present

## 2023-04-15 DIAGNOSIS — I10 Essential (primary) hypertension: Secondary | ICD-10-CM | POA: Diagnosis not present

## 2023-04-15 DIAGNOSIS — R030 Elevated blood-pressure reading, without diagnosis of hypertension: Secondary | ICD-10-CM | POA: Diagnosis not present

## 2023-04-15 DIAGNOSIS — R519 Headache, unspecified: Secondary | ICD-10-CM | POA: Diagnosis not present

## 2023-04-22 DIAGNOSIS — I10 Essential (primary) hypertension: Secondary | ICD-10-CM | POA: Diagnosis not present

## 2023-04-22 DIAGNOSIS — R519 Headache, unspecified: Secondary | ICD-10-CM | POA: Diagnosis not present

## 2023-05-01 ENCOUNTER — Encounter: Payer: Medicare PPO | Admitting: Nurse Practitioner

## 2023-05-05 ENCOUNTER — Other Ambulatory Visit: Payer: Self-pay | Admitting: Family Medicine

## 2023-05-05 DIAGNOSIS — R519 Headache, unspecified: Secondary | ICD-10-CM

## 2023-05-11 IMAGING — MG MM DIGITAL SCREENING BILAT W/ TOMO AND CAD
6 of 10 series · 6 of 30 positions shown · non-contrast
Comparison: Previous exam(s).

CLINICAL DATA: Screening.

EXAM:
DIGITAL SCREENING BILATERAL MAMMOGRAM WITH TOMOSYNTHESIS AND CAD
TECHNIQUE: Bilateral screening digital craniocaudal and mediolateral oblique
mammograms were obtained. Bilateral screening digital breast
tomosynthesis was performed. The images were evaluated with
computer-aided detection.

[L MLO synth-2D]
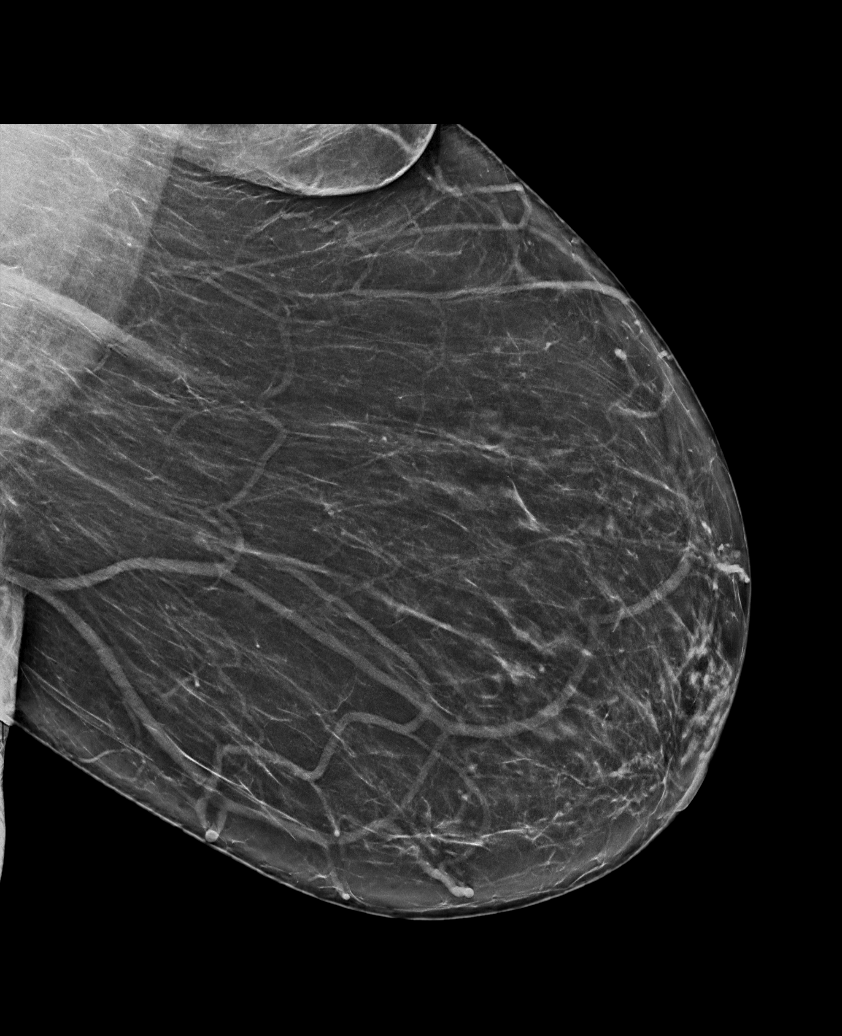

[R MLO synth-2D (1 of 2)]
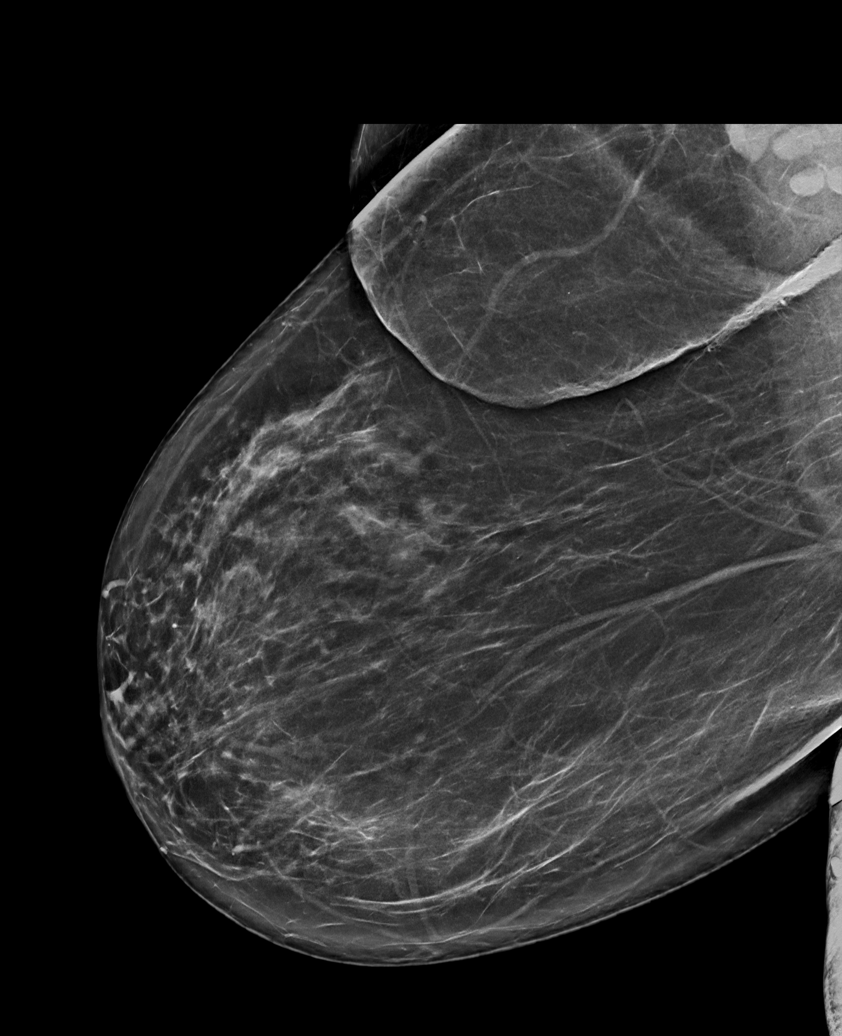

[L CC synth-2D]
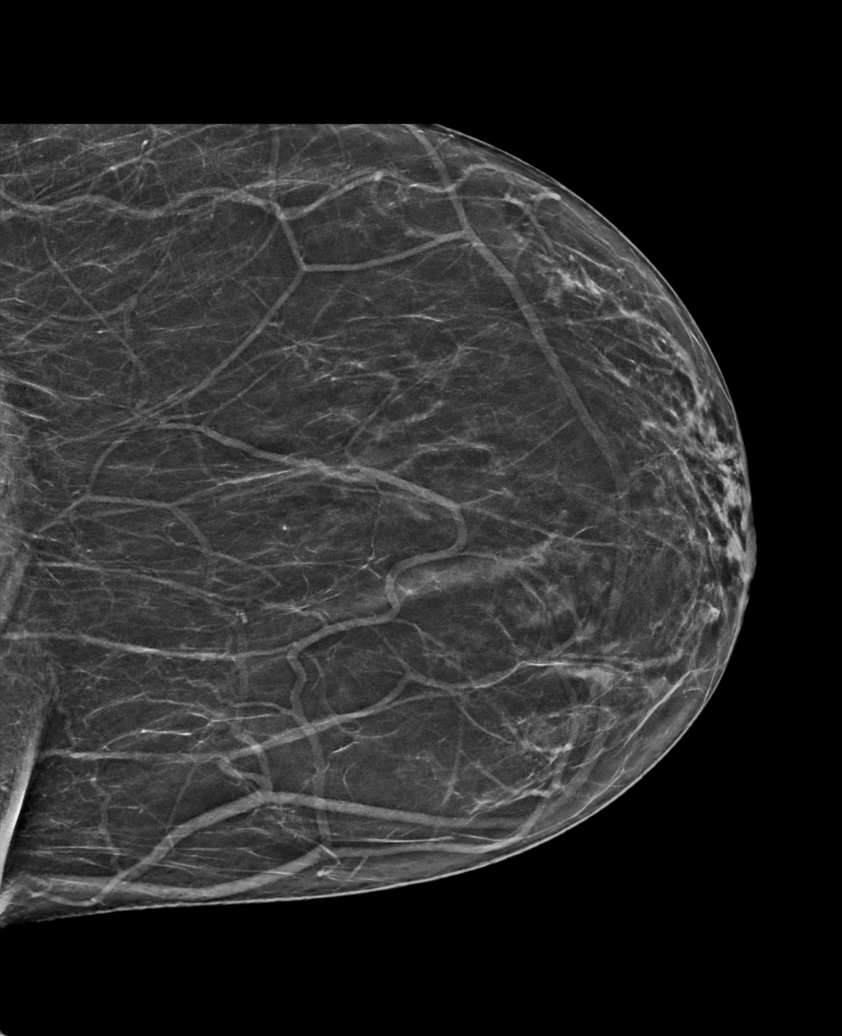

[R CC synth-2D]
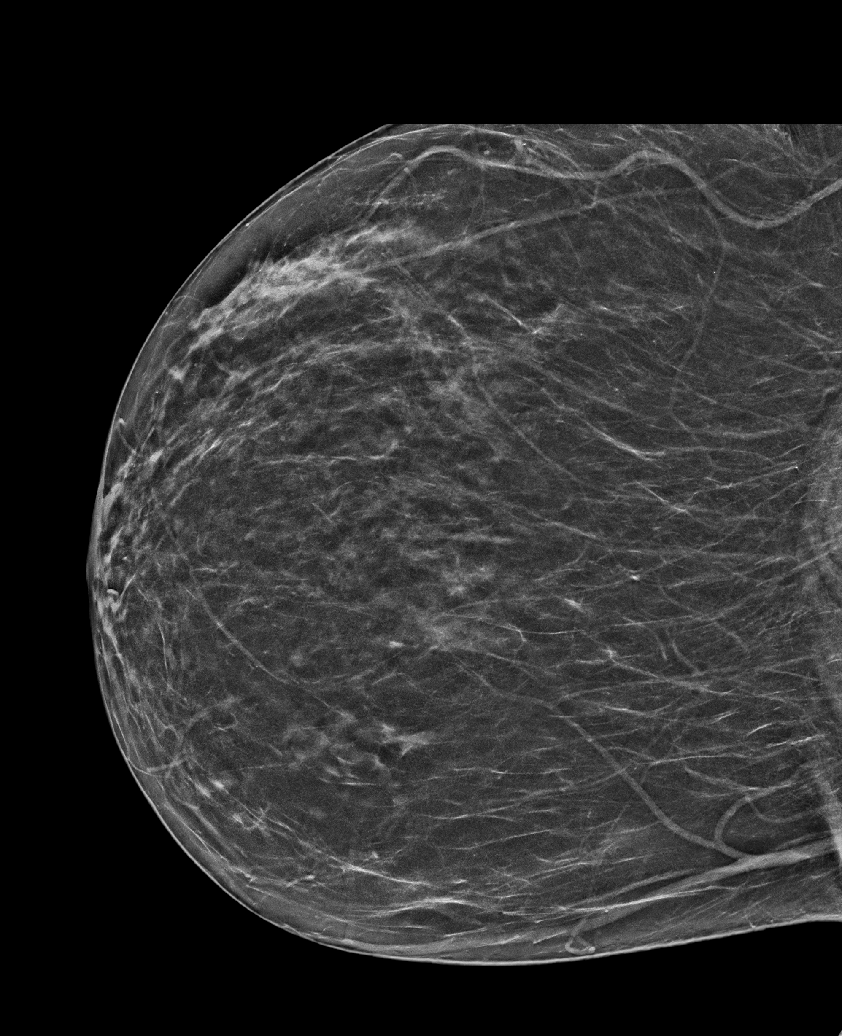

[R MLO synth-2D (2 of 2)]
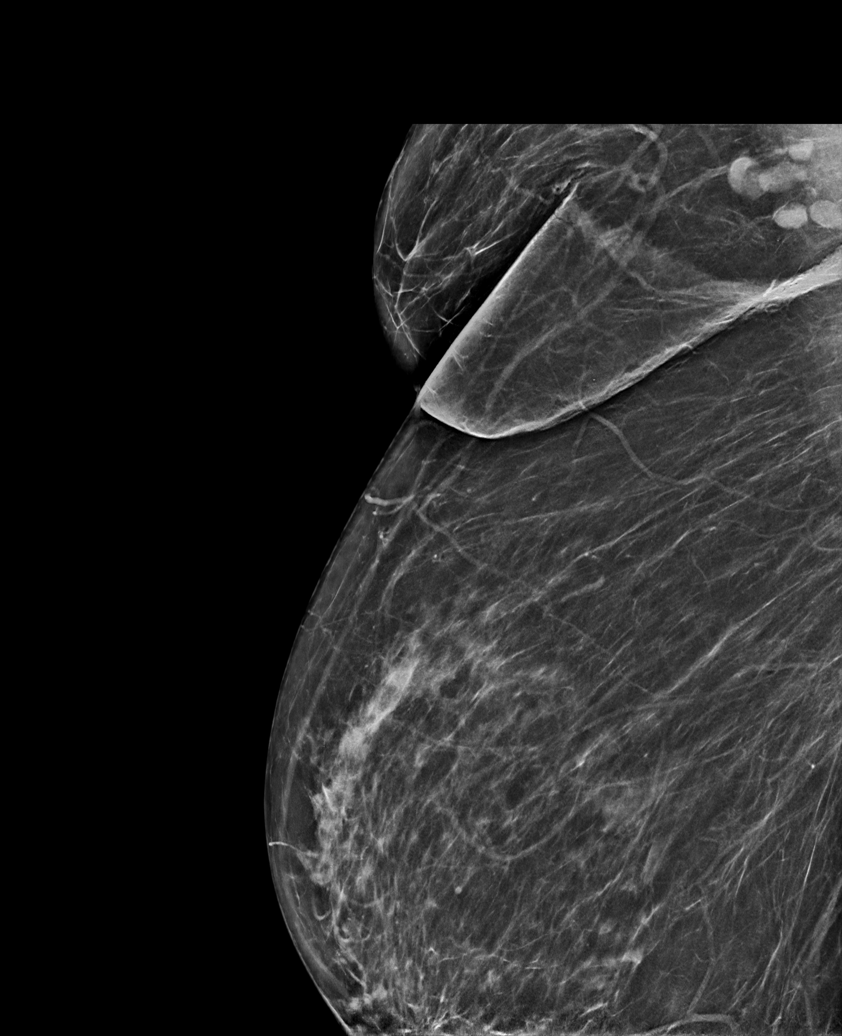

[R MLO tomo · tomo slice 43/86.0]
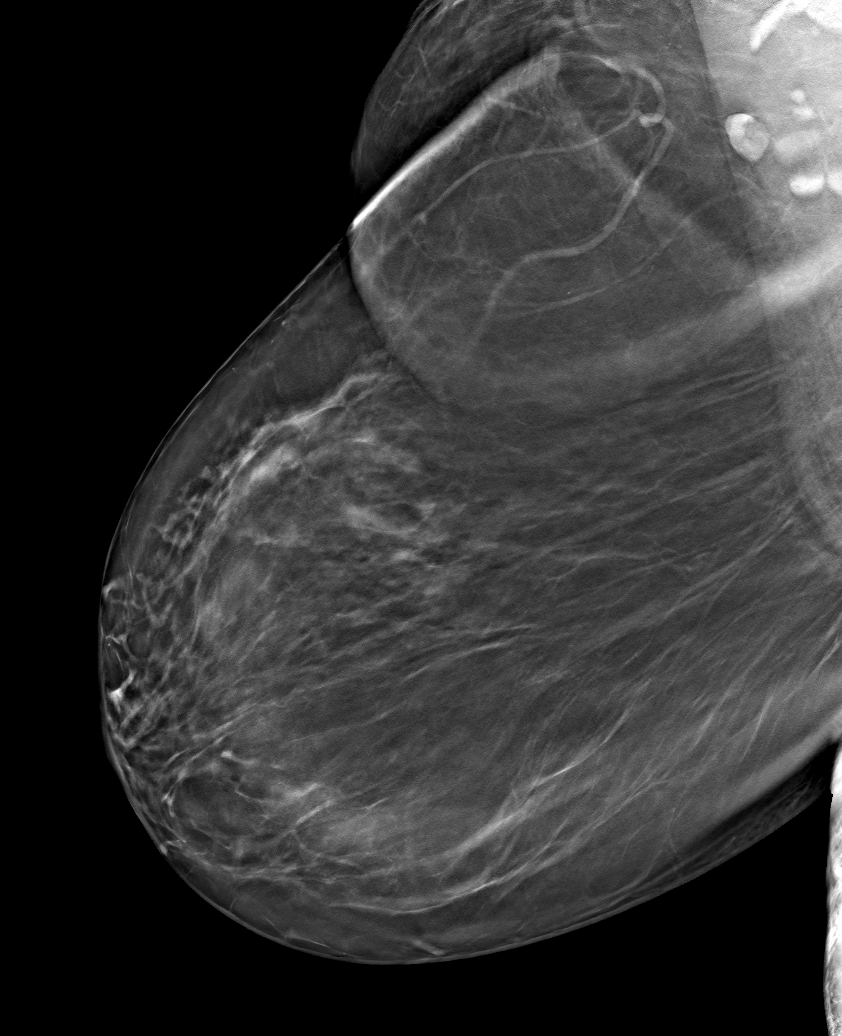

[6 of 30 positions shown; findings below may reference images not displayed]

ACR Breast Density Category b: There are scattered areas of
fibroglandular density.
FINDINGS: There are no findings suspicious for malignancy.
IMPRESSION: No mammographic evidence of malignancy. A result letter of this
screening mammogram will be mailed directly to the patient.

RECOMMENDATION:
Screening mammogram in one year. (Code:51-O-LD2)

BI-RADS CATEGORY  1: Negative.

## 2023-05-20 DIAGNOSIS — I10 Essential (primary) hypertension: Secondary | ICD-10-CM | POA: Diagnosis not present

## 2023-05-20 DIAGNOSIS — R519 Headache, unspecified: Secondary | ICD-10-CM | POA: Diagnosis not present

## 2023-06-10 ENCOUNTER — Ambulatory Visit
Admission: RE | Admit: 2023-06-10 | Discharge: 2023-06-10 | Disposition: A | Discharge status: Home / Self Care | Source: Ambulatory Visit | Attending: Family Medicine

## 2023-06-10 DIAGNOSIS — R519 Headache, unspecified: Secondary | ICD-10-CM | POA: Diagnosis not present

## 2023-06-10 DIAGNOSIS — G319 Degenerative disease of nervous system, unspecified: Secondary | ICD-10-CM | POA: Diagnosis not present

## 2023-06-10 DIAGNOSIS — I6782 Cerebral ischemia: Secondary | ICD-10-CM | POA: Diagnosis not present

## 2023-06-10 MED ORDER — GADOPICLENOL 0.5 MMOL/ML IV SOLN
9.0000 mL | Freq: Once | INTRAVENOUS | Status: AC | PRN
  Administered 2023-06-10: 9 mL via INTRAVENOUS

## 2023-06-20 DIAGNOSIS — R519 Headache, unspecified: Secondary | ICD-10-CM | POA: Diagnosis not present

## 2023-06-20 DIAGNOSIS — E119 Type 2 diabetes mellitus without complications: Secondary | ICD-10-CM | POA: Diagnosis not present

## 2023-06-20 DIAGNOSIS — E1122 Type 2 diabetes mellitus with diabetic chronic kidney disease: Secondary | ICD-10-CM | POA: Diagnosis not present

## 2023-06-20 DIAGNOSIS — I10 Essential (primary) hypertension: Secondary | ICD-10-CM | POA: Diagnosis not present

## 2023-06-20 DIAGNOSIS — M109 Gout, unspecified: Secondary | ICD-10-CM | POA: Diagnosis not present

## 2023-06-20 DIAGNOSIS — E1169 Type 2 diabetes mellitus with other specified complication: Secondary | ICD-10-CM | POA: Diagnosis not present

## 2023-07-27 DIAGNOSIS — I10 Essential (primary) hypertension: Secondary | ICD-10-CM | POA: Diagnosis not present

## 2023-07-28 DIAGNOSIS — I10 Essential (primary) hypertension: Secondary | ICD-10-CM | POA: Diagnosis not present

## 2023-07-31 ENCOUNTER — Ambulatory Visit: Payer: Medicare PPO | Admitting: Internal Medicine

## 2023-08-04 DIAGNOSIS — Z79899 Other long term (current) drug therapy: Secondary | ICD-10-CM | POA: Diagnosis not present

## 2023-08-04 DIAGNOSIS — I10 Essential (primary) hypertension: Secondary | ICD-10-CM | POA: Diagnosis not present

## 2023-08-21 DIAGNOSIS — H6992 Unspecified Eustachian tube disorder, left ear: Secondary | ICD-10-CM | POA: Diagnosis not present

## 2023-08-25 IMAGING — CR DG HAND COMPLETE 3+V*R*
3 series · 3 of 3 positions shown · non-contrast
Comparison: None.

CLINICAL DATA: Right hand pain, 4th digit palmar mass at MCP (very
firm--question bony enlargement). Also thumb metacarpal pain.

EXAM:
RIGHT HAND - COMPLETE 3+ VIEW

[x hand pa right]
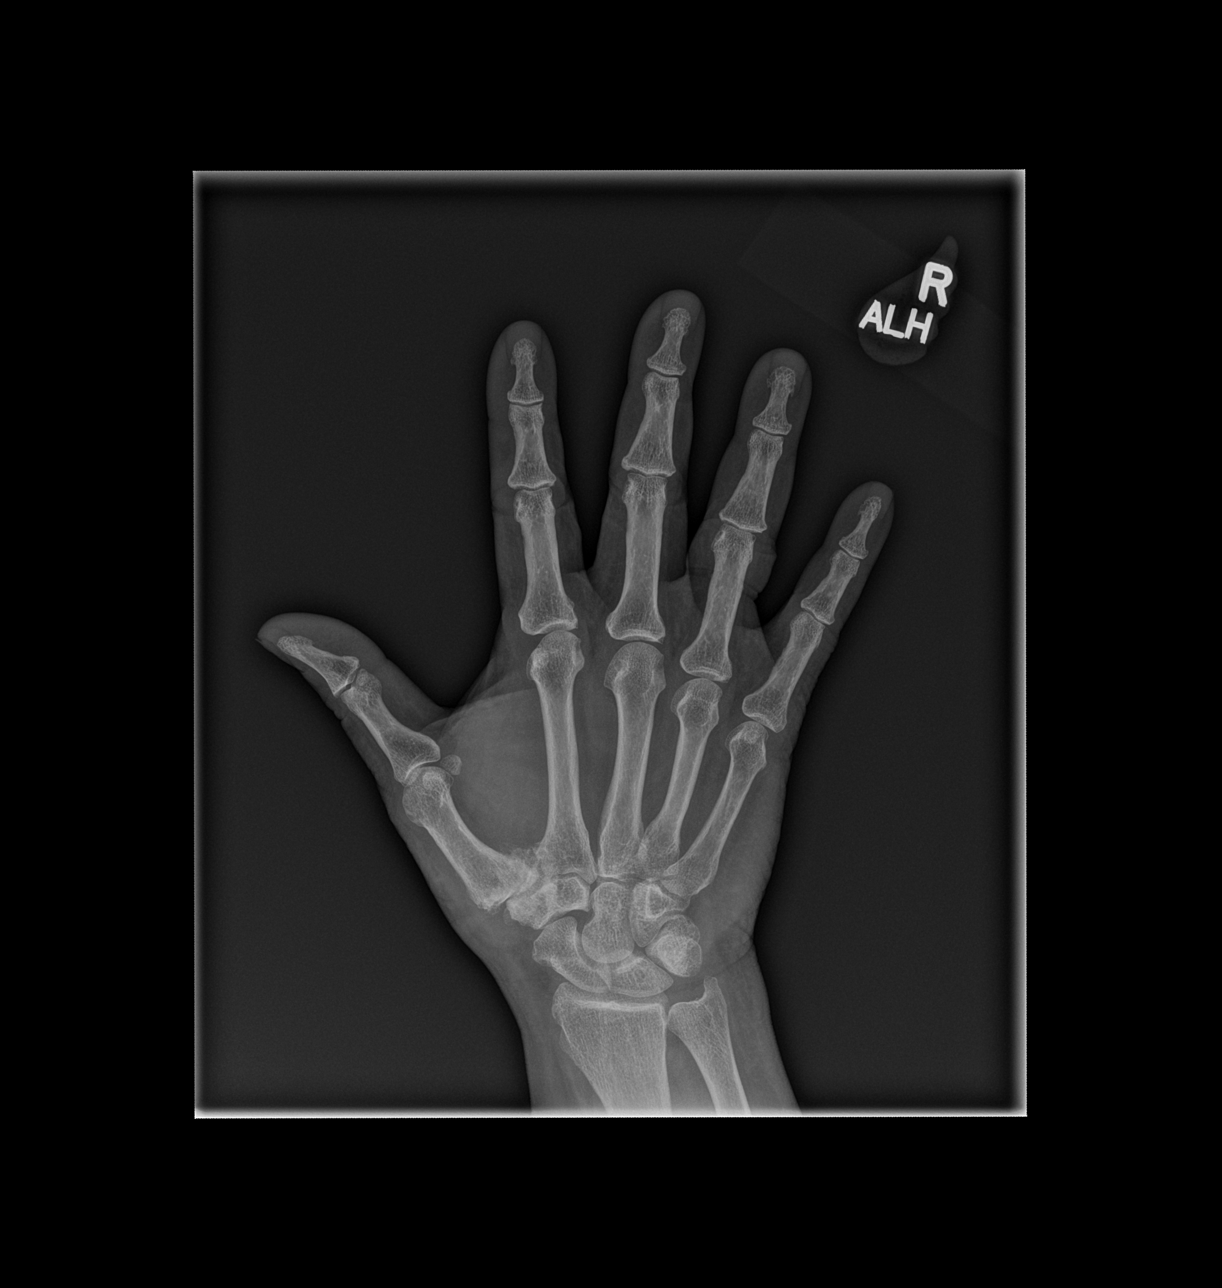

[x hand obl right]
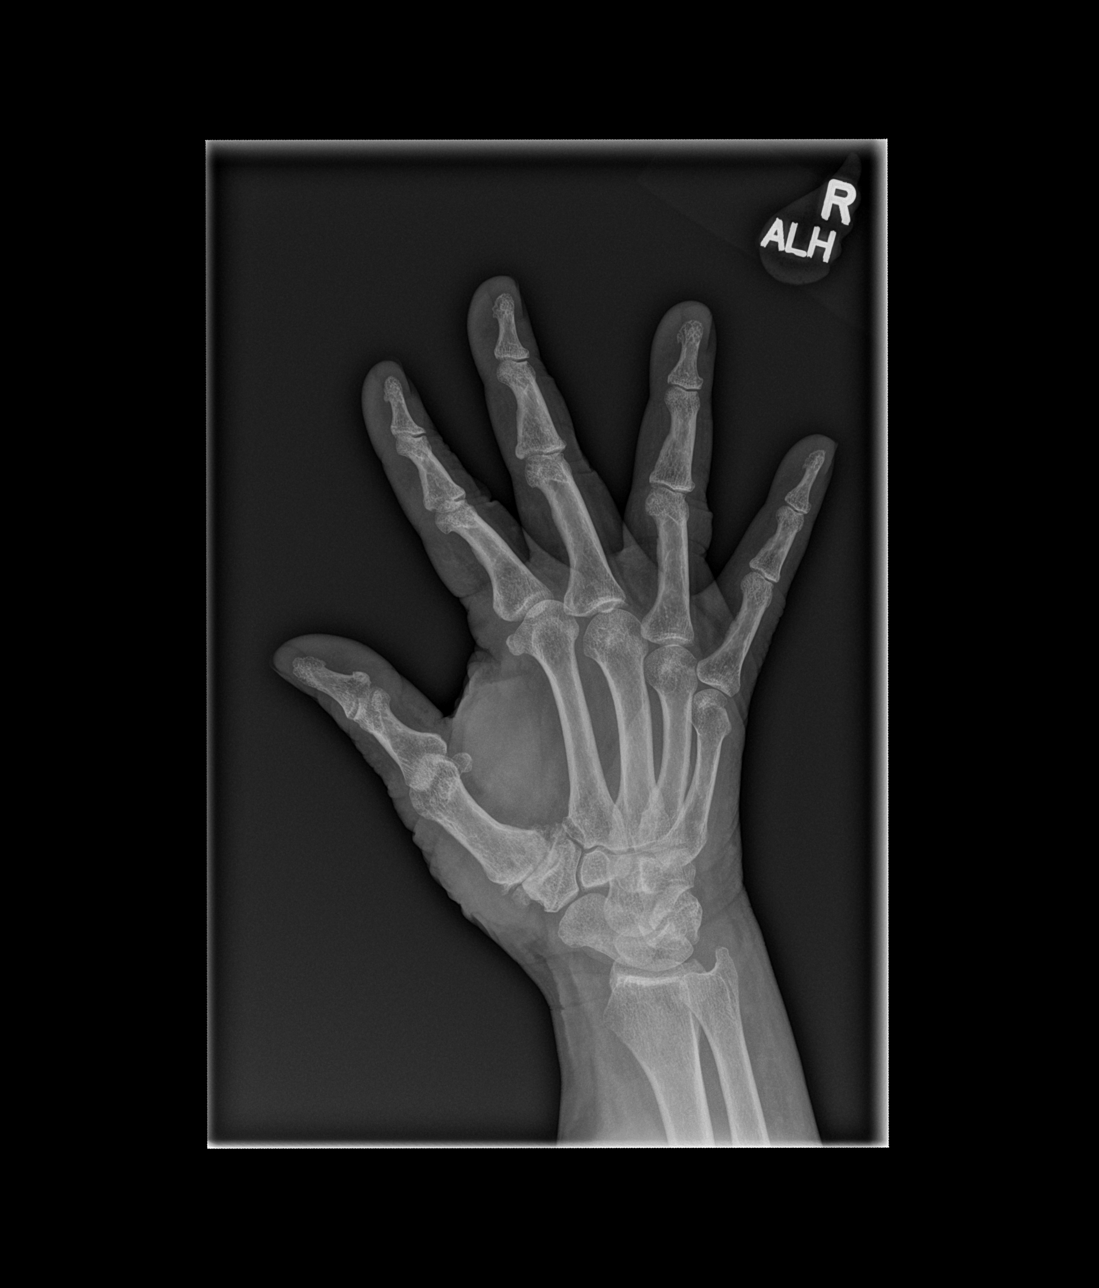

[x hand lat right]
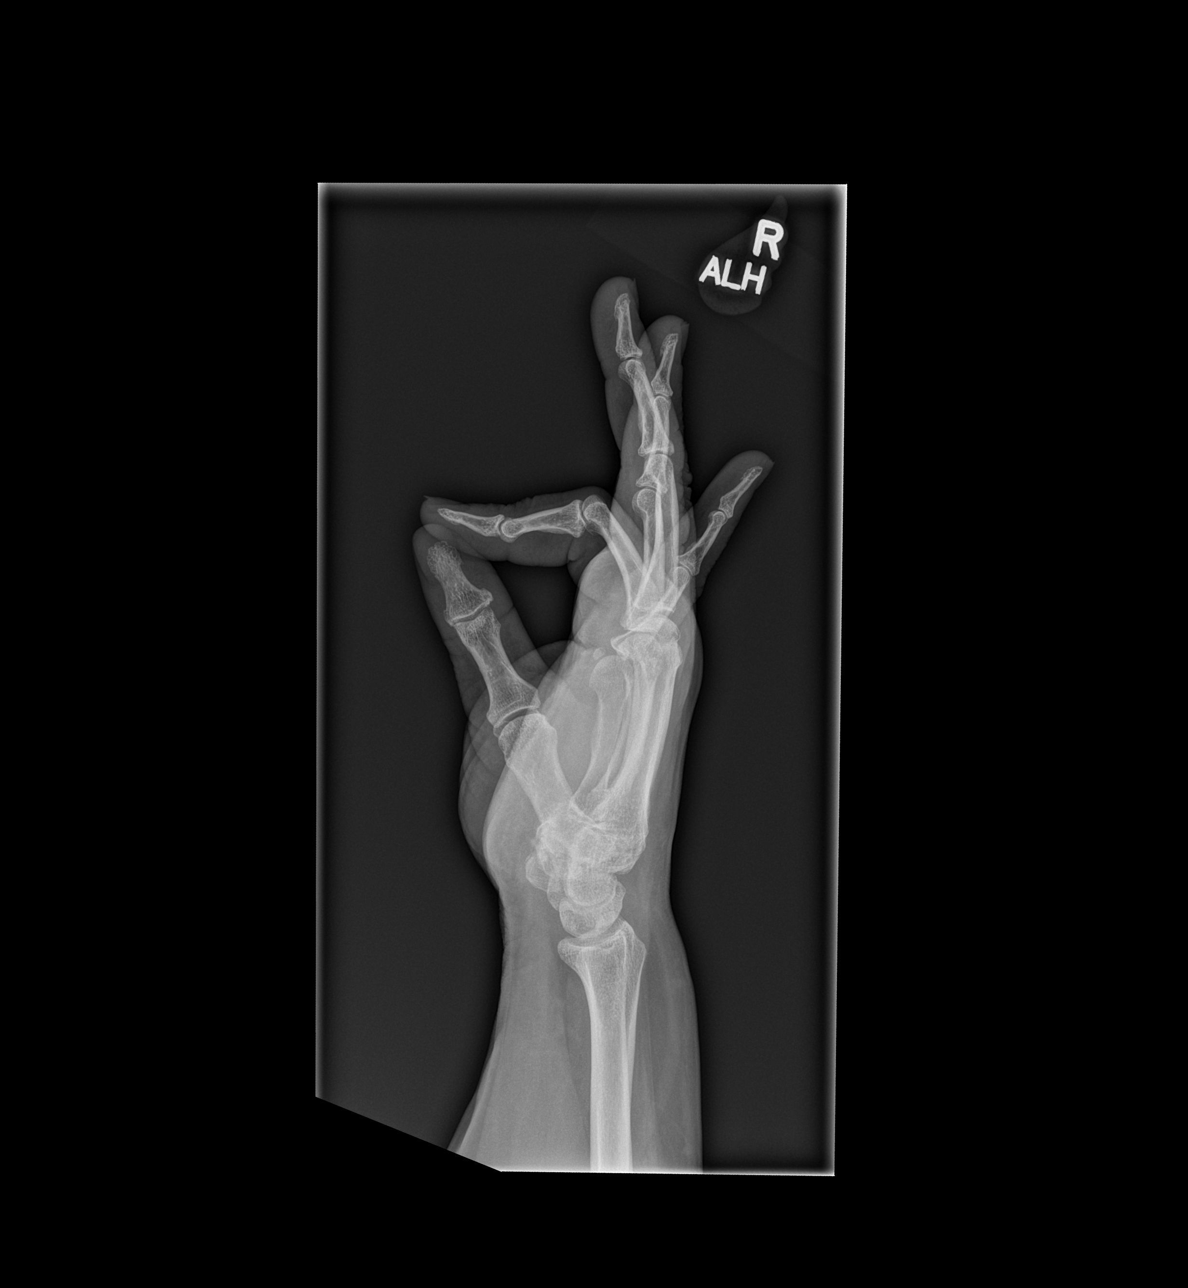

[3 of 3 positions shown; findings below may reference images not displayed]

FINDINGS: Normal bone mineralization. Moderate triscaphe joint and severe
thumb metacarpal joint space narrowing. Moderate to high-grade
peripheral thumb carpometacarpal degenerative osteophytes. Mild
thumb interphalangeal joint space narrowing.

No significant bone or soft tissue abnormality is visualized at the
fourth metacarpophalangeal joint.

No acute fracture or dislocation.
IMPRESSION: Severe thumb carpometacarpal and moderate triscaphe osteoarthritis.

Mild thumb interphalangeal joint space narrowing.

No significant bone or radiopaque soft tissue abnormality is seen in
the region of fourth carpometacarpal joint.

## 2023-09-16 DIAGNOSIS — Z23 Encounter for immunization: Secondary | ICD-10-CM | POA: Diagnosis not present

## 2023-09-16 DIAGNOSIS — I10 Essential (primary) hypertension: Secondary | ICD-10-CM | POA: Diagnosis not present

## 2023-09-16 DIAGNOSIS — E1169 Type 2 diabetes mellitus with other specified complication: Secondary | ICD-10-CM | POA: Diagnosis not present

## 2023-09-16 DIAGNOSIS — E669 Obesity, unspecified: Secondary | ICD-10-CM | POA: Diagnosis not present

## 2023-09-16 DIAGNOSIS — E1122 Type 2 diabetes mellitus with diabetic chronic kidney disease: Secondary | ICD-10-CM | POA: Diagnosis not present

## 2023-09-16 DIAGNOSIS — Z0000 Encounter for general adult medical examination without abnormal findings: Secondary | ICD-10-CM | POA: Diagnosis not present

## 2024-01-20 ENCOUNTER — Encounter: Payer: Self-pay | Admitting: Podiatry

## 2024-01-20 ENCOUNTER — Ambulatory Visit: Admitting: Podiatry

## 2024-01-20 DIAGNOSIS — L600 Ingrowing nail: Secondary | ICD-10-CM

## 2024-01-20 NOTE — Patient Instructions (Signed)

## 2024-01-20 NOTE — Progress Notes (Signed)
 Patient complains of painful ingrown borders hallux bilaterally.  It has been bothering her for the past year or 2 and getting worse.. Patient denies fevers, chills, nausea, vomiting.  Objective:  Vitals: Reviewed  General: Well developed, nourished, in no acute distress, alert and oriented x3   Vascular: DP pulse 2/4 bilateral. PT pulse 2/4 bilateral.  Mild edema toe with ingrown nail.  Capillary refill time immediate bilaterally  Dermatology: Erythema, edema, incurvated nail border hallux bilaterally with no drainage . Tenderness present with palpation. Norma no l skin tone and texture feet with normal hair growth.  Neurological: Grossly intact. Normal reflexes.   Musculoskeletal: Tenderness with palpation of the distal hallux bilaterally. No tenderness or painful ROM at IPJ.  Diagnosis: 1.  Ingrown nail both borders hallux bilaterally  Plan: -discussed etiology and treatment of ingrown nails. Discussed surgical vs conservative treatment.  Discussed removal permanent removal of the borders versus permanent removal of the entire entire nail patient would like to do the borders.  Will do the hallux nail borders left today and the right in 2 weeks.-Consent signed for appropriate matrixectomy affected nail(s).   Procedure(s):   - Matrixectomy(s) nail borders hallux left: Toe anesthetized with 3cc 2:1 mixture 2% Lidocaine  with epinephrine : Sodium Bicarbonate. Surgical site prepped. Digital tourniquet applied.  Avulsion of nail borders. performed. Matrixecomy performed with three 30 second applications of phenol to nail matrix. Site irrigated with alcohol.  Tourniquet released with good vascularity noticed in digit.  Applied triple antibiotic to nailbed and applied gauze and Coban dressing. - Written and oral postoperative instructions given.  -Return for post-op 2 weeks.  And matrixectomy nail borders hallux right  JINNY Prentice Binder, DPM

## 2024-02-03 ENCOUNTER — Ambulatory Visit: Admitting: Podiatry

## 2024-02-10 ENCOUNTER — Ambulatory Visit: Admitting: Podiatry

## 2024-02-10 ENCOUNTER — Encounter: Payer: Self-pay | Admitting: Podiatry

## 2024-02-10 DIAGNOSIS — L600 Ingrowing nail: Secondary | ICD-10-CM

## 2024-02-10 NOTE — Patient Instructions (Signed)

## 2024-02-24 ENCOUNTER — Ambulatory Visit: Admitting: Podiatry
